# Patient Record
Sex: Female | Born: 1945
Health system: Southern US, Community
[De-identification: ages and names within clinical notes are randomized; demographics above are authoritative.]

## PROBLEM LIST (undated history)

## (undated) DIAGNOSIS — G459 Transient cerebral ischemic attack, unspecified: Secondary | ICD-10-CM

## (undated) DIAGNOSIS — I10 Essential (primary) hypertension: Secondary | ICD-10-CM

## (undated) DIAGNOSIS — Z7982 Long term (current) use of aspirin: Secondary | ICD-10-CM

## (undated) DIAGNOSIS — K589 Irritable bowel syndrome without diarrhea: Secondary | ICD-10-CM

## (undated) DIAGNOSIS — K582 Mixed irritable bowel syndrome: Secondary | ICD-10-CM

## (undated) DIAGNOSIS — E785 Hyperlipidemia, unspecified: Secondary | ICD-10-CM

## (undated) DIAGNOSIS — E669 Obesity, unspecified: Secondary | ICD-10-CM

## (undated) DIAGNOSIS — M1990 Unspecified osteoarthritis, unspecified site: Secondary | ICD-10-CM

## (undated) DIAGNOSIS — K635 Polyp of colon: Secondary | ICD-10-CM

## (undated) DIAGNOSIS — N281 Cyst of kidney, acquired: Secondary | ICD-10-CM

## (undated) DIAGNOSIS — E279 Disorder of adrenal gland, unspecified: Secondary | ICD-10-CM

## (undated) DIAGNOSIS — J449 Chronic obstructive pulmonary disease, unspecified: Secondary | ICD-10-CM

## (undated) DIAGNOSIS — D649 Anemia, unspecified: Secondary | ICD-10-CM

## (undated) DIAGNOSIS — M5135 Other intervertebral disc degeneration, thoracolumbar region: Secondary | ICD-10-CM

## (undated) DIAGNOSIS — C50912 Malignant neoplasm of unspecified site of left female breast: Secondary | ICD-10-CM

## (undated) DIAGNOSIS — Z87442 Personal history of urinary calculi: Secondary | ICD-10-CM

## (undated) DIAGNOSIS — I272 Pulmonary hypertension, unspecified: Secondary | ICD-10-CM

## (undated) DIAGNOSIS — Z923 Personal history of irradiation: Secondary | ICD-10-CM

## (undated) DIAGNOSIS — R600 Localized edema: Secondary | ICD-10-CM

## (undated) DIAGNOSIS — G4733 Obstructive sleep apnea (adult) (pediatric): Secondary | ICD-10-CM

## (undated) DIAGNOSIS — K5790 Diverticulosis of intestine, part unspecified, without perforation or abscess without bleeding: Secondary | ICD-10-CM

## (undated) DIAGNOSIS — G4700 Insomnia, unspecified: Secondary | ICD-10-CM

## (undated) DIAGNOSIS — I700 Atherosclerosis of aorta: Secondary | ICD-10-CM

## (undated) DIAGNOSIS — R0600 Dyspnea, unspecified: Secondary | ICD-10-CM

## (undated) DIAGNOSIS — K4090 Unilateral inguinal hernia, without obstruction or gangrene, not specified as recurrent: Secondary | ICD-10-CM

## (undated) DIAGNOSIS — C50919 Malignant neoplasm of unspecified site of unspecified female breast: Secondary | ICD-10-CM

## (undated) DIAGNOSIS — E119 Type 2 diabetes mellitus without complications: Secondary | ICD-10-CM

## (undated) DIAGNOSIS — M51369 Other intervertebral disc degeneration, lumbar region without mention of lumbar back pain or lower extremity pain: Secondary | ICD-10-CM

## (undated) DIAGNOSIS — I350 Nonrheumatic aortic (valve) stenosis: Secondary | ICD-10-CM

## (undated) DIAGNOSIS — D3502 Benign neoplasm of left adrenal gland: Secondary | ICD-10-CM

## (undated) DIAGNOSIS — M5136 Other intervertebral disc degeneration, lumbar region: Secondary | ICD-10-CM

## (undated) DIAGNOSIS — K760 Fatty (change of) liver, not elsewhere classified: Secondary | ICD-10-CM

## (undated) DIAGNOSIS — G4730 Sleep apnea, unspecified: Secondary | ICD-10-CM

## (undated) HISTORY — DX: Malignant neoplasm of unspecified site of unspecified female breast: C50.919

## (undated) HISTORY — PX: CHOLECYSTECTOMY: SHX55

## (undated) HISTORY — DX: Essential (primary) hypertension: I10

## (undated) HISTORY — DX: Type 2 diabetes mellitus without complications: E11.9

## (undated) HISTORY — DX: Unspecified osteoarthritis, unspecified site: M19.90

## (undated) HISTORY — DX: Hyperlipidemia, unspecified: E78.5

## (undated) HISTORY — DX: Chronic obstructive pulmonary disease, unspecified: J44.9

## (undated) HISTORY — DX: Malignant neoplasm of unspecified site of left female breast: C50.912

## (undated) HISTORY — DX: Transient cerebral ischemic attack, unspecified: G45.9

## (undated) HISTORY — PX: KNEE ARTHROSCOPY W/ MENISCAL REPAIR: SHX1877

## (undated) HISTORY — PX: ABDOMINAL HYSTERECTOMY: SHX81

## (undated) HISTORY — PX: COLONOSCOPY W/ POLYPECTOMY: SHX1380

## (undated) HISTORY — PX: TONSILLECTOMY: SUR1361

---

## 2005-03-02 ENCOUNTER — Ambulatory Visit: Payer: Self-pay | Admitting: Rheumatology

## 2005-04-10 ENCOUNTER — Ambulatory Visit: Payer: Self-pay | Admitting: Unknown Physician Specialty

## 2005-07-22 ENCOUNTER — Ambulatory Visit: Payer: Self-pay | Admitting: Family Medicine

## 2006-06-24 ENCOUNTER — Ambulatory Visit: Payer: Self-pay | Admitting: Unknown Physician Specialty

## 2006-07-14 ENCOUNTER — Ambulatory Visit: Payer: Self-pay | Admitting: Unknown Physician Specialty

## 2006-08-12 ENCOUNTER — Ambulatory Visit: Payer: Self-pay | Admitting: Family Medicine

## 2006-09-30 ENCOUNTER — Ambulatory Visit: Payer: Self-pay | Admitting: Unknown Physician Specialty

## 2007-06-14 DIAGNOSIS — G459 Transient cerebral ischemic attack, unspecified: Secondary | ICD-10-CM

## 2007-06-14 HISTORY — DX: Transient cerebral ischemic attack, unspecified: G45.9

## 2007-06-27 ENCOUNTER — Other Ambulatory Visit: Payer: Self-pay

## 2007-06-27 ENCOUNTER — Inpatient Hospital Stay: Payer: Self-pay | Admitting: Internal Medicine

## 2008-02-22 ENCOUNTER — Ambulatory Visit: Payer: Self-pay | Admitting: Family Medicine

## 2008-04-03 ENCOUNTER — Emergency Department: Payer: Self-pay | Admitting: Unknown Physician Specialty

## 2008-12-05 ENCOUNTER — Ambulatory Visit: Payer: Self-pay | Admitting: Unknown Physician Specialty

## 2009-02-22 ENCOUNTER — Ambulatory Visit: Payer: Self-pay | Admitting: Family Medicine

## 2009-12-14 DIAGNOSIS — C50919 Malignant neoplasm of unspecified site of unspecified female breast: Secondary | ICD-10-CM

## 2009-12-14 DIAGNOSIS — C50912 Malignant neoplasm of unspecified site of left female breast: Secondary | ICD-10-CM

## 2009-12-14 HISTORY — DX: Malignant neoplasm of unspecified site of left female breast: C50.912

## 2009-12-14 HISTORY — PX: BREAST EXCISIONAL BIOPSY: SUR124

## 2009-12-14 HISTORY — DX: Malignant neoplasm of unspecified site of unspecified female breast: C50.919

## 2010-03-05 ENCOUNTER — Ambulatory Visit: Payer: Self-pay | Admitting: Family Medicine

## 2010-03-11 ENCOUNTER — Ambulatory Visit: Payer: Self-pay | Admitting: Family Medicine

## 2010-04-09 ENCOUNTER — Ambulatory Visit: Payer: Self-pay | Admitting: Surgery

## 2010-04-13 ENCOUNTER — Ambulatory Visit: Payer: Self-pay | Admitting: Internal Medicine

## 2010-04-16 ENCOUNTER — Ambulatory Visit: Payer: Self-pay | Admitting: Surgery

## 2010-04-22 ENCOUNTER — Ambulatory Visit: Payer: Self-pay | Admitting: Surgery

## 2010-05-08 ENCOUNTER — Ambulatory Visit: Payer: Self-pay | Admitting: Internal Medicine

## 2010-05-14 ENCOUNTER — Ambulatory Visit: Payer: Self-pay | Admitting: Internal Medicine

## 2010-06-13 ENCOUNTER — Ambulatory Visit: Payer: Self-pay | Admitting: Internal Medicine

## 2010-07-14 ENCOUNTER — Ambulatory Visit: Payer: Self-pay | Admitting: Internal Medicine

## 2010-08-14 ENCOUNTER — Ambulatory Visit: Payer: Self-pay | Admitting: Internal Medicine

## 2010-08-14 LAB — CANCER ANTIGEN 27.29: CA 27.29: 25.5 U/mL (ref 0.0–38.6)

## 2010-09-13 ENCOUNTER — Ambulatory Visit: Payer: Self-pay | Admitting: Internal Medicine

## 2010-09-19 ENCOUNTER — Inpatient Hospital Stay: Payer: Self-pay | Admitting: Internal Medicine

## 2010-12-12 ENCOUNTER — Ambulatory Visit: Payer: Self-pay | Admitting: Internal Medicine

## 2010-12-13 LAB — CANCER ANTIGEN 27.29: CA 27.29: 21.1 U/mL (ref 0.0–38.6)

## 2010-12-14 ENCOUNTER — Ambulatory Visit: Payer: Self-pay | Admitting: Internal Medicine

## 2011-02-05 ENCOUNTER — Ambulatory Visit: Payer: Self-pay | Admitting: Internal Medicine

## 2011-02-12 ENCOUNTER — Ambulatory Visit: Payer: Self-pay | Admitting: Internal Medicine

## 2011-03-24 ENCOUNTER — Ambulatory Visit: Payer: Self-pay | Admitting: Surgery

## 2011-04-10 ENCOUNTER — Ambulatory Visit: Payer: Self-pay | Admitting: Internal Medicine

## 2011-04-11 LAB — CANCER ANTIGEN 27.29: CA 27.29: 27.2 U/mL (ref 0.0–38.6)

## 2011-04-14 ENCOUNTER — Ambulatory Visit: Payer: Self-pay | Admitting: Internal Medicine

## 2011-05-15 ENCOUNTER — Ambulatory Visit: Payer: Self-pay

## 2011-05-15 ENCOUNTER — Ambulatory Visit: Payer: Self-pay | Admitting: Internal Medicine

## 2011-08-06 ENCOUNTER — Ambulatory Visit: Payer: Self-pay | Admitting: Internal Medicine

## 2011-08-07 LAB — CANCER ANTIGEN 27.29: CA 27.29: 22.1 U/mL (ref 0.0–38.6)

## 2011-08-15 ENCOUNTER — Ambulatory Visit: Payer: Self-pay | Admitting: Internal Medicine

## 2011-09-28 ENCOUNTER — Ambulatory Visit: Payer: Self-pay | Admitting: Internal Medicine

## 2012-02-09 ENCOUNTER — Ambulatory Visit: Payer: Self-pay | Admitting: Internal Medicine

## 2012-02-09 LAB — COMPREHENSIVE METABOLIC PANEL
Albumin: 4.2 g/dL (ref 3.4–5.0)
Anion Gap: 9 (ref 7–16)
BUN: 17 mg/dL (ref 7–18)
Bilirubin,Total: 0.7 mg/dL (ref 0.2–1.0)
Chloride: 103 mmol/L (ref 98–107)
Co2: 31 mmol/L (ref 21–32)
Creatinine: 1.16 mg/dL (ref 0.60–1.30)
EGFR (African American): 60 — ABNORMAL LOW
EGFR (Non-African Amer.): 49 — ABNORMAL LOW
Glucose: 104 mg/dL — ABNORMAL HIGH (ref 65–99)
Potassium: 3.7 mmol/L (ref 3.5–5.1)
SGOT(AST): 19 U/L (ref 15–37)
Sodium: 143 mmol/L (ref 136–145)
Total Protein: 6.9 g/dL (ref 6.4–8.2)

## 2012-02-09 LAB — CBC CANCER CENTER
Basophil #: 0 x10 3/mm (ref 0.0–0.1)
Basophil %: 0.4 %
Eosinophil %: 1.3 %
HCT: 38.4 % (ref 35.0–47.0)
HGB: 12.9 g/dL (ref 12.0–16.0)
Lymphocyte #: 1 x10 3/mm (ref 1.0–3.6)
MCH: 30.5 pg (ref 26.0–34.0)
MCHC: 33.7 g/dL (ref 32.0–36.0)
MCV: 90 fL (ref 80–100)
Monocyte #: 0.4 x10 3/mm (ref 0.0–0.7)
Neutrophil #: 4.1 x10 3/mm (ref 1.4–6.5)
Neutrophil %: 73 %
RBC: 4.24 10*6/uL (ref 3.80–5.20)

## 2012-02-10 LAB — CANCER ANTIGEN 27.29: CA 27.29: 25.6 U/mL (ref 0.0–38.6)

## 2012-02-12 ENCOUNTER — Ambulatory Visit: Payer: Self-pay | Admitting: Internal Medicine

## 2012-03-24 ENCOUNTER — Ambulatory Visit: Payer: Self-pay | Admitting: Internal Medicine

## 2012-08-05 ENCOUNTER — Ambulatory Visit: Payer: Self-pay | Admitting: Internal Medicine

## 2012-08-09 LAB — CBC CANCER CENTER
Basophil #: 0 x10 3/mm (ref 0.0–0.1)
Eosinophil #: 0.1 x10 3/mm (ref 0.0–0.7)
MCHC: 32.9 g/dL (ref 32.0–36.0)
MCV: 90 fL (ref 80–100)
Monocyte #: 0.4 x10 3/mm (ref 0.2–0.9)
Monocyte %: 8.8 %
Neutrophil #: 3.3 x10 3/mm (ref 1.4–6.5)
Neutrophil %: 70.1 %
Platelet: 235 x10 3/mm (ref 150–440)
RDW: 15.1 % — ABNORMAL HIGH (ref 11.5–14.5)
WBC: 4.7 x10 3/mm (ref 3.6–11.0)

## 2012-08-09 LAB — HEPATIC FUNCTION PANEL A (ARMC)
Albumin: 4.1 g/dL (ref 3.4–5.0)
Alkaline Phosphatase: 76 U/L (ref 50–136)
Bilirubin,Total: 0.7 mg/dL (ref 0.2–1.0)
SGOT(AST): 13 U/L — ABNORMAL LOW (ref 15–37)
SGPT (ALT): 24 U/L (ref 12–78)

## 2012-08-09 LAB — CREATININE, SERUM: EGFR (Non-African Amer.): 52 — ABNORMAL LOW

## 2012-08-10 LAB — CANCER ANTIGEN 27.29: CA 27.29: 32.8 U/mL (ref 0.0–38.6)

## 2012-08-14 ENCOUNTER — Ambulatory Visit: Payer: Self-pay | Admitting: Internal Medicine

## 2012-09-13 ENCOUNTER — Ambulatory Visit: Payer: Self-pay | Admitting: Internal Medicine

## 2013-01-06 ENCOUNTER — Ambulatory Visit: Payer: Self-pay | Admitting: Unknown Physician Specialty

## 2013-01-09 LAB — PATHOLOGY REPORT

## 2013-01-14 ENCOUNTER — Ambulatory Visit: Payer: Self-pay | Admitting: Internal Medicine

## 2013-02-10 LAB — CBC CANCER CENTER
Basophil #: 0 x10 3/mm (ref 0.0–0.1)
Basophil %: 0.4 %
Eosinophil #: 0.1 x10 3/mm (ref 0.0–0.7)
Eosinophil %: 1.9 %
HCT: 40.6 % (ref 35.0–47.0)
HGB: 13.7 g/dL (ref 12.0–16.0)
Lymphocyte #: 1.1 x10 3/mm (ref 1.0–3.6)
Lymphocyte %: 21.7 %
MCH: 30.5 pg (ref 26.0–34.0)
MCHC: 33.9 g/dL (ref 32.0–36.0)
MCV: 90 fL (ref 80–100)
Monocyte #: 0.5 x10 3/mm (ref 0.2–0.9)
Monocyte %: 9.9 %
Platelet: 238 x10 3/mm (ref 150–440)
RDW: 14.3 % (ref 11.5–14.5)

## 2013-02-10 LAB — HEPATIC FUNCTION PANEL A (ARMC)
Albumin: 3.8 g/dL (ref 3.4–5.0)
Bilirubin,Total: 0.6 mg/dL (ref 0.2–1.0)
SGOT(AST): 14 U/L — ABNORMAL LOW (ref 15–37)
Total Protein: 6.9 g/dL (ref 6.4–8.2)

## 2013-02-10 LAB — CREATININE, SERUM
Creatinine: 1.16 mg/dL (ref 0.60–1.30)
EGFR (African American): 56 — ABNORMAL LOW
EGFR (Non-African Amer.): 48 — ABNORMAL LOW

## 2013-02-11 ENCOUNTER — Ambulatory Visit: Payer: Self-pay | Admitting: Internal Medicine

## 2013-02-13 LAB — CANCER ANTIGEN 27.29: CA 27.29: 28.8 U/mL (ref 0.0–38.6)

## 2013-04-03 ENCOUNTER — Ambulatory Visit: Payer: Self-pay | Admitting: Internal Medicine

## 2013-04-13 ENCOUNTER — Ambulatory Visit: Payer: Self-pay | Admitting: Internal Medicine

## 2013-05-14 ENCOUNTER — Ambulatory Visit: Payer: Self-pay | Admitting: Internal Medicine

## 2013-07-25 ENCOUNTER — Ambulatory Visit: Payer: Self-pay | Admitting: Internal Medicine

## 2013-08-11 LAB — CBC CANCER CENTER
Basophil #: 0 x10 3/mm (ref 0.0–0.1)
Eosinophil %: 3.3 %
HCT: 40.5 % (ref 35.0–47.0)
HGB: 13.8 g/dL (ref 12.0–16.0)
Lymphocyte #: 1 x10 3/mm (ref 1.0–3.6)
Lymphocyte %: 19.3 %
MCH: 29.7 pg (ref 26.0–34.0)
MCV: 87 fL (ref 80–100)
Neutrophil #: 3.6 x10 3/mm (ref 1.4–6.5)
Neutrophil %: 67.4 %
Platelet: 265 x10 3/mm (ref 150–440)
RBC: 4.64 10*6/uL (ref 3.80–5.20)
RDW: 13.9 % (ref 11.5–14.5)

## 2013-08-11 LAB — HEPATIC FUNCTION PANEL A (ARMC)
Alkaline Phosphatase: 67 U/L (ref 50–136)
Bilirubin,Total: 0.5 mg/dL (ref 0.2–1.0)
Total Protein: 6.9 g/dL (ref 6.4–8.2)

## 2013-08-11 LAB — CREATININE, SERUM
Creatinine: 1.34 mg/dL — ABNORMAL HIGH (ref 0.60–1.30)
EGFR (Non-African Amer.): 40 — ABNORMAL LOW

## 2013-08-14 ENCOUNTER — Ambulatory Visit: Payer: Self-pay | Admitting: Internal Medicine

## 2013-08-16 LAB — CANCER ANTIGEN 27.29: CA 27.29: 18.6 U/mL (ref 0.0–38.6)

## 2013-12-10 ENCOUNTER — Observation Stay: Payer: Self-pay | Admitting: Student

## 2013-12-10 LAB — URINALYSIS, COMPLETE
Bilirubin,UR: NEGATIVE
Blood: NEGATIVE
Glucose,UR: NEGATIVE mg/dL (ref 0–75)
Ketone: NEGATIVE
Nitrite: NEGATIVE
Ph: 7 (ref 4.5–8.0)
RBC,UR: NONE SEEN /HPF (ref 0–5)
Specific Gravity: 1.005 (ref 1.003–1.030)
Squamous Epithelial: 1
WBC UR: <1 /HPF (ref 0–5)

## 2013-12-10 LAB — CBC
HCT: 41.9 % (ref 35.0–47.0)
HGB: 14 g/dL (ref 12.0–16.0)
MCH: 29.7 pg (ref 26.0–34.0)
Platelet: 252 10*3/uL (ref 150–440)
RDW: 14.6 % — ABNORMAL HIGH (ref 11.5–14.5)
WBC: 5.9 10*3/uL (ref 3.6–11.0)

## 2013-12-10 LAB — TROPONIN I
Troponin-I: <0.02 ng/mL
Troponin-I: <0.02 ng/mL

## 2013-12-10 LAB — BASIC METABOLIC PANEL
Anion Gap: 5 — ABNORMAL LOW (ref 7–16)
Creatinine: 1.04 mg/dL (ref 0.60–1.30)
EGFR (African American): >60
Osmolality: 282 (ref 275–301)

## 2013-12-11 LAB — BASIC METABOLIC PANEL
Calcium, Total: 9.8 mg/dL (ref 8.5–10.1)
Co2: 26 mmol/L (ref 21–32)
Creatinine: 0.84 mg/dL (ref 0.60–1.30)
EGFR (African American): >60
EGFR (Non-African Amer.): >60
Glucose: 117 mg/dL — ABNORMAL HIGH (ref 65–99)
Osmolality: 283 (ref 275–301)
Potassium: 4.2 mmol/L (ref 3.5–5.1)
Sodium: 139 mmol/L (ref 136–145)

## 2013-12-11 LAB — TSH: Thyroid Stimulating Horm: 2.29 u[IU]/mL

## 2013-12-11 LAB — LIPID PANEL
HDL Cholesterol: 74 mg/dL — ABNORMAL HIGH (ref 40–60)
Ldl Cholesterol, Calc: 102 mg/dL — ABNORMAL HIGH (ref 0–100)
Triglycerides: 92 mg/dL (ref 0–200)

## 2014-01-23 DIAGNOSIS — M1990 Unspecified osteoarthritis, unspecified site: Secondary | ICD-10-CM | POA: Insufficient documentation

## 2014-01-23 DIAGNOSIS — K5790 Diverticulosis of intestine, part unspecified, without perforation or abscess without bleeding: Secondary | ICD-10-CM | POA: Insufficient documentation

## 2014-01-23 DIAGNOSIS — Z853 Personal history of malignant neoplasm of breast: Secondary | ICD-10-CM | POA: Insufficient documentation

## 2014-01-23 DIAGNOSIS — E119 Type 2 diabetes mellitus without complications: Secondary | ICD-10-CM | POA: Insufficient documentation

## 2014-01-23 DIAGNOSIS — Z860101 Personal history of adenomatous and serrated colon polyps: Secondary | ICD-10-CM | POA: Insufficient documentation

## 2014-01-23 DIAGNOSIS — R198 Other specified symptoms and signs involving the digestive system and abdomen: Secondary | ICD-10-CM | POA: Insufficient documentation

## 2014-01-23 DIAGNOSIS — E785 Hyperlipidemia, unspecified: Secondary | ICD-10-CM | POA: Insufficient documentation

## 2014-01-23 DIAGNOSIS — Z86010 Personal history of colonic polyps: Secondary | ICD-10-CM | POA: Insufficient documentation

## 2014-01-23 DIAGNOSIS — I10 Essential (primary) hypertension: Secondary | ICD-10-CM | POA: Insufficient documentation

## 2014-01-23 DIAGNOSIS — G4730 Sleep apnea, unspecified: Secondary | ICD-10-CM | POA: Insufficient documentation

## 2014-01-23 DIAGNOSIS — D649 Anemia, unspecified: Secondary | ICD-10-CM | POA: Insufficient documentation

## 2014-02-12 ENCOUNTER — Ambulatory Visit: Payer: Self-pay | Admitting: Internal Medicine

## 2014-02-12 LAB — CBC CANCER CENTER
Basophil #: 0 x10 3/mm (ref 0.0–0.1)
Basophil %: 0.5 %
Eosinophil #: 0.1 x10 3/mm (ref 0.0–0.7)
Eosinophil %: 2.4 %
HCT: 44.6 % (ref 35.0–47.0)
HGB: 14.5 g/dL (ref 12.0–16.0)
Lymphocyte #: 1.2 x10 3/mm (ref 1.0–3.6)
Lymphocyte %: 22.6 %
MCH: 29.4 pg (ref 26.0–34.0)
MCHC: 32.6 g/dL (ref 32.0–36.0)
MCV: 90 fL (ref 80–100)
MONOS PCT: 9.6 %
Monocyte #: 0.5 x10 3/mm (ref 0.2–0.9)
NEUTROS ABS: 3.5 x10 3/mm (ref 1.4–6.5)
NEUTROS PCT: 64.9 %
Platelet: 231 x10 3/mm (ref 150–440)
RBC: 4.94 10*6/uL (ref 3.80–5.20)
RDW: 13.8 % (ref 11.5–14.5)
WBC: 5.4 x10 3/mm (ref 3.6–11.0)

## 2014-02-12 LAB — HEPATIC FUNCTION PANEL A (ARMC)
AST: 13 U/L — AB (ref 15–37)
Albumin: 4 g/dL (ref 3.4–5.0)
Alkaline Phosphatase: 62 U/L
BILIRUBIN DIRECT: 0.1 mg/dL (ref 0.00–0.20)
BILIRUBIN TOTAL: 0.5 mg/dL (ref 0.2–1.0)
SGPT (ALT): 23 U/L (ref 12–78)
Total Protein: 7.3 g/dL (ref 6.4–8.2)

## 2014-02-12 LAB — CREATININE, SERUM
CREATININE: 1.18 mg/dL (ref 0.60–1.30)
EGFR (African American): 54 — ABNORMAL LOW
EGFR (Non-African Amer.): 47 — ABNORMAL LOW

## 2014-02-13 LAB — CANCER ANTIGEN 27.29: CA 27.29: 26.7 U/mL (ref 0.0–38.6)

## 2014-03-14 ENCOUNTER — Ambulatory Visit: Payer: Self-pay | Admitting: Internal Medicine

## 2014-04-04 ENCOUNTER — Ambulatory Visit: Payer: Self-pay | Admitting: Internal Medicine

## 2014-04-19 DIAGNOSIS — M7062 Trochanteric bursitis, left hip: Secondary | ICD-10-CM | POA: Insufficient documentation

## 2014-05-01 ENCOUNTER — Ambulatory Visit: Payer: Self-pay | Admitting: Physical Medicine and Rehabilitation

## 2014-05-17 DIAGNOSIS — R609 Edema, unspecified: Secondary | ICD-10-CM | POA: Insufficient documentation

## 2014-05-17 DIAGNOSIS — R0609 Other forms of dyspnea: Secondary | ICD-10-CM | POA: Insufficient documentation

## 2014-05-21 DIAGNOSIS — M5136 Other intervertebral disc degeneration, lumbar region: Secondary | ICD-10-CM | POA: Insufficient documentation

## 2014-06-08 DIAGNOSIS — M5416 Radiculopathy, lumbar region: Secondary | ICD-10-CM | POA: Insufficient documentation

## 2014-07-31 ENCOUNTER — Ambulatory Visit: Payer: Self-pay | Admitting: Radiation Oncology

## 2014-08-14 ENCOUNTER — Ambulatory Visit: Payer: Self-pay | Admitting: Radiation Oncology

## 2014-12-17 DIAGNOSIS — M62830 Muscle spasm of back: Secondary | ICD-10-CM | POA: Diagnosis not present

## 2014-12-17 DIAGNOSIS — M9903 Segmental and somatic dysfunction of lumbar region: Secondary | ICD-10-CM | POA: Diagnosis not present

## 2014-12-17 DIAGNOSIS — M5136 Other intervertebral disc degeneration, lumbar region: Secondary | ICD-10-CM | POA: Diagnosis not present

## 2014-12-31 DIAGNOSIS — R159 Full incontinence of feces: Secondary | ICD-10-CM | POA: Insufficient documentation

## 2014-12-31 DIAGNOSIS — K582 Mixed irritable bowel syndrome: Secondary | ICD-10-CM | POA: Insufficient documentation

## 2014-12-31 DIAGNOSIS — K580 Irritable bowel syndrome with diarrhea: Secondary | ICD-10-CM | POA: Diagnosis not present

## 2015-02-18 ENCOUNTER — Ambulatory Visit
Admit: 2015-02-18 | Disposition: A | Discharge status: Home / Self Care | Payer: Self-pay | Attending: Internal Medicine | Admitting: Internal Medicine

## 2015-02-18 DIAGNOSIS — M129 Arthropathy, unspecified: Secondary | ICD-10-CM | POA: Diagnosis not present

## 2015-02-18 DIAGNOSIS — G8929 Other chronic pain: Secondary | ICD-10-CM | POA: Diagnosis not present

## 2015-02-18 DIAGNOSIS — Z853 Personal history of malignant neoplasm of breast: Secondary | ICD-10-CM | POA: Diagnosis not present

## 2015-02-18 DIAGNOSIS — Z7982 Long term (current) use of aspirin: Secondary | ICD-10-CM | POA: Diagnosis not present

## 2015-02-18 DIAGNOSIS — Z79899 Other long term (current) drug therapy: Secondary | ICD-10-CM | POA: Diagnosis not present

## 2015-02-18 DIAGNOSIS — M545 Low back pain: Secondary | ICD-10-CM | POA: Diagnosis not present

## 2015-02-18 DIAGNOSIS — I10 Essential (primary) hypertension: Secondary | ICD-10-CM | POA: Diagnosis not present

## 2015-02-18 DIAGNOSIS — E785 Hyperlipidemia, unspecified: Secondary | ICD-10-CM | POA: Diagnosis not present

## 2015-02-18 DIAGNOSIS — R0789 Other chest pain: Secondary | ICD-10-CM | POA: Diagnosis not present

## 2015-03-15 ENCOUNTER — Ambulatory Visit
Admit: 2015-03-15 | Disposition: A | Discharge status: Home / Self Care | Payer: Self-pay | Attending: Internal Medicine | Admitting: Internal Medicine

## 2015-04-02 DIAGNOSIS — K580 Irritable bowel syndrome with diarrhea: Secondary | ICD-10-CM | POA: Diagnosis not present

## 2015-04-02 DIAGNOSIS — I10 Essential (primary) hypertension: Secondary | ICD-10-CM | POA: Diagnosis not present

## 2015-04-02 DIAGNOSIS — E119 Type 2 diabetes mellitus without complications: Secondary | ICD-10-CM | POA: Diagnosis not present

## 2015-04-02 DIAGNOSIS — E785 Hyperlipidemia, unspecified: Secondary | ICD-10-CM | POA: Diagnosis not present

## 2015-04-05 NOTE — H&P (Signed)
PATIENT NAME:  Jennifer Maddox, Jennifer Maddox MR#:  [id number] DATE OF BIRTH:  1946-06-10  DATE OF ADMISSION:  12/10/2013  PRIMARY CARE PHYSICIAN:  Irven Easterly. Kary Kos, MD  REFERRING PHYSICIAN:  Baird Cancer. Jacqualine Code, MD  CHIEF COMPLAINT: Chest pain yesterday and today.   HISTORY OF PRESENT ILLNESS: A 69 year old Caucasian female with a history of hypertension, diabetes, hyperlipidemia, TIA, breast cancer who presented to the ED with chest pain which is on the left side, sharp, intermittent while she was working on a Christmas tree decorations yesterday. The patient's husband said the patient lifted about 30 pounds of decorations and then later developed chest pain on the left side. The patient was fine last night but developed chest pain again today with nausea, vomiting once, also she has palpitations but she denies any headache or dizziness She did have weakness and feels dehydrated and but denies any other symptoms.   PAST MEDICAL HISTORY: Hypertension, diabetes, hyperlipidemia, TIA, breast cancer, arthritis.   PAST SURGICAL HISTORY: Hysterectomy, tonsillectomy, cholecystectomy, carpal tunnel surgery, partial mastectomy.   SOCIAL HISTORY: No smoking, alcohol drinking or illicit drugs.   FAMILY HISTORY: Negative for breast cancer. Father died of MI at 8.   REVIEW OF SYSTEMS: CONSTITUTIONAL: The patient denies any fever or chills. No headache or dizziness but has generalized weakness.  EYES: No double vision or blurry vision.  ENT: No postnasal drip, slurred speech or dysphagia.  CARDIOVASCULAR: Has chest pain and palpitations. No orthopnea, nocturnal dyspnea or leg edema.  PULMONARY: No cough, sputum, shortness of breath or hemoptysis.  GASTROINTESTINAL: No abdominal pain but had some nausea, vomiting. No diarrhea. No melena or bloody stools.  GENITOURINARY: No dysuria, hematuria or incontinence.  SKIN: No rash or jaundice.  ENDOCRINE: No polyuria, polydipsia, heat or cold intolerance.  HEMATOLOGY: No easy  bruising or bleeding.  NEUROLOGY: No syncope, loss of consciousness or seizure.   ALLERGIES: ALEVE.  HOME MEDICATIONS: 1.  Tums 500 mg p.o. t.i.d.  2.  Trazodone 100 mg p.o. at bedtime.  3.  Ropinirole 0.25 mg p.o. 3 times a day.  4.  Probiotic formula 1 cap once a day.  5.  One-a-day multivitamin 1 tab once a day.  6.  Metformin 1000 mg p.o. once a day.  7.  Lisinopril 20 mg p.o. daily.  8.  Lipitor 40 mg p.o. at bedtime.  9.  HCTZ/triamterene 25 mg/37.5 mg 1 tablet once a day.  10.  Exemestane  25 mg p.o. once a day.  11.  Clobetasol 0.05% topical apply topically to affected area 4 nights per week.  12.  Calcium plus D 600 mg/200 units 2 tablets once a day.  13.  Aspirin 81 mg p.o. daily.  14.  Advair Diskus 250/50 mcg inhalation powder 1 puff twice a day p.r.n.  15.  Acyclovir 400 mg p.o. b.i.d.   PHYSICAL EXAMINATION: VITAL SIGNS: Temperature 98.1, blood pressure 145/68, pulse 58, respirations 20, O2  saturation 95% on room air.  GENERAL: The patient is alert, awake, oriented, in no acute distress.  HEENT: Pupils round, equal and reactive to light and accommodation. Moist oral mucosa. Clear oropharynx.  NECK: Supple. No JVD or carotid bruit. No lymphadenopathy. No thyromegaly.  CARDIOVASCULAR: S1, S2. Regular rate, rhythm. No murmurs or gallops.  PULMONARY: Bilateral air entry. No wheezing or rales. No use of accessory muscles to breathe.  ABDOMEN: Soft. No distention. No tenderness. No organomegaly. Bowel sounds present. Obese.  EXTREMITIES: No edema, clubbing or cyanosis. No calf tenderness. Bilateral pedal pulses  present.  SKIN: No rash or jaundice.  NEUROLOGIC: A and O x 3. No focal deficit. Power 5/5. Sensation intact.   LABORATORY, DIAGNOSTIC AND RADIOLOGICAL DATA:  Urinalysis is negative. Chest x-ray no active disease. Troponin less than 0.02. CBC in normal range. Glucose 110, BUN 22, creatinine 1.04, electrolytes were normal. EKG showed normal sinus rhythm at 68 BPM.    IMPRESSIONS: 1.  Chest pain, rule out acute coronary syndrome.  2.  Dehydration.  3.  Hypertension.  4.  Diabetes.  5.  Hyperlipidemia.  6.  Obesity.   PLAN OF TREATMENT: 1.  The patient will be placed for observation. We will continue telemonitor. The patient was given aspirin 325 mg in ED, we will continue. Also, we will continue statin and lisinopril and we will give Lopressor. Get a stress test.  2.  For diabetes, we will start sliding scale. Hold metformin and check hemoglobin A1c.  3.  For dehydration, we will start normal saline IV. Follow up BMP.   Discussed the patient's condition and plan of treatment with the patient, the patient's daughter and patient's husband.   TIME SPENT:  About 45 minutes.    ____________________________ Demetrios Loll, MD qc:cs D: 12/10/2013 13:41:33 ET T: 12/10/2013 14:26:43 ET JOB#: [id number]  cc: Demetrios Loll, MD, <Dictator> Demetrios Loll MD ELECTRONICALLY SIGNED 12/11/2013 12:23

## 2015-04-06 NOTE — Discharge Summary (Signed)
PATIENT NAME:  Jennifer Maddox, Jennifer Maddox MR#:  [id number] DATE OF BIRTH:  [birth year]  DATE OF ADMISSION:  12/10/2013 DATE OF DISCHARGE:  12/11/2013  PRIMARY CARE PHYSICIAN: Dr. Kary Kos   DISCHARGE DIAGNOSES: 1.  Chest pain, likely noncardiac.  2.  Hypertension.  3.  Diabetes.  4.  Hyperlipidemia.  5.  History of transient ischemic attack.  6.  History of breast cancer.  7.  History of arthritis.  8.  History of chronic obstructive pulmonary disease.   DISCHARGE MEDICATIONS: 1.  Advair 250/50 mcg 1 puff 2 times a day. 2.  Metformin 1000 mg once a day. 3.  Tums 500 mg chewable 3 times a day as needed. 4.  Acyclovir 400 mg 2 times a day as needed for cold sores. 5.  Trazodone 100 mg once a day at bedtime. 6.  Aspirin 81 mg daily. 7.  Clobetasol 0.05% topical ointment applied topically every other day. 8.  Exemestane 25 mg oral tab once a day. 9.  Hydrochlorothiazide/triamterene 25/37.5 mg once a day in the morning. 10.  Lisinopril 20 mg once a day. 11.  One-A-Day VitaCraves multivitamin with minerals once a day. 12.  Pravastatin 40 mg daily. 13.  Gabapentin 300 mg 1 to 2 tabs once a day at bedtime.   DIET: Low-sodium, low fat, ADA diet.   ACTIVITY: As tolerated.   DISCHARGE FOLLOWUP: Please follow with PCP within 1 to 2 weeks.   DISPOSITION: Home.   SIGNIFICANT LABS AND IMAGING: Troponins were negative x3. LDL 102. Magnesium 1.9. Hemoglobin A1c was 6.3. HDL 74. TSH 2.29.  Stress test: Low risk scan with EF of 56%. No artifact noted. No significant wall motion abnormalities noted.   HISTORY OF PRESENT ILLNESS AND HOSPITAL COURSE: For full details of H and P, please see the dictation on December 28th by Dr. Bridgett Larsson, but briefly this is a pleasant 69 year old female who was unloading some Christmas tree decorations the day before admission and did lift a heavy box. Later developed chest pain on the left side without any radiation. The next day the patient had bouts of nausea and did vomit  once. She felt palpitations. She therefore came into the hospital and given multiple risk factors the patient was admitted for observation and ruling out acute coronary syndrome. The patient was ruled out with cyclic cardiac markers and underwent a stress test, which was low risk. She has had no further chest pain. The etiology of chest pain is unclear, but I doubt this is acute coronary syndrome and at this point she will be discharged with outpatient follow-up.   The patient is FULL code.  TOTAL TIME SPENT: 33 minutes. ____________________________ Vivien Presto, MD sa:sb D: 12/12/2013 13:13:24 ET T: 12/12/2013 13:52:08 ET JOB#: [id number]  cc: Vivien Presto, MD, <Dictator> Irven Easterly. Kary Kos, MD Vivien Presto MD ELECTRONICALLY SIGNED 12/26/2013 11:19

## 2015-04-12 ENCOUNTER — Ambulatory Visit
Admit: 2015-04-12 | Disposition: A | Discharge status: Home / Self Care | Payer: Self-pay | Attending: Family Medicine | Admitting: Family Medicine

## 2015-04-12 DIAGNOSIS — R928 Other abnormal and inconclusive findings on diagnostic imaging of breast: Secondary | ICD-10-CM | POA: Diagnosis not present

## 2015-04-12 DIAGNOSIS — Z923 Personal history of irradiation: Secondary | ICD-10-CM | POA: Diagnosis not present

## 2015-04-12 DIAGNOSIS — Z853 Personal history of malignant neoplasm of breast: Secondary | ICD-10-CM | POA: Diagnosis not present

## 2015-04-12 DIAGNOSIS — Z9012 Acquired absence of left breast and nipple: Secondary | ICD-10-CM | POA: Diagnosis not present

## 2015-06-27 DIAGNOSIS — D485 Neoplasm of uncertain behavior of skin: Secondary | ICD-10-CM | POA: Diagnosis not present

## 2015-06-27 DIAGNOSIS — L821 Other seborrheic keratosis: Secondary | ICD-10-CM | POA: Diagnosis not present

## 2015-06-27 DIAGNOSIS — L280 Lichen simplex chronicus: Secondary | ICD-10-CM | POA: Diagnosis not present

## 2015-06-27 DIAGNOSIS — L900 Lichen sclerosus et atrophicus: Secondary | ICD-10-CM | POA: Diagnosis not present

## 2015-06-27 DIAGNOSIS — L570 Actinic keratosis: Secondary | ICD-10-CM | POA: Diagnosis not present

## 2015-07-01 DIAGNOSIS — Z853 Personal history of malignant neoplasm of breast: Secondary | ICD-10-CM | POA: Diagnosis not present

## 2015-08-08 DIAGNOSIS — Z0000 Encounter for general adult medical examination without abnormal findings: Secondary | ICD-10-CM | POA: Diagnosis not present

## 2015-08-08 DIAGNOSIS — E119 Type 2 diabetes mellitus without complications: Secondary | ICD-10-CM | POA: Diagnosis not present

## 2015-08-12 DIAGNOSIS — N390 Urinary tract infection, site not specified: Secondary | ICD-10-CM | POA: Diagnosis not present

## 2015-09-16 DIAGNOSIS — H5203 Hypermetropia, bilateral: Secondary | ICD-10-CM | POA: Diagnosis not present

## 2015-09-16 DIAGNOSIS — H5210 Myopia, unspecified eye: Secondary | ICD-10-CM | POA: Diagnosis not present

## 2015-12-04 DIAGNOSIS — E119 Type 2 diabetes mellitus without complications: Secondary | ICD-10-CM | POA: Diagnosis not present

## 2015-12-11 DIAGNOSIS — E119 Type 2 diabetes mellitus without complications: Secondary | ICD-10-CM | POA: Diagnosis not present

## 2015-12-11 DIAGNOSIS — K582 Mixed irritable bowel syndrome: Secondary | ICD-10-CM | POA: Diagnosis not present

## 2015-12-11 DIAGNOSIS — I10 Essential (primary) hypertension: Secondary | ICD-10-CM | POA: Diagnosis not present

## 2015-12-11 DIAGNOSIS — F5104 Psychophysiologic insomnia: Secondary | ICD-10-CM | POA: Diagnosis not present

## 2016-02-18 ENCOUNTER — Encounter: Payer: Self-pay | Admitting: Family Medicine

## 2016-02-18 ENCOUNTER — Inpatient Hospital Stay: Payer: Medicare HMO | Attending: Family Medicine | Admitting: Family Medicine

## 2016-02-18 ENCOUNTER — Inpatient Hospital Stay: Payer: Medicare HMO

## 2016-02-18 VITALS — BP 145/85 | HR 80 | Temp 98.0°F | Wt 194.9 lb

## 2016-02-18 DIAGNOSIS — Z8673 Personal history of transient ischemic attack (TIA), and cerebral infarction without residual deficits: Secondary | ICD-10-CM

## 2016-02-18 DIAGNOSIS — Z853 Personal history of malignant neoplasm of breast: Secondary | ICD-10-CM | POA: Diagnosis present

## 2016-02-18 DIAGNOSIS — C50919 Malignant neoplasm of unspecified site of unspecified female breast: Secondary | ICD-10-CM

## 2016-02-18 DIAGNOSIS — M129 Arthropathy, unspecified: Secondary | ICD-10-CM | POA: Diagnosis not present

## 2016-02-18 DIAGNOSIS — E785 Hyperlipidemia, unspecified: Secondary | ICD-10-CM | POA: Diagnosis not present

## 2016-02-18 DIAGNOSIS — J449 Chronic obstructive pulmonary disease, unspecified: Secondary | ICD-10-CM | POA: Diagnosis not present

## 2016-02-18 DIAGNOSIS — I10 Essential (primary) hypertension: Secondary | ICD-10-CM | POA: Diagnosis not present

## 2016-02-18 DIAGNOSIS — Z7982 Long term (current) use of aspirin: Secondary | ICD-10-CM | POA: Diagnosis not present

## 2016-02-18 DIAGNOSIS — Z79899 Other long term (current) drug therapy: Secondary | ICD-10-CM | POA: Insufficient documentation

## 2016-02-18 DIAGNOSIS — C50912 Malignant neoplasm of unspecified site of left female breast: Secondary | ICD-10-CM | POA: Insufficient documentation

## 2016-02-18 DIAGNOSIS — E119 Type 2 diabetes mellitus without complications: Secondary | ICD-10-CM | POA: Insufficient documentation

## 2016-02-18 LAB — CREATININE, SERUM
Creatinine, Ser: 0.78 mg/dL (ref 0.44–1.00)
GFR calc Af Amer: >60 mL/min (ref >60)
GFR calc non Af Amer: >60 mL/min (ref >60)

## 2016-02-18 LAB — CBC WITH DIFFERENTIAL/PLATELET
BASOS PCT: 1 %
Basophils Absolute: 0 10*3/uL (ref 0–0.1)
Eosinophils Absolute: 0.2 10*3/uL (ref 0–0.7)
Eosinophils Relative: 5 %
HEMATOCRIT: 42.2 % (ref 35.0–47.0)
HEMOGLOBIN: 14.6 g/dL (ref 12.0–16.0)
Lymphocytes Relative: 28 %
Lymphs Abs: 1.1 10*3/uL (ref 1.0–3.6)
MCH: 30.2 pg (ref 26.0–34.0)
MCHC: 34.5 g/dL (ref 32.0–36.0)
MCV: 87.5 fL (ref 80.0–100.0)
MONOS PCT: 9 %
Monocytes Absolute: 0.4 10*3/uL (ref 0.2–0.9)
NEUTROS ABS: 2.4 10*3/uL (ref 1.4–6.5)
NEUTROS PCT: 57 %
Platelets: 227 10*3/uL (ref 150–440)
RBC: 4.82 MIL/uL (ref 3.80–5.20)
RDW: 14.2 % (ref 11.5–14.5)
WBC: 4.1 10*3/uL (ref 3.6–11.0)

## 2016-02-18 LAB — HEPATIC FUNCTION PANEL
ALBUMIN: 4.4 g/dL (ref 3.5–5.0)
ALK PHOS: 51 U/L (ref 38–126)
ALT: 15 U/L (ref 14–54)
AST: 15 U/L (ref 15–41)
BILIRUBIN TOTAL: 0.7 mg/dL (ref 0.3–1.2)
Bilirubin, Direct: 0.1 mg/dL (ref 0.1–0.5)
Indirect Bilirubin: 0.6 mg/dL (ref 0.3–0.9)
Total Protein: 7.2 g/dL (ref 6.5–8.1)

## 2016-02-18 NOTE — Progress Notes (Signed)
[facility]  [phone]  [phone]     MYRISSA CHIPLEY DOB: 1946/05/23  MR#: [id number]  AOZ#:[id number]  Patient Care Team: Maryland Pink, MD as PCP - General (Family Medicine)  CHIEF COMPLAINT:  Chief Complaint  Patient presents with  . Breast Cancer    INTERVAL HISTORY: Patient here today for one year follow up of breast cancer. She feels well today and has no complaints. Her last mammogram was April 2016 reported as Bi-rads 2. She does not wish for follow up in this clinic in a year. She states her primary care physician, Dr Kary Kos, takes care of scheduling her mammograms and does her breast exams.  REVIEW OF SYSTEMS:   Review of Systems  Constitutional: Negative for fever, chills, weight loss, malaise/fatigue and diaphoresis.  HENT: Negative.   Eyes: Negative.   Respiratory: Negative for cough, hemoptysis, sputum production, shortness of breath and wheezing.   Cardiovascular: Negative for chest pain, palpitations, orthopnea, claudication, leg swelling and PND.  Gastrointestinal: Negative for heartburn, nausea, vomiting, abdominal pain, diarrhea, constipation, blood in stool and melena.  Genitourinary: Negative.   Musculoskeletal: Negative.   Skin: Negative.   Neurological: Negative for dizziness, tingling, focal weakness, seizures and weakness.  Endo/Heme/Allergies: Does not bruise/bleed easily.  Psychiatric/Behavioral: Negative for depression. The patient is not nervous/anxious and does not have insomnia.     As per HPI. Otherwise, a complete review of systems is negatve.  ONCOLOGY HISTORY: Oncology History   Stage I, T1 B. N0 M0 infiltrating ductal carcinoma of the left breast s/p partial mastectomy.  Size of tumor was 0.8 cm,  ER/PR positive and HER-2/neu negative, positive LVI. One sentinel lymph node was performed and negative. Oncotype DX recurrence score was      Breast cancer, left breast ([place])   05/14/2010 Initial Diagnosis  Breast cancer, left breast ([facility])    PAST MEDICAL HISTORY: Past Medical History  Diagnosis Date  . Breast cancer ([place])   . Hypertension   . Diabetes mellitus without complication ([place])   . Hyperlipidemia   . TIA (transient ischemic attack)   . Arthritis   . COPD (chronic obstructive pulmonary disease) ([place])   . Breast cancer, left breast ([place]) 02/18/2016    PAST SURGICAL HISTORY: History reviewed. No pertinent past surgical history.  FAMILY HISTORY History reviewed. No pertinent family history.  GYNECOLOGIC HISTORY:  No LMP recorded.     ADVANCED DIRECTIVES:    HEALTH MAINTENANCE: Social History  Substance Use Topics  . Smoking status: Never Smoker   . Smokeless tobacco: None  . Alcohol Use: No     Colonoscopy:  PAP:  Bone density:  Mammogram: 03/2015  Allergies  Allergen Reactions  . Atorvastatin Other (See Comments)    Cramps  . Naproxen Sodium Other (See Comments)    Head and Neck Puritis    Current Outpatient Prescriptions  Medication Sig Dispense Refill  . aspirin EC 81 MG tablet Take by mouth.    . Blood Glucose Monitoring Suppl (ONE TOUCH ULTRA MINI) w/Device KIT     . fluticasone-salmeterol (ADVAIR HFA) 45-21 MCG/ACT inhaler Inhale into the lungs.    . furosemide (LASIX) 20 MG tablet     . gabapentin (NEURONTIN) 300 MG capsule     . glucose blood test strip     . losartan (COZAAR) 50 MG tablet Take by mouth.    . metFORMIN (GLUCOPHAGE) 1000 MG tablet Take by mouth.    . Multiple Vitamin (MULTI-VITAMINS) TABS Take by mouth.    Marland Kitchen  pravastatin (PRAVACHOL) 40 MG tablet     . traZODone (DESYREL) 100 MG tablet      No current facility-administered medications for this visit.    OBJECTIVE: BP 145/85 mmHg  Pulse 80  Temp(Src) 98 F (36.7 C) (Tympanic)  Wt 194 lb 14.2 oz (88.4 kg)   There is no height on file to calculate BMI.    ECOG FS:0 - Asymptomatic  General: Well-developed, well-nourished, no acute distress. Lungs: Clear to auscultation  bilaterally. Heart: Regular rate and rhythm. No rubs, murmurs, or gallops. Breast: Breast palpated in a circular manner in the sitting and supine positions.  No masses or fullness palpated.  Axilla palpated in both positions with no masses or fullness palpated.  Musculoskeletal: No edema, cyanosis, or clubbing. Neuro: Alert, answering all questions appropriately. Cranial nerves grossly intact. Skin: No rashes or petechiae noted. Psych: Normal affect. Lymphatics: No cervical, clavicular, or axillary LAD.   LAB RESULTS:  Appointment on 02/18/2016  Component Date Value Ref Range Status  . WBC 02/18/2016 4.1  3.6 - 11.0 K/uL Final  . RBC 02/18/2016 4.82  3.80 - 5.20 MIL/uL Final  . Hemoglobin 02/18/2016 14.6  12.0 - 16.0 g/dL Final  . HCT 02/18/2016 42.2  35.0 - 47.0 % Final  . MCV 02/18/2016 87.5  80.0 - 100.0 fL Final  . MCH 02/18/2016 30.2  26.0 - 34.0 pg Final  . MCHC 02/18/2016 34.5  32.0 - 36.0 g/dL Final  . RDW 02/18/2016 14.2  11.5 - 14.5 % Final  . Platelets 02/18/2016 227  150 - 440 K/uL Final  . Neutrophils Relative % 02/18/2016 57   Final  . Neutro Abs 02/18/2016 2.4  1.4 - 6.5 K/uL Final  . Lymphocytes Relative 02/18/2016 28   Final  . Lymphs Abs 02/18/2016 1.1  1.0 - 3.6 K/uL Final  . Monocytes Relative 02/18/2016 9   Final  . Monocytes Absolute 02/18/2016 0.4  0.2 - 0.9 K/uL Final  . Eosinophils Relative 02/18/2016 5   Final  . Eosinophils Absolute 02/18/2016 0.2  0 - 0.7 K/uL Final  . Basophils Relative 02/18/2016 1   Final  . Basophils Absolute 02/18/2016 0.0  0 - 0.1 K/uL Final  . Total Protein 02/18/2016 7.2  6.5 - 8.1 g/dL Final  . Albumin 02/18/2016 4.4  3.5 - 5.0 g/dL Final  . AST 02/18/2016 15  15 - 41 U/L Final  . ALT 02/18/2016 15  14 - 54 U/L Final  . Alkaline Phosphatase 02/18/2016 51  38 - 126 U/L Final  . Total Bilirubin 02/18/2016 0.7  0.3 - 1.2 mg/dL Final  . Bilirubin, Direct 02/18/2016 0.1  0.1 - 0.5 mg/dL Final  . Indirect Bilirubin 02/18/2016 0.6   0.3 - 0.9 mg/dL Final  . Creatinine, Ser 02/18/2016 0.78  0.44 - 1.00 mg/dL Final  . GFR calc non Af Amer 02/18/2016 >60  >60 mL/min Final  . GFR calc Af Amer 02/18/2016 >60  >60 mL/min Final   Comment: (NOTE) The eGFR has been calculated using the CKD EPI equation. This calculation has not been validated in all clinical situations. eGFR's persistently <60 mL/min signify possible Chronic Kidney Disease.     STUDIES: No results found.  ASSESSMENT:  Stage I, T1 B. N0 M0 infiltrating ductal carcinoma of the left breast s/p partial mastectomy.  Size of tumor was 0.8 cm,  ER/PR positive and HER-2/neu negative, positive LVI. One sentinel lymph node was performed and negative. Oncotype DX recurrence score was 7.  PLAN:  1. Breast cancer: Patient did not finish 5 years of hormonal therapy. She quit in February 2016, her 5 years would have been reached in August 2016. Last mammogram 03/2015 reported as Bi-rads 2. Repeat in one year.  Patient requests no follow up. She has mammogram and breast exam with her pcp.    Patient expressed understanding and was in agreement with this plan. She also understands that She can call clinic at any time with any questions, concerns, or complaints.   Dr. Oliva Bustard was available for consultation and review of plan of care for this patient.   Evlyn Kanner, NP   02/18/2016 12:10 PM

## 2016-02-19 LAB — CANCER ANTIGEN 27.29: CA 27.29: 22.8 U/mL (ref 0.0–38.6)

## 2016-08-24 ENCOUNTER — Other Ambulatory Visit: Payer: Self-pay | Admitting: Family Medicine

## 2016-08-24 DIAGNOSIS — Z853 Personal history of malignant neoplasm of breast: Secondary | ICD-10-CM

## 2016-10-23 ENCOUNTER — Ambulatory Visit
Admission: RE | Admit: 2016-10-23 | Discharge: 2016-10-23 | Disposition: A | Discharge status: Home / Self Care | Payer: Medicare HMO | Source: Ambulatory Visit | Attending: Family Medicine | Admitting: Family Medicine

## 2016-10-23 DIAGNOSIS — Z853 Personal history of malignant neoplasm of breast: Secondary | ICD-10-CM

## 2017-02-17 ENCOUNTER — Other Ambulatory Visit: Payer: Medicare HMO

## 2017-02-17 ENCOUNTER — Ambulatory Visit: Payer: Medicare HMO | Admitting: Internal Medicine

## 2017-03-05 ENCOUNTER — Inpatient Hospital Stay: Payer: Medicare HMO | Admitting: Internal Medicine

## 2017-03-05 ENCOUNTER — Inpatient Hospital Stay: Payer: Medicare HMO

## 2017-03-09 ENCOUNTER — Inpatient Hospital Stay (HOSPITAL_BASED_OUTPATIENT_CLINIC_OR_DEPARTMENT_OTHER): Payer: Medicare HMO | Admitting: Oncology

## 2017-03-09 ENCOUNTER — Encounter: Payer: Self-pay | Admitting: Oncology

## 2017-03-09 ENCOUNTER — Inpatient Hospital Stay: Payer: Medicare HMO | Attending: Internal Medicine

## 2017-03-09 VITALS — BP 148/82 | HR 82 | Temp 96.6°F | Resp 16 | Wt 196.1 lb

## 2017-03-09 DIAGNOSIS — I10 Essential (primary) hypertension: Secondary | ICD-10-CM | POA: Insufficient documentation

## 2017-03-09 DIAGNOSIS — Z8673 Personal history of transient ischemic attack (TIA), and cerebral infarction without residual deficits: Secondary | ICD-10-CM | POA: Insufficient documentation

## 2017-03-09 DIAGNOSIS — E785 Hyperlipidemia, unspecified: Secondary | ICD-10-CM | POA: Insufficient documentation

## 2017-03-09 DIAGNOSIS — Z7984 Long term (current) use of oral hypoglycemic drugs: Secondary | ICD-10-CM

## 2017-03-09 DIAGNOSIS — Z923 Personal history of irradiation: Secondary | ICD-10-CM | POA: Diagnosis not present

## 2017-03-09 DIAGNOSIS — Z853 Personal history of malignant neoplasm of breast: Secondary | ICD-10-CM | POA: Insufficient documentation

## 2017-03-09 DIAGNOSIS — Z9011 Acquired absence of right breast and nipple: Secondary | ICD-10-CM | POA: Diagnosis not present

## 2017-03-09 DIAGNOSIS — J449 Chronic obstructive pulmonary disease, unspecified: Secondary | ICD-10-CM | POA: Diagnosis not present

## 2017-03-09 DIAGNOSIS — C50912 Malignant neoplasm of unspecified site of left female breast: Secondary | ICD-10-CM

## 2017-03-09 DIAGNOSIS — Z7982 Long term (current) use of aspirin: Secondary | ICD-10-CM | POA: Diagnosis not present

## 2017-03-09 DIAGNOSIS — E119 Type 2 diabetes mellitus without complications: Secondary | ICD-10-CM | POA: Insufficient documentation

## 2017-03-09 LAB — CBC WITH DIFFERENTIAL/PLATELET
BASOS ABS: 0 10*3/uL (ref 0–0.1)
BASOS PCT: 1 %
EOS ABS: 0.1 10*3/uL (ref 0–0.7)
Eosinophils Relative: 3 %
HEMATOCRIT: 43.3 % (ref 35.0–47.0)
Hemoglobin: 14.8 g/dL (ref 12.0–16.0)
Lymphocytes Relative: 24 %
Lymphs Abs: 1.2 10*3/uL (ref 1.0–3.6)
MCH: 29.4 pg (ref 26.0–34.0)
MCHC: 34.1 g/dL (ref 32.0–36.0)
MCV: 86.1 fL (ref 80.0–100.0)
MONO ABS: 0.5 10*3/uL (ref 0.2–0.9)
Monocytes Relative: 10 %
NEUTROS ABS: 3.1 10*3/uL (ref 1.4–6.5)
Neutrophils Relative %: 64 %
PLATELETS: 271 10*3/uL (ref 150–440)
RBC: 5.03 MIL/uL (ref 3.80–5.20)
RDW: 13.9 % (ref 11.5–14.5)
WBC: 4.9 10*3/uL (ref 3.6–11.0)

## 2017-03-09 LAB — HEPATIC FUNCTION PANEL
ALK PHOS: 52 U/L (ref 38–126)
ALT: 17 U/L (ref 14–54)
AST: 20 U/L (ref 15–41)
Albumin: 4.4 g/dL (ref 3.5–5.0)
BILIRUBIN DIRECT: 0.1 mg/dL (ref 0.1–0.5)
BILIRUBIN INDIRECT: 0.6 mg/dL (ref 0.3–0.9)
Total Bilirubin: 0.7 mg/dL (ref 0.3–1.2)
Total Protein: 7.1 g/dL (ref 6.5–8.1)

## 2017-03-09 LAB — CREATININE, SERUM
CREATININE: 0.72 mg/dL (ref 0.44–1.00)
GFR calc Af Amer: >60 mL/min (ref >60)
GFR calc non Af Amer: >60 mL/min (ref >60)

## 2017-03-09 NOTE — Progress Notes (Signed)
Hematology/Oncology Consult note [facility]  [phone] [phone]  Patient Care Team: Maryland Pink, MD as PCP - General (Family Medicine)   Name of the patient: Jennifer Maddox  [id number]  Jun 09, 1946   Date of visit: 03/09/17  Diagnosis- stage I left breast cancer ER/PR positive HER-2/neu negative  Chief complaint/ Reason for visit- routine follow-up of breast cancer  Heme/Onc history: Patient is a 71 year old female who was diagnosed with stage I breast cancer in 2011. It was a Stage I, T1 B. N0 M0 infiltrating ductal carcinoma of the left breast s/p partial mastectomy.  Size of tumor was 0.8 cm,  ER/PR positive and HER-2/neu negative, positive LVI. One sentinel lymph node was performed and negative. Oncotype DX recurrence score was 7. Patient did not require adjuvant chemotherapy. She did complete adjuvant radiation treatment and went on to take 5 years of hormone therapy. She stopped her hormone therapy in February 2016 just short of a few months of the 5 year mark in August 2016. She has been on yearly surveillance since   Interval history- overall she is doing well and denies any new aches or pains fatigue, unintentional weight loss or loss of appetite.  ECOG PS- 0 Pain scale- 0 Opioid associated constipation- no  Review of systems- Review of Systems  Constitutional: Negative for chills, fever, malaise/fatigue and weight loss.  HENT: Negative for congestion, ear discharge and nosebleeds.   Eyes: Negative for blurred vision.  Respiratory: Negative for cough, hemoptysis, sputum production, shortness of breath and wheezing.   Cardiovascular: Negative for chest pain, palpitations, orthopnea and claudication.  Gastrointestinal: Negative for abdominal pain, blood in stool, constipation, diarrhea, heartburn, melena, nausea and vomiting.  Genitourinary: Negative for dysuria, flank pain, frequency, hematuria and urgency.  Musculoskeletal:  Negative for back pain, joint pain and myalgias.  Skin: Negative for rash.  Neurological: Negative for dizziness, tingling, focal weakness, seizures, weakness and headaches.  Endo/Heme/Allergies: Does not bruise/bleed easily.  Psychiatric/Behavioral: Negative for depression and suicidal ideas. The patient does not have insomnia.      Current treatment- observation  Allergies  Allergen Reactions  . Atorvastatin Other (See Comments)    Cramps  . Naproxen Sodium Other (See Comments)    Head and Neck Puritis     Past Medical History:  Diagnosis Date  . Arthritis   . Breast cancer ([place])   . Breast cancer, left breast ([place]) 02/18/2016  . COPD (chronic obstructive pulmonary disease) ([place])   . Diabetes mellitus without complication ([place])   . Hyperlipidemia   . Hypertension   . TIA (transient ischemic attack)      Past Surgical History:  Procedure Laterality Date  . BREAST EXCISIONAL BIOPSY Left 2011   breast ca with radation    Social History   Social History  . Marital status: Married    Spouse name: N/A  . Number of children: N/A  . Years of education: N/A   Occupational History  . Not on file.   Social History Main Topics  . Smoking status: Never Smoker  . Smokeless tobacco: Not on file  . Alcohol use No  . Drug use: No  . Sexual activity: Not on file   Other Topics Concern  . Not on file   Social History Narrative  . No narrative on file    Family History  Problem Relation Age of Onset  . Lung cancer Mother      Current Outpatient Prescriptions:  .  aspirin EC  81 MG tablet, Take by mouth., Disp: , Rfl:  .  furosemide (LASIX) 20 MG tablet, Take 20 mg by mouth 2 (two) times daily. , Disp: , Rfl:  .  gabapentin (NEURONTIN) 300 MG capsule, Take 300 mg by mouth at bedtime. , Disp: , Rfl:  .  glucose blood test strip, , Disp: , Rfl:  .  Multiple Vitamin (MULTI-VITAMINS) TABS, Take by mouth., Disp: , Rfl:  .  pravastatin (PRAVACHOL) 40 MG tablet, Take 40  mg by mouth daily. , Disp: , Rfl:  .  traZODone (DESYREL) 100 MG tablet, Take 100 mg by mouth at bedtime. , Disp: , Rfl:  .  Blood Glucose Monitoring Suppl (ONE TOUCH ULTRA MINI) w/Device KIT, , Disp: , Rfl:  .  losartan (COZAAR) 50 MG tablet, Take 50 mg by mouth daily. , Disp: , Rfl:  .  metFORMIN (GLUCOPHAGE) 1000 MG tablet, Take 1,000 mg by mouth 2 (two) times daily with a meal. , Disp: , Rfl:   Physical exam:  Vitals:   03/09/17 1058  BP: (!) 148/82  Pulse: 82  Resp: 16  Temp: (!) 96.6 F (35.9 C)  TempSrc: Tympanic  Weight: 196 lb 2 oz (89 kg)   Physical Exam  Constitutional: She is oriented to person, place, and time and well-developed, well-nourished, and in no distress.  HENT:  Head: Normocephalic and atraumatic.  Eyes: EOM are normal. Pupils are equal, round, and reactive to light.  Neck: Normal range of motion.  Cardiovascular: Normal rate, regular rhythm and normal heart sounds.   Pulmonary/Chest: Effort normal and breath sounds normal.  Abdominal: Soft. Bowel sounds are normal.  Neurological: She is alert and oriented to person, place, and time.  Skin: Skin is warm and dry.   Breast exam was performed in seated and lying down position. Patient is status post left lumpectomy with a well-healed surgical scar. No evidence of any palpable masses. No evidence of axillary adenopathy. No evidence of any palpable masses or lumps in the right breast. No evidence of right axillary adenopathy   CMP Latest Ref Rng & Units 03/09/2017  Glucose 65 - 99 mg/dL -  BUN 7 - 18 mg/dL -  Creatinine 0.44 - 1.00 mg/dL 0.72  Sodium 136 - 145 mmol/L -  Potassium 3.5 - 5.1 mmol/L -  Chloride 98 - 107 mmol/L -  CO2 21 - 32 mmol/L -  Calcium 8.5 - 10.1 mg/dL -  Total Protein 6.5 - 8.1 g/dL 7.1  Total Bilirubin 0.3 - 1.2 mg/dL 0.7  Alkaline Phos 38 - 126 U/L 52  AST 15 - 41 U/L 20  ALT 14 - 54 U/L 17   CBC Latest Ref Rng & Units 03/09/2017  WBC 3.6 - 11.0 K/uL 4.9  Hemoglobin 12.0 - 16.0  g/dL 14.8  Hematocrit 35.0 - 47.0 % 43.3  Platelets 150 - 440 K/uL 271    Mammogram from November 2017 did not reveal any evidence of malignancy  Assessment and plan- Patient is a 70 y.o. female with a history of stage I left breast cancer diagnosed in 2011 status post lumpectomy and radiation treatment. She completed 5 years of hormone therapy  Clinically patient is doing well and there is no evidence of recurrence on today's exam. Recent mammogram from November 2017 did not show any evidence of malignancy. At this time patient will continue to follow-up with her primary care doctor Dr. Kary Kos who can schedule subsequent mammograms as well as yearly breast exam. She can be referred  to [place] in the future on an as-needed basis    Visit Diagnosis 1. Malignant neoplasm of left female breast, unspecified estrogen receptor status, unspecified site of breast ([place])      Dr. Randa Evens, MD, MPH [place] at [facility] Pager- [phone] 03/09/2017 11:30 AM

## 2017-03-09 NOTE — Progress Notes (Signed)
Patient here today for follow up, pt states no new concerns today  

## 2017-03-11 LAB — CA 27.29 (SERIAL MONITOR): CA 27.29: 24.3 U/mL (ref 0.0–38.6)

## 2017-09-03 ENCOUNTER — Other Ambulatory Visit: Payer: Self-pay | Admitting: Family Medicine

## 2017-09-03 DIAGNOSIS — Z853 Personal history of malignant neoplasm of breast: Secondary | ICD-10-CM

## 2017-10-19 ENCOUNTER — Other Ambulatory Visit
Admission: RE | Admit: 2017-10-19 | Discharge: 2017-10-19 | Disposition: A | Discharge status: Home / Self Care | Payer: Medicare HMO | Source: Ambulatory Visit | Attending: Nurse Practitioner | Admitting: Nurse Practitioner

## 2017-10-19 DIAGNOSIS — R768 Other specified abnormal immunological findings in serum: Secondary | ICD-10-CM | POA: Insufficient documentation

## 2017-10-21 ENCOUNTER — Other Ambulatory Visit: Payer: Self-pay | Admitting: Nurse Practitioner

## 2017-10-21 DIAGNOSIS — R1904 Left lower quadrant abdominal swelling, mass and lump: Secondary | ICD-10-CM

## 2017-10-22 ENCOUNTER — Ambulatory Visit
Admission: RE | Admit: 2017-10-22 | Discharge: 2017-10-22 | Disposition: A | Discharge status: Home / Self Care | Payer: Medicare HMO | Source: Ambulatory Visit | Attending: Nurse Practitioner | Admitting: Nurse Practitioner

## 2017-10-22 DIAGNOSIS — R911 Solitary pulmonary nodule: Secondary | ICD-10-CM | POA: Diagnosis not present

## 2017-10-22 DIAGNOSIS — K760 Fatty (change of) liver, not elsewhere classified: Secondary | ICD-10-CM | POA: Insufficient documentation

## 2017-10-22 DIAGNOSIS — R1904 Left lower quadrant abdominal swelling, mass and lump: Secondary | ICD-10-CM | POA: Insufficient documentation

## 2017-10-22 LAB — POCT I-STAT CREATININE: Creatinine, Ser: 0.9 mg/dL (ref 0.44–1.00)

## 2017-10-22 MED ORDER — IOPAMIDOL (ISOVUE-300) INJECTION 61%
100.0000 mL | Freq: Once | INTRAVENOUS | Status: AC | PRN
  Administered 2017-10-22: 100 mL via INTRAVENOUS

## 2017-10-26 ENCOUNTER — Encounter: Payer: Self-pay | Admitting: Radiology

## 2017-10-26 ENCOUNTER — Ambulatory Visit
Admission: RE | Admit: 2017-10-26 | Discharge: 2017-10-26 | Disposition: A | Discharge status: Home / Self Care | Payer: Medicare HMO | Source: Ambulatory Visit | Attending: Family Medicine | Admitting: Family Medicine

## 2017-10-26 DIAGNOSIS — Z853 Personal history of malignant neoplasm of breast: Secondary | ICD-10-CM | POA: Insufficient documentation

## 2017-10-26 HISTORY — DX: Personal history of irradiation: Z92.3

## 2017-10-28 ENCOUNTER — Encounter: Payer: Self-pay | Admitting: *Deleted

## 2017-10-29 ENCOUNTER — Ambulatory Visit: Payer: Medicare HMO | Admitting: Anesthesiology

## 2017-10-29 ENCOUNTER — Ambulatory Visit
Admission: RE | Admit: 2017-10-29 | Discharge: 2017-10-29 | Disposition: A | Discharge status: Home / Self Care | Payer: Medicare HMO | Source: Ambulatory Visit | Attending: Unknown Physician Specialty | Admitting: Unknown Physician Specialty

## 2017-10-29 ENCOUNTER — Encounter
Admission: RE | Disposition: A | Discharge status: Home / Self Care | Payer: Self-pay | Source: Ambulatory Visit | Attending: Unknown Physician Specialty

## 2017-10-29 ENCOUNTER — Encounter: Payer: Self-pay | Admitting: Anesthesiology

## 2017-10-29 DIAGNOSIS — Z8673 Personal history of transient ischemic attack (TIA), and cerebral infarction without residual deficits: Secondary | ICD-10-CM | POA: Insufficient documentation

## 2017-10-29 DIAGNOSIS — Z888 Allergy status to other drugs, medicaments and biological substances status: Secondary | ICD-10-CM | POA: Diagnosis not present

## 2017-10-29 DIAGNOSIS — K640 First degree hemorrhoids: Secondary | ICD-10-CM | POA: Insufficient documentation

## 2017-10-29 DIAGNOSIS — Z7982 Long term (current) use of aspirin: Secondary | ICD-10-CM | POA: Diagnosis not present

## 2017-10-29 DIAGNOSIS — Z86010 Personal history of colonic polyps: Secondary | ICD-10-CM | POA: Insufficient documentation

## 2017-10-29 DIAGNOSIS — J449 Chronic obstructive pulmonary disease, unspecified: Secondary | ICD-10-CM | POA: Diagnosis not present

## 2017-10-29 DIAGNOSIS — E119 Type 2 diabetes mellitus without complications: Secondary | ICD-10-CM | POA: Diagnosis not present

## 2017-10-29 DIAGNOSIS — M1990 Unspecified osteoarthritis, unspecified site: Secondary | ICD-10-CM | POA: Insufficient documentation

## 2017-10-29 DIAGNOSIS — Z886 Allergy status to analgesic agent status: Secondary | ICD-10-CM | POA: Diagnosis not present

## 2017-10-29 DIAGNOSIS — Z1211 Encounter for screening for malignant neoplasm of colon: Secondary | ICD-10-CM | POA: Diagnosis not present

## 2017-10-29 DIAGNOSIS — Z79899 Other long term (current) drug therapy: Secondary | ICD-10-CM | POA: Insufficient documentation

## 2017-10-29 DIAGNOSIS — E785 Hyperlipidemia, unspecified: Secondary | ICD-10-CM | POA: Insufficient documentation

## 2017-10-29 DIAGNOSIS — I10 Essential (primary) hypertension: Secondary | ICD-10-CM | POA: Insufficient documentation

## 2017-10-29 DIAGNOSIS — G4730 Sleep apnea, unspecified: Secondary | ICD-10-CM | POA: Diagnosis not present

## 2017-10-29 DIAGNOSIS — K5730 Diverticulosis of large intestine without perforation or abscess without bleeding: Secondary | ICD-10-CM | POA: Diagnosis not present

## 2017-10-29 DIAGNOSIS — Z853 Personal history of malignant neoplasm of breast: Secondary | ICD-10-CM | POA: Insufficient documentation

## 2017-10-29 DIAGNOSIS — Z7984 Long term (current) use of oral hypoglycemic drugs: Secondary | ICD-10-CM | POA: Insufficient documentation

## 2017-10-29 DIAGNOSIS — K210 Gastro-esophageal reflux disease with esophagitis: Secondary | ICD-10-CM | POA: Insufficient documentation

## 2017-10-29 DIAGNOSIS — Z923 Personal history of irradiation: Secondary | ICD-10-CM | POA: Diagnosis not present

## 2017-10-29 HISTORY — DX: Anemia, unspecified: D64.9

## 2017-10-29 HISTORY — PX: ESOPHAGOGASTRODUODENOSCOPY (EGD) WITH PROPOFOL: SHX5813

## 2017-10-29 HISTORY — DX: Sleep apnea, unspecified: G47.30

## 2017-10-29 HISTORY — PX: COLONOSCOPY WITH PROPOFOL: SHX5780

## 2017-10-29 LAB — GLUCOSE, CAPILLARY: Glucose-Capillary: 101 mg/dL — ABNORMAL HIGH (ref 65–99)

## 2017-10-29 SURGERY — COLONOSCOPY WITH PROPOFOL
Anesthesia: General

## 2017-10-29 MED ORDER — LIDOCAINE HCL (PF) 1 % IJ SOLN
INTRAMUSCULAR | Status: AC
  Administered 2017-10-29: 0.3 mL via INTRADERMAL
  Filled 2017-10-29: qty 2

## 2017-10-29 MED ORDER — PROPOFOL 500 MG/50ML IV EMUL
INTRAVENOUS | Status: DC | PRN
  Administered 2017-10-29: 50 ug/kg/min via INTRAVENOUS

## 2017-10-29 MED ORDER — SODIUM CHLORIDE 0.9 % IV SOLN
INTRAVENOUS | Status: DC
  Administered 2017-10-29: 1000 mL via INTRAVENOUS

## 2017-10-29 MED ORDER — PROPOFOL 10 MG/ML IV BOLUS
INTRAVENOUS | Status: DC | PRN
Start: 2017-10-29 — End: 2017-10-29
  Administered 2017-10-29 (×2): 10 mg via INTRAVENOUS
  Administered 2017-10-29: 30 mg via INTRAVENOUS

## 2017-10-29 MED ORDER — GLYCOPYRROLATE 0.2 MG/ML IJ SOLN
INTRAMUSCULAR | Status: DC | PRN
  Administered 2017-10-29: 0.2 mg via INTRAVENOUS

## 2017-10-29 MED ORDER — GLYCOPYRROLATE 0.2 MG/ML IJ SOLN
INTRAMUSCULAR | Status: AC
  Filled 2017-10-29: qty 1

## 2017-10-29 MED ORDER — PROPOFOL 500 MG/50ML IV EMUL
INTRAVENOUS | Status: AC
  Filled 2017-10-29: qty 50

## 2017-10-29 MED ORDER — FENTANYL CITRATE (PF) 100 MCG/2ML IJ SOLN
INTRAMUSCULAR | Status: DC | PRN
  Administered 2017-10-29 (×4): 25 ug via INTRAVENOUS

## 2017-10-29 MED ORDER — LIDOCAINE HCL (PF) 2 % IJ SOLN
INTRAMUSCULAR | Status: DC | PRN
  Administered 2017-10-29: 80 mg

## 2017-10-29 MED ORDER — LIDOCAINE HCL (PF) 1 % IJ SOLN
2.0000 mL | Freq: Once | INTRAMUSCULAR | Status: AC
  Administered 2017-10-29: 0.3 mL via INTRADERMAL

## 2017-10-29 MED ORDER — FENTANYL CITRATE (PF) 100 MCG/2ML IJ SOLN
INTRAMUSCULAR | Status: AC
  Filled 2017-10-29: qty 2

## 2017-10-29 MED ORDER — MIDAZOLAM HCL 2 MG/2ML IJ SOLN
INTRAMUSCULAR | Status: AC
  Filled 2017-10-29: qty 2

## 2017-10-29 MED ORDER — LIDOCAINE HCL (PF) 2 % IJ SOLN
INTRAMUSCULAR | Status: AC
  Filled 2017-10-29: qty 10

## 2017-10-29 MED ORDER — MIDAZOLAM HCL 5 MG/5ML IJ SOLN
INTRAMUSCULAR | Status: DC | PRN
  Administered 2017-10-29: 2 mg via INTRAVENOUS

## 2017-10-29 MED ORDER — SODIUM CHLORIDE 0.9 % IV SOLN: INTRAVENOUS | Status: DC

## 2017-10-29 NOTE — Op Note (Signed)
[facility] Gastroenterology Patient Name: Jennifer Maddox Procedure Date: 10/29/2017 8:17 AM MRN: [id number] Account #: [id number] Date of Birth: 1946-01-23 Admit Type: Outpatient Age: 71 Room: [facility] ENDO ROOM 3 Gender: Female Note Status: Finalized Procedure:            Upper GI endoscopy Indications:          Evaluate for possible celiac disease due to elevated                        DQ2 heterozygus test. Providers:            Manya Silvas, MD Referring MD:         Irven Easterly. Kary Kos, MD (Referring MD) Complications:        No immediate complications. Procedure:            Pre-Anesthesia Assessment:                       - After reviewing the risks and benefits, the patient                        was deemed in satisfactory condition to undergo the                        procedure.                       After obtaining informed consent, the endoscope was                        passed under direct vision. Throughout the procedure,                        the patient's blood pressure, pulse, and oxygen                        saturations were monitored continuously. The Endoscope                        was introduced through the mouth, and advanced to the                        second part of duodenum. The upper GI endoscopy was                        accomplished without difficulty. The patient tolerated                        the procedure well. Findings:      LA Grade A (one or more mucosal breaks less than 5 mm, not extending       between tops of 2 mucosal folds) esophagitis with no bleeding was found       40 cm from the incisors. Biopsies were taken with a cold forceps for       histology.      Patchy mildly erythematous mucosa without bleeding was found in the       cardia, in the gastric body and in the gastric antrum. Biopsies were       taken with a cold forceps for histology. Biopsies were taken with a cold       forceps  for Helicobacter pylori  testing.      The duodenal bulb, first portion of the duodenum and second portion of       the duodenum were normal. Biopsies for histology were taken with a cold       forceps for evaluation of celiac disease. These were done from the       second portion of the duodenum. Impression:           - LA Grade A reflux esophagitis. Biopsied.                       - Erythematous mucosa in the cardia, gastric body and                        antrum. Biopsied.                       - Normal duodenal bulb, first portion of the duodenum                        and second portion of the duodenum. Biopsied. Recommendation:       - Await pathology results. Manya Silvas, MD 10/29/2017 8:41:11 AM This report has been signed electronically. Number of Addenda: 0 Note Initiated On: 10/29/2017 8:17 AM      [facility]

## 2017-10-29 NOTE — Anesthesia Post-op Follow-up Note (Signed)
Anesthesia QCDR form completed.        

## 2017-10-29 NOTE — H&P (Signed)
Primary Care Physician:  Maryland Pink, MD Primary Gastroenterologist:  Dr. Vira Agar  Pre-Procedure History & Physical: HPI:  Jennifer Maddox is a 71 y.o. female is here for an endoscopy and colonoscopy.   Past Medical History:  Diagnosis Date  . Anemia   . Arthritis   . Breast cancer ([place]) 2011   left breast  . Breast cancer, left breast ([place]) 2011  . COPD (chronic obstructive pulmonary disease) ([place])   . Diabetes mellitus without complication ([place])   . Hyperlipidemia   . Hypertension   . Personal history of radiation therapy   . Sleep apnea   . TIA (transient ischemic attack)     Past Surgical History:  Procedure Laterality Date  . BREAST EXCISIONAL BIOPSY Left 2011   breast ca with radation  . COLONOSCOPY W/ POLYPECTOMY      Prior to Admission medications   Medication Sig Start Date End Date Taking? Authorizing Provider  aspirin EC 81 MG tablet Take by mouth.    [provider]  Blood Glucose Monitoring Suppl (ONE TOUCH ULTRA MINI) w/Device KIT  06/13/14   [provider]  furosemide (LASIX) 20 MG tablet Take 20 mg by mouth 2 (two) times daily.  04/22/15   [provider]  gabapentin (NEURONTIN) 300 MG capsule Take 300 mg by mouth at bedtime.  08/07/15   [provider]  glucose blood test strip  06/14/14   [provider]  losartan (COZAAR) 50 MG tablet Take 50 mg by mouth daily.  03/01/15 02/29/16  [provider]  metFORMIN (GLUCOPHAGE) 1000 MG tablet Take 1,000 mg by mouth 2 (two) times daily with a meal.  03/01/15 02/29/16  [provider]  Multiple Vitamin (MULTI-VITAMINS) TABS Take by mouth.    [provider]  pravastatin (PRAVACHOL) 40 MG tablet Take 40 mg by mouth daily.  07/29/15   [provider]  traZODone (DESYREL) 100 MG tablet Take 100 mg by mouth at bedtime.  08/07/15   [provider]    Allergies as of 10/22/2017 - Review Complete 10/22/2017  Allergen Reaction Noted  .  Atorvastatin Other (See Comments) 02/18/2016  . Naproxen sodium Other (See Comments) 02/18/2016    Family History  Problem Relation Age of Onset  . Lung cancer Mother     Social History   Socioeconomic History  . Marital status: Married    Spouse name: Not on file  . Number of children: Not on file  . Years of education: Not on file  . Highest education level: Not on file  Social Needs  . Financial resource strain: Not on file  . Food insecurity - worry: Not on file  . Food insecurity - inability: Not on file  . Transportation needs - medical: Not on file  . Transportation needs - non-medical: Not on file  Occupational History  . Not on file  Tobacco Use  . Smoking status: Never Smoker  . Smokeless tobacco: Never Used  Substance and Sexual Activity  . Alcohol use: No  . Drug use: No  . Sexual activity: Not on file  Other Topics Concern  . Not on file  Social History Narrative  . Not on file    Review of Systems: See HPI, otherwise negative ROS  Physical Exam: BP (!) 163/77   Pulse 63   Temp (!) 97.1 F (36.2 C) (Tympanic)   Resp 18   Ht '5\' 5"'  (1.651 m)   Wt 88.5 kg (195 lb)  SpO2 99%   BMI 32.45 kg/m  General:   Alert,  pleasant and cooperative in NAD Head:  Normocephalic and atraumatic. Neck:  Supple; no masses or thyromegaly. Lungs:  Clear throughout to auscultation.    Heart:  Regular rate and rhythm. Abdomen:  Soft, nontender and nondistended. Normal bowel sounds, without guarding, and without rebound.   Neurologic:  Alert and  oriented x4;  grossly normal neurologically.  Impression/Plan: Jennifer Maddox is here for an endoscopy and colonoscopy to be performed for [facility] colon polyps, biopsy duodenum for possible celiac disease due to elevated markers.  Risks, benefits, limitations, and alternatives regarding  endoscopy and colonoscopy have been reviewed with the patient.  Questions have been answered.  All parties agreeable.   Gaylyn Cheers, MD   10/29/2017, 8:20 AM

## 2017-10-29 NOTE — Op Note (Signed)
[facility] Patient Name: Jennifer Maddox Procedure Date: 10/29/2017 8:16 AM MRN: [id number] Account #: [id number] Date of Birth: Dec 23, 1945 Admit Type: Outpatient Age: 71 Room: [facility] ENDO ROOM 3 Gender: Female Note Status: Finalized Procedure:            Colonoscopy Indications:          High risk colon cancer surveillance: Personal history                        of colonic polyps Providers:            Manya Silvas, MD Referring MD:         Irven Easterly. Kary Kos, MD (Referring MD) Medicines:            Propofol per Anesthesia Complications:        No immediate complications. Procedure:            Pre-Anesthesia Assessment:                       - After reviewing the risks and benefits, the patient                        was deemed in satisfactory condition to undergo the                        procedure.                       After obtaining informed consent, the colonoscope was                        passed under direct vision. Throughout the procedure,                        the patient's blood pressure, pulse, and oxygen                        saturations were monitored continuously. The                        Colonoscope was introduced through the anus and                        advanced to the the cecum, identified by appendiceal                        orifice and ileocecal valve. The colonoscopy was                        performed without difficulty. The patient tolerated the                        procedure well. The quality of the bowel preparation                        was good. Findings:      Multiple small-mouthed diverticula were found in the sigmoid colon,       descending colon and transverse colon.      Internal hemorrhoids were found during endoscopy. The hemorrhoids were       small and Grade I (internal hemorrhoids that do  not prolapse).      The exam was otherwise without abnormality. Impression:           - Diverticulosis in  the sigmoid colon, in the                        descending colon and in the transverse colon.                       - Internal hemorrhoids.                       - The examination was otherwise normal.                       - No specimens collected. Recommendation:       - Repeat colonoscopy in 5 years for surveillance. Manya Silvas, MD 10/29/2017 8:56:26 AM This report has been signed electronically. Number of Addenda: 0 Note Initiated On: 10/29/2017 8:16 AM Scope Withdrawal Time: 0 hours 4 minutes 42 seconds  Total Procedure Duration: 0 hours 11 minutes 16 seconds       [facility]

## 2017-10-29 NOTE — Anesthesia Postprocedure Evaluation (Signed)
Anesthesia Post Note  Patient: [name]  Procedure(s) Performed: COLONOSCOPY WITH PROPOFOL (N/A ) ESOPHAGOGASTRODUODENOSCOPY (EGD) WITH PROPOFOL (N/A )  Patient location during evaluation: Endoscopy Anesthesia Type: General Level of consciousness: awake and alert Pain management: pain level controlled Vital Signs Assessment: post-procedure vital signs reviewed and stable Respiratory status: spontaneous breathing, nonlabored ventilation, respiratory function stable and patient connected to nasal cannula oxygen Cardiovascular status: blood pressure returned to baseline and stable Postop Assessment: no apparent nausea or vomiting Anesthetic complications: no     Last Vitals:  Vitals:   10/29/17 0918 10/29/17 0928  BP: 135/65 140/75  Pulse: 77 80  Resp: 12   Temp:    SpO2: 99%     Last Pain:  Vitals:   10/29/17 0858  TempSrc: Tympanic                 Precious Haws Selma Rodelo

## 2017-10-29 NOTE — Transfer of Care (Signed)
Immediate Anesthesia Transfer of Care Note  Patient: Jennifer Maddox  Procedure(s) Performed: COLONOSCOPY WITH PROPOFOL (N/A ) ESOPHAGOGASTRODUODENOSCOPY (EGD) WITH PROPOFOL (N/A )  Patient Location: PACU  Anesthesia Type:General  Level of Consciousness: sedated  Airway & Oxygen Therapy: Patient Spontanous Breathing and Patient connected to nasal cannula oxygen  Post-op Assessment: Report given to RN and Post -op Vital signs reviewed and stable  Post vital signs: Reviewed  Last Vitals:  Vitals:   10/29/17 0738  BP: (!) 163/77  Pulse: 63  Resp: 18  Temp: (!) 36.2 C  SpO2: 99%    Last Pain:  Vitals:   10/29/17 0738  TempSrc: Tympanic         Complications: No apparent anesthesia complications

## 2017-10-29 NOTE — Anesthesia Preprocedure Evaluation (Addendum)
Anesthesia Evaluation  Patient identified by MRN, date of birth, ID band Patient awake    Reviewed: Allergy & Precautions, H&P , NPO status , Patient's Chart, lab work & pertinent test results  History of Anesthesia Complications (+) DIFFICULT IV STICK / SPECIAL LINE and history of anesthetic complications  Airway Mallampati: III  TM Distance: <3 FB Neck ROM: limited    Dental  (+) Poor Dentition, Missing, Edentulous Upper, Edentulous Lower, Upper Dentures, Lower Dentures   Pulmonary neg shortness of breath, sleep apnea , COPD,           Cardiovascular Exercise Tolerance: Good hypertension, (-) angina(-) Past MI and (-) DOE      Neuro/Psych TIAnegative psych ROS   GI/Hepatic negative GI ROS, Neg liver ROS, neg GERD  ,  Endo/Other  diabetes, Type 2, Oral Hypoglycemic Agents  Renal/GU negative Renal ROS  negative genitourinary   Musculoskeletal   Abdominal   Peds  Hematology negative hematology ROS (+)   Anesthesia Other Findings Past Medical History: No date: Anemia No date: Arthritis 2011: Breast cancer ([place])     Comment:  left breast 2011: Breast cancer, left breast ([place]) No date: COPD (chronic obstructive pulmonary disease) ([facility]) No date: Diabetes mellitus without complication ([facility]) No date: Hyperlipidemia No date: Hypertension No date: Personal history of radiation therapy No date: Sleep apnea No date: TIA (transient ischemic attack)  Past Surgical History: 2011: BREAST EXCISIONAL BIOPSY; Left     Comment:  breast ca with radation No date: COLONOSCOPY W/ POLYPECTOMY  BMI    Body Mass Index:  32.45 kg/m      Reproductive/Obstetrics negative OB ROS                            Anesthesia Physical Anesthesia Plan  ASA: III  Anesthesia Plan: General   Post-op Pain Management:    Induction: Intravenous  PONV Risk Score and Plan: Propofol infusion  Airway Management  Planned: Natural Airway and Nasal Cannula  Additional Equipment:   Intra-op Plan:   Post-operative Plan:   Informed Consent: I have reviewed the patients History and Physical, chart, labs and discussed the procedure including the risks, benefits and alternatives for the proposed anesthesia with the patient or authorized representative who has indicated his/her understanding and acceptance.   Dental Advisory Given  Plan Discussed with: Anesthesiologist, CRNA and Surgeon  Anesthesia Plan Comments: (Patient reports left breast surgery with no lymph node removal.  No reported history of lymphedema in that arm. She is difficult IV stick and we have failed to place IV in other extremities.  Per [employer] recommendations plan to place IV in left arm.  This plan was discussed with patient who voiced understanding.  Patient consented for risks of anesthesia including but not limited to:  - adverse reactions to medications - risk of intubation if required - damage to teeth, lips or other oral mucosa - sore throat or hoarseness - Damage to heart, brain, lungs or loss of life  Patient voiced understanding.)       Anesthesia Quick Evaluation

## 2017-11-01 ENCOUNTER — Encounter: Payer: Self-pay | Admitting: Unknown Physician Specialty

## 2017-11-01 LAB — SURGICAL PATHOLOGY

## 2017-11-09 ENCOUNTER — Inpatient Hospital Stay: Payer: Medicare HMO

## 2017-11-09 ENCOUNTER — Encounter: Payer: Self-pay | Admitting: Oncology

## 2017-11-09 ENCOUNTER — Other Ambulatory Visit: Payer: Self-pay

## 2017-11-09 ENCOUNTER — Inpatient Hospital Stay: Payer: Medicare HMO | Attending: Oncology | Admitting: Oncology

## 2017-11-09 VITALS — BP 124/75 | HR 64 | Temp 97.7°F | Resp 16 | Wt 194.0 lb

## 2017-11-09 DIAGNOSIS — Z923 Personal history of irradiation: Secondary | ICD-10-CM | POA: Diagnosis not present

## 2017-11-09 DIAGNOSIS — D649 Anemia, unspecified: Secondary | ICD-10-CM | POA: Insufficient documentation

## 2017-11-09 DIAGNOSIS — Z9012 Acquired absence of left breast and nipple: Secondary | ICD-10-CM | POA: Diagnosis not present

## 2017-11-09 DIAGNOSIS — D801 Nonfamilial hypogammaglobulinemia: Secondary | ICD-10-CM

## 2017-11-09 DIAGNOSIS — R1904 Left lower quadrant abdominal swelling, mass and lump: Secondary | ICD-10-CM | POA: Diagnosis not present

## 2017-11-09 DIAGNOSIS — K589 Irritable bowel syndrome without diarrhea: Secondary | ICD-10-CM | POA: Diagnosis not present

## 2017-11-09 DIAGNOSIS — K429 Umbilical hernia without obstruction or gangrene: Secondary | ICD-10-CM | POA: Insufficient documentation

## 2017-11-09 DIAGNOSIS — Z7982 Long term (current) use of aspirin: Secondary | ICD-10-CM | POA: Insufficient documentation

## 2017-11-09 DIAGNOSIS — M5136 Other intervertebral disc degeneration, lumbar region: Secondary | ICD-10-CM | POA: Insufficient documentation

## 2017-11-09 DIAGNOSIS — K760 Fatty (change of) liver, not elsewhere classified: Secondary | ICD-10-CM | POA: Diagnosis not present

## 2017-11-09 DIAGNOSIS — M129 Arthropathy, unspecified: Secondary | ICD-10-CM | POA: Diagnosis not present

## 2017-11-09 DIAGNOSIS — E785 Hyperlipidemia, unspecified: Secondary | ICD-10-CM | POA: Diagnosis not present

## 2017-11-09 DIAGNOSIS — Z7984 Long term (current) use of oral hypoglycemic drugs: Secondary | ICD-10-CM | POA: Diagnosis not present

## 2017-11-09 DIAGNOSIS — R911 Solitary pulmonary nodule: Secondary | ICD-10-CM | POA: Insufficient documentation

## 2017-11-09 DIAGNOSIS — Z853 Personal history of malignant neoplasm of breast: Secondary | ICD-10-CM | POA: Diagnosis not present

## 2017-11-09 DIAGNOSIS — K5730 Diverticulosis of large intestine without perforation or abscess without bleeding: Secondary | ICD-10-CM

## 2017-11-09 DIAGNOSIS — I10 Essential (primary) hypertension: Secondary | ICD-10-CM | POA: Insufficient documentation

## 2017-11-09 DIAGNOSIS — J449 Chronic obstructive pulmonary disease, unspecified: Secondary | ICD-10-CM | POA: Diagnosis not present

## 2017-11-09 DIAGNOSIS — Z8673 Personal history of transient ischemic attack (TIA), and cerebral infarction without residual deficits: Secondary | ICD-10-CM | POA: Insufficient documentation

## 2017-11-09 DIAGNOSIS — G4730 Sleep apnea, unspecified: Secondary | ICD-10-CM | POA: Diagnosis not present

## 2017-11-09 DIAGNOSIS — E119 Type 2 diabetes mellitus without complications: Secondary | ICD-10-CM | POA: Diagnosis not present

## 2017-11-09 DIAGNOSIS — Z79899 Other long term (current) drug therapy: Secondary | ICD-10-CM | POA: Diagnosis not present

## 2017-11-09 LAB — COMPREHENSIVE METABOLIC PANEL
ALK PHOS: 48 U/L (ref 38–126)
ALT: 14 U/L (ref 14–54)
ANION GAP: 10 (ref 5–15)
AST: 17 U/L (ref 15–41)
Albumin: 4.5 g/dL (ref 3.5–5.0)
BUN: 11 mg/dL (ref 6–20)
CALCIUM: 9.5 mg/dL (ref 8.9–10.3)
CO2: 31 mmol/L (ref 22–32)
Chloride: 98 mmol/L — ABNORMAL LOW (ref 101–111)
Creatinine, Ser: 0.84 mg/dL (ref 0.44–1.00)
GFR calc non Af Amer: >60 mL/min (ref >60)
Glucose, Bld: 105 mg/dL — ABNORMAL HIGH (ref 65–99)
POTASSIUM: 3.5 mmol/L (ref 3.5–5.1)
SODIUM: 139 mmol/L (ref 135–145)
TOTAL PROTEIN: 7 g/dL (ref 6.5–8.1)
Total Bilirubin: 0.7 mg/dL (ref 0.3–1.2)

## 2017-11-09 LAB — CBC WITH DIFFERENTIAL/PLATELET
Basophils Absolute: 0 10*3/uL (ref 0–0.1)
Basophils Relative: 1 %
Eosinophils Absolute: 0.1 10*3/uL (ref 0–0.7)
Eosinophils Relative: 2 %
HEMATOCRIT: 43.1 % (ref 35.0–47.0)
HEMOGLOBIN: 14.5 g/dL (ref 12.0–16.0)
Lymphocytes Relative: 22 %
Lymphs Abs: 1 10*3/uL (ref 1.0–3.6)
MCH: 29.3 pg (ref 26.0–34.0)
MCHC: 33.7 g/dL (ref 32.0–36.0)
MCV: 86.8 fL (ref 80.0–100.0)
MONOS PCT: 9 %
Monocytes Absolute: 0.4 10*3/uL (ref 0.2–0.9)
NEUTROS ABS: 2.9 10*3/uL (ref 1.4–6.5)
NEUTROS PCT: 66 %
Platelets: 254 10*3/uL (ref 150–440)
RBC: 4.97 MIL/uL (ref 3.80–5.20)
RDW: 14.2 % (ref 11.5–14.5)
WBC: 4.4 10*3/uL (ref 3.6–11.0)

## 2017-11-09 LAB — PROTEIN, URINE, RANDOM: Total Protein, Urine: <6 mg/dL

## 2017-11-09 NOTE — Progress Notes (Signed)
Old patient with history of breast cancer here for new finding of low IGA in her blood work. She went to see GI for diarrhea, and had a colonoscopy that was normal. Diarrhea has improved but has not completely gone. Patient also has a history of irritable bowel syndrome. Patient states that she is feeling well today and denies having any pain. She states that she is not really sure why she is here today.

## 2017-11-09 NOTE — Progress Notes (Signed)
Hematology/Oncology Consult note [facility]  [phone] [phone]  Patient Care Team: [place] Pink, MD as PCP - General (Family Medicine)   Name of the patient: Jennifer Maddox  [id number]  1946/09/19   Date of visit: 11/09/17  Diagnosis- 1. New referral for low IgA  2. stage I left breast cancer ER/PR positive HER-2/neu negative  Chief complaint/ Reason for visit- routine follow-up of breast cancer  Heme/Onc history: Patient is a 71 year old female who was diagnosed with stage I breast cancer in 2011. It was a Stage I, T1 B. N0 M0 infiltrating ductal carcinoma of the left breast s/p partial mastectomy. Size of tumor was 0.8 cm, ER/PR positive and HER-2/neu negative, positive LVI. One sentinel lymph node was performed and negative. Oncotype DX recurrence score was 7. Patient did not require adjuvant chemotherapy. She did complete adjuvant radiation treatment and went on to take 5 years of hormone therapy. She stopped her hormone therapy in February 2016 just short of a few months of the 5 year mark in August 2016. She has been on yearly surveillance since. Last seen in march and plan was to f/u with pcp for her breast cancer   Interval history- She has now been referred to [place] for low IgA.  She follows up with GI for irritable bowel syndrome and recently underwent blood work including quantitative immunoglobulins which showed a low IgA of 58, low IgG of 520 and low IgM of 12  Overall she states she is doing well.  She denies any unintentional weight loss or aches or pains anywhere.  She has intermittent symptoms of diarrhea as well as abdominal bloating for which she sees gastroenterology.   ECOG PS- 1 Pain scale- 0 Opioid associated constipation- no  Review of systems- Review of Systems  Constitutional: Positive for malaise/fatigue. Negative for chills, fever and weight loss.  HENT: Negative for congestion, ear discharge and  nosebleeds.   Eyes: Negative for blurred vision.  Respiratory: Negative for cough, hemoptysis, sputum production, shortness of breath and wheezing.   Cardiovascular: Negative for chest pain, palpitations, orthopnea and claudication.  Gastrointestinal: Negative for abdominal pain, blood in stool, constipation, diarrhea, heartburn, melena, nausea and vomiting.  Genitourinary: Negative for dysuria, flank pain, frequency, hematuria and urgency.  Musculoskeletal: Negative for back pain, joint pain and myalgias.  Skin: Negative for rash.  Neurological: Negative for dizziness, tingling, focal weakness, seizures, weakness and headaches.  Endo/Heme/Allergies: Does not bruise/bleed easily.  Psychiatric/Behavioral: Negative for depression and suicidal ideas. The patient does not have insomnia.        Allergies  Allergen Reactions  . Atorvastatin Other (See Comments)    Cramps  . Naproxen Sodium Other (See Comments)    Head and Neck Puritis     Past Medical History:  Diagnosis Date  . Anemia   . Arthritis   . Breast cancer ([place]) 2011   left breast  . Breast cancer, left breast ([place]) 2011  . COPD (chronic obstructive pulmonary disease) ([place])   . Diabetes mellitus without complication ([place])   . Hyperlipidemia   . Hypertension   . Personal history of radiation therapy   . Sleep apnea   . TIA (transient ischemic attack)      Past Surgical History:  Procedure Laterality Date  . BREAST EXCISIONAL BIOPSY Left 2011   breast ca with radation  . COLONOSCOPY W/ POLYPECTOMY    . COLONOSCOPY WITH PROPOFOL N/A 10/29/2017   Procedure: COLONOSCOPY WITH PROPOFOL;  Surgeon: Vira Agar,  Gavin Pound, MD;  Location: [facility] ENDOSCOPY;  Service: Endoscopy;  Laterality: N/A;  . ESOPHAGOGASTRODUODENOSCOPY (EGD) WITH PROPOFOL N/A 10/29/2017   Procedure: ESOPHAGOGASTRODUODENOSCOPY (EGD) WITH PROPOFOL;  Surgeon: Manya Silvas, MD;  Location: [facility] ENDOSCOPY;  Service: Endoscopy;  Laterality: N/A;    Social  History   Socioeconomic History  . Marital status: Married    Spouse name: Not on file  . Number of children: Not on file  . Years of education: Not on file  . Highest education level: Not on file  Social Needs  . Financial resource strain: Not on file  . Food insecurity - worry: Not on file  . Food insecurity - inability: Not on file  . Transportation needs - medical: Not on file  . Transportation needs - non-medical: Not on file  Occupational History  . Not on file  Tobacco Use  . Smoking status: Never Smoker  . Smokeless tobacco: Never Used  Substance and Sexual Activity  . Alcohol use: No  . Drug use: No  . Sexual activity: Not on file  Other Topics Concern  . Not on file  Social History Narrative  . Not on file    Family History  Problem Relation Age of Onset  . Lung cancer Mother      Current Outpatient Medications:  .  aspirin EC 81 MG tablet, Take by mouth., Disp: , Rfl:  .  Blood Glucose Monitoring Suppl (ONE TOUCH ULTRA MINI) w/Device KIT, , Disp: , Rfl:  .  furosemide (LASIX) 20 MG tablet, Take 20 mg by mouth 2 (two) times daily. , Disp: , Rfl:  .  gabapentin (NEURONTIN) 300 MG capsule, Take 300 mg by mouth at bedtime. , Disp: , Rfl:  .  glucose blood test strip, , Disp: , Rfl:  .  losartan (COZAAR) 50 MG tablet, Take 50 mg by mouth daily. , Disp: , Rfl:  .  metFORMIN (GLUCOPHAGE) 1000 MG tablet, Take 1,000 mg by mouth 2 (two) times daily with a meal. , Disp: , Rfl:  .  Multiple Vitamin (MULTI-VITAMINS) TABS, Take by mouth., Disp: , Rfl:  .  pravastatin (PRAVACHOL) 40 MG tablet, Take 40 mg by mouth daily. , Disp: , Rfl:  .  traZODone (DESYREL) 100 MG tablet, Take 100 mg by mouth at bedtime. , Disp: , Rfl:   Physical exam:  Vitals:   11/09/17 1019 11/09/17 1021  BP:  124/75  Pulse:  64  Resp:  16  Temp:  97.7 F (36.5 C)  TempSrc:  Tympanic  Weight: 194 lb (88 kg)    Physical Exam  Constitutional: She is oriented to person, place, and time and  well-developed, well-nourished, and in no distress.  HENT:  Head: Normocephalic and atraumatic.  Eyes: EOM are normal. Pupils are equal, round, and reactive to light.  Neck: Normal range of motion.  Cardiovascular: Normal rate, regular rhythm and normal heart sounds.  Pulmonary/Chest: Effort normal and breath sounds normal.  Abdominal: Soft. Bowel sounds are normal.  No palpable splenomegaly  Lymphadenopathy:  No palpable supraclavicular, cervical, axillary or inguinal adenopathy  Neurological: She is alert and oriented to person, place, and time.  Skin: Skin is warm and dry.     CMP Latest Ref Rng & Units 10/22/2017  Glucose 65 - 99 mg/dL -  BUN 7 - 18 mg/dL -  Creatinine 0.44 - 1.00 mg/dL 0.90  Sodium 136 - 145 mmol/L -  Potassium 3.5 - 5.1 mmol/L -  Chloride 98 - 107 mmol/L -  CO2 21 - 32 mmol/L -  Calcium 8.5 - 10.1 mg/dL -  Total Protein 6.5 - 8.1 g/dL -  Total Bilirubin 0.3 - 1.2 mg/dL -  Alkaline Phos 38 - 126 U/L -  AST 15 - 41 U/L -  ALT 14 - 54 U/L -   CBC Latest Ref Rng & Units 03/09/2017  WBC 3.6 - 11.0 K/uL 4.9  Hemoglobin 12.0 - 16.0 g/dL 14.8  Hematocrit 35.0 - 47.0 % 43.3  Platelets 150 - 440 K/uL 271    No images are attached to the encounter.  Ct Abdomen Pelvis W Contrast  Result Date: 10/22/2017 CLINICAL DATA:  Left lower quadrant abdominal mass. Diarrhea for 6 years. History of left breast cancer. EXAM: CT ABDOMEN AND PELVIS WITH CONTRAST TECHNIQUE: Multidetector CT imaging of the abdomen and pelvis was performed using the standard protocol following bolus administration of intravenous contrast. CONTRAST:  163m ISOVUE-300 IOPAMIDOL (ISOVUE-300) INJECTION 61% COMPARISON:  Limited comparison images on the lumbar MRI from 05/01/2014 FINDINGS: Lower chest: 2 by 3 mm left lower lobe pulmonary nodule on image 1/8. Calcifications posteriorly along the mitral valve. Hepatobiliary: Cholecystectomy. Diffuse hepatic steatosis. 10 mm focus of accentuated density in the  lateral segment left hepatic lobe on image 12/3 is very indistinct and most likely a benign lesion such as a small hemangioma, transient hepatic attenuation difference, or focus of fatty sparing. Pancreas: Unremarkable Spleen: Unremarkable Adrenals/Urinary Tract: 1.4 by 1.7 cm nodule of the lateral limb left adrenal gland has a portal venous phase density of 60 and a delayed phase density of 37 Hounsfield units, yielding a relative washout of 38% which is indeterminate. Right adrenal gland normal. No urinary tract calculi are identified. Stomach/Bowel: Sigmoid diverticulosis with scattered diverticula in the descending colon, but no compelling findings of active diverticulitis. There is a collection of loops of small bowel which extend lateral to the descending colon. No findings of small bowel dilatation or other abnormality. Vascular/Lymphatic: Aortoiliac atherosclerotic vascular disease. No pathologic adenopathy. Reproductive: Hysterectomy. Adnexa unremarkable aside from a trace amount of free pelvic fluid adjacent to the right ovary. Other: No supplemental non-categorized findings. Musculoskeletal: Grade 1 degenerative anterolisthesis at L4-5 with degenerative endplate sclerosis and loss of disc height at the L3-4 level. Mild dextroconvex lumbar scoliosis. Borderline left foraminal stenosis at L3-4. Small umbilical hernia contains adipose tissue. IMPRESSION: 1. No left lower quadrant mass is identified. However, there is a collection of several proximal small bowel loops which extend lateral to the descending colon which could produce a palpable finding. This may potentially represent a lax transmesenteric or paraduodenal hernia but is not resulting an obstruction or complicating features. 2. The diffuse hepatic steatosis. A 10 mm focus of slightly accentuated density in segment 2 of the liver is most likely to be a small hemangioma, transient hepatic attenuation difference focus, or a small area of fatty  sparing but is technically nonspecific. 3. 2 by 3 mm left lower lobe pulmonary nodule. No follow-up needed if patient is low-risk. Non-contrast chest CT can be considered in 12 months if patient is high-risk. This recommendation follows the consensus statement: Guidelines for Management of Incidental Pulmonary Nodules Detected on CT Images: From the Fleischner Society 2017; Radiology 2017; 284:228-243. 4. 1.4 by 1.7 cm nonspecific small mass of the lateral limb left adrenal gland. Given that this lesion is not changed in size compared to the therapy planning CT dated 05/29/2010, and is highly likely to be benign. Electronically Signed   By: WCindra EvesD.  On: 10/22/2017 14:24   Mm Diag Breast Tomo Bilateral  Result Date: 10/26/2017 CLINICAL DATA:  Left lumpectomy.  Annual mammography. EXAM: 2D DIGITAL DIAGNOSTIC BILATERAL MAMMOGRAM WITH CAD AND ADJUNCT TOMO COMPARISON:  Previous exam(s). ACR Breast Density Category b: There are scattered areas of fibroglandular density. FINDINGS: The left lumpectomy site is stable. No suspicious masses, calcifications, or distortion in either breast. Mammographic images were processed with CAD. IMPRESSION: No mammographic evidence of malignancy. RECOMMENDATION: Annual screening mammography. I have discussed the findings and recommendations with the patient. Results were also provided in writing at the conclusion of the visit. If applicable, a reminder letter will be sent to the patient regarding the next appointment. BI-RADS CATEGORY  2: Benign. Electronically Signed   By: Dorise Bullion III M.D   On: 10/26/2017 11:32     Assessment and plan- Patient is a 71 y.o. female with a history of breast cancer referred for hypogammaglobulinemia  Hypogammaglobulinemia is often nonspecific and at her age I do not suspect chronic variable immunodeficiency.  Patient denies any symptoms of recurrent infections hospitalizations or unintentional weight loss.  Today I will  check CBC, CMP, myeloma panel, ANA as well as urine protein and urine protein electrophoresis.  I will give her a call with the results of her blood work.  She does not need any follow-up for hypogammaglobulinemia with hematology.  If she were to have any symptoms of recurrent infections we could consider doing IVIG prophylactically at that time but she is not currently a candidate for the same  With regards to her breast cancer she can continue to follow-up with her primary care doctor and recent mammogram from 10/26/2017 revealed no evidence of malignancy   Visit Diagnosis 1. Hypogammaglobulinemia ([place])      Dr. Randa Evens, MD, MPH [facility] Pager- [phone] 11/09/2017 10:38 AM

## 2017-11-10 LAB — ANA COMPREHENSIVE PANEL
Anti JO-1: <0.2 AI (ref 0.0–0.9)
Centromere Ab Screen: <0.2 AI (ref 0.0–0.9)
Chromatin Ab SerPl-aCnc: <0.2 AI (ref 0.0–0.9)
Ribonucleic Protein: <0.2 AI (ref 0.0–0.9)
SSA (Ro) (ENA) Antibody, IgG: <0.2 AI (ref 0.0–0.9)
SSB (La) (ENA) Antibody, IgG: <0.2 AI (ref 0.0–0.9)

## 2017-11-10 LAB — PROTEIN ELECTRO, RANDOM URINE
ALBUMIN ELP UR: 100 %
ALPHA-1-GLOBULIN, U: 0 %
Alpha-2-Globulin, U: 0 %
Beta Globulin, U: 0 %
Gamma Globulin, U: 0 %

## 2017-11-10 LAB — MULTIPLE MYELOMA PANEL, SERUM
ALBUMIN/GLOB SERPL: 1.7 (ref 0.7–1.7)
Albumin SerPl Elph-Mcnc: 4 g/dL (ref 2.9–4.4)
Alpha 1: 0.2 g/dL (ref 0.0–0.4)
Alpha2 Glob SerPl Elph-Mcnc: 0.8 g/dL (ref 0.4–1.0)
B-GLOBULIN SERPL ELPH-MCNC: 1 g/dL (ref 0.7–1.3)
GAMMA GLOB SERPL ELPH-MCNC: 0.5 g/dL (ref 0.4–1.8)
GLOBULIN, TOTAL: 2.5 g/dL (ref 2.2–3.9)
IGA: 46 mg/dL — AB (ref 64–422)
IgG (Immunoglobin G), Serum: 485 mg/dL — ABNORMAL LOW (ref 700–1600)
IgM (Immunoglobulin M), Srm: 14 mg/dL — ABNORMAL LOW (ref 26–217)
Total Protein ELP: 6.5 g/dL (ref 6.0–8.5)

## 2017-11-17 ENCOUNTER — Telehealth: Payer: Self-pay | Admitting: *Deleted

## 2017-11-17 NOTE — Telephone Encounter (Signed)
-----  Message from Sindy Guadeloupe, MD sent at 11/11/2017  8:08 AM EST ----- Please see my recent note. No evidence of multiple myeloma, proteinuria. Vasculitis work up negative. She does have hypogammaglobulinemia of uncertain etiology. No further work up at this time. If she has unintentional weight loss, recurrent infections- she needs to call us. No indication for IVIG in the absence of infections. Please let her know. Thanks, Astrid Divine

## 2017-11-17 NOTE — Telephone Encounter (Signed)
Called patient to let her know that all of her lab values were normal except for the IgG level. At this time Dr. Janese Banks feels as if she has any recurrent infections then she would be a candidate to get IVIG to raise level up.  Patient states that she has not had infections in a long time.  She used to get sinus infections quite frequently but has not had one in a couple years.  Patient will call back if she starts having frequent infections.

## 2017-12-14 HISTORY — PX: MASTECTOMY: SHX3

## 2017-12-15 DIAGNOSIS — I358 Other nonrheumatic aortic valve disorders: Secondary | ICD-10-CM | POA: Insufficient documentation

## 2017-12-15 DIAGNOSIS — R011 Cardiac murmur, unspecified: Secondary | ICD-10-CM | POA: Insufficient documentation

## 2017-12-16 ENCOUNTER — Encounter: Payer: Medicare HMO | Attending: *Deleted | Admitting: Dietician

## 2017-12-16 ENCOUNTER — Encounter: Payer: Self-pay | Admitting: Dietician

## 2017-12-16 VITALS — Ht 65.0 in | Wt 195.1 lb

## 2017-12-16 DIAGNOSIS — Z713 Dietary counseling and surveillance: Secondary | ICD-10-CM | POA: Insufficient documentation

## 2017-12-16 DIAGNOSIS — E6609 Other obesity due to excess calories: Secondary | ICD-10-CM

## 2017-12-16 DIAGNOSIS — Z6832 Body mass index (BMI) 32.0-32.9, adult: Secondary | ICD-10-CM | POA: Diagnosis not present

## 2017-12-16 DIAGNOSIS — K760 Fatty (change of) liver, not elsewhere classified: Secondary | ICD-10-CM | POA: Diagnosis not present

## 2017-12-16 DIAGNOSIS — E669 Obesity, unspecified: Secondary | ICD-10-CM | POA: Insufficient documentation

## 2017-12-16 DIAGNOSIS — E119 Type 2 diabetes mellitus without complications: Secondary | ICD-10-CM | POA: Diagnosis not present

## 2017-12-16 NOTE — Patient Instructions (Addendum)
   Increase vegetable intake -- plan to include at least 2-3 servings every day with meals or snacks. Also include fruit at least 2 times a day.   Eat something every 3-5 hours during the day, at least 2 hours between meals and snacks.   Work on smaller snacks in the evenings, stop eating at least 2 hours before going to sleep.   Keep up the daily walking, add extra steps, and/or try exercises in booklet

## 2017-12-16 NOTE — Progress Notes (Signed)
Medical Nutrition Therapy: Visit start time: 0900  end time: 1020  Assessment:  Diagnosis: fatty liver Past medical history: diabetes, obesity, IBS, HLD, HTN Psychosocial issues/ stress concerns: increased personal stress in past week Preferred learning method:  . Hands-on   Current weight: 195.1lbs  Height: 5'5" Medications, supplements: reconciled list in medical record  Progress and evaluation: Patient reports weight typically fluctuates within 5lbs, unable to lose much below 190 before gaining weight back. She struggles with IBS, mostly with diarrhea, but occasional constipation. Has recently been diagnosed with fatty liver. Has tried multiple diets but only able to follow for short term. Craves sweets, espeically at night. Patient reports some lack of motivation in working on exercise in particular. She does attend TOPS support group meetings weekly; goes out for dinner with the group after the meetings.    Physical activity: no structured exercise; picks up grandson from school by walking 10 minutes round trip, 5x a week.   Dietary Intake:  Usual eating pattern includes 2 meals and several snacks per day. Dining out frequency: 1 meals per week.  Breakfast: usually oatmeal, inst with raisins dates walnuts 2% lactose free milk Snack: none Lunch: 1-2pm sometimes cooked meal neat, veg, bread or cornbread Snack: candy, nuts, cookies Supper: oatmeal, sandwich, sometimes just cookies or nuts. Snack: same as pm, sometimes small spoon peanut butter at bedtime Beverages: water, sugar free drinks, up to 1/2 pot of black  coffee in am  Nutrition Care Education: Topics covered: weight control for fatty liver, diabetes Basic nutrition: basic food groups, appropriate nutrient balance, appropriate meal and snack schedule, general nutrition guidelines    Weight control: benefits of weight control, behavioral changes for weight loss-- portion control for snacks, healthy snack options, timing of  meals and snacks; basic meal planning for about 1300kcal daily intake (5-6oz protein foods, 9-10 servings of carbs daily); role of exercise in weight control and options for increasing daily activity-- encouraged self-reward system and/or setting personal achievement goal; encouraged tracking food intake and activity. Diabetes: appropriate meal and snack schedule, appropriate carb intake and balance Hypertension: identifying food sources of potassium, magnesium Hyperlipidemia: healthy and unhealthy fats, role of fiber   Nutritional Diagnosis:  [place]-1.4 Altered GI function As related to nonalcoholic fatty liver disease.  As evidenced by MD report, weight gain, patient report. [place]-3.3 Overweight/obesity As related to history of excess calories and limited physical activity.  As evidenced by BMI 33, patient report.  Intervention: Instruction as noted above.   Set goals with input from patient.   Encouraged her to track food intake as well as physical activity, she will use TOPS forms she has at home.   Encouraged gradual and manageable changes to help with motivation.  Education Materials given:  . Plate Planner with food lists . Snacking handout  Exercise booklet . Goals/ instructions  Learner/ who was taught:  . Patient   Level of understanding: Marland Kitchen Verbalizes/ demonstrates competency  Demonstrated degree of understanding via:   Teach back Learning barriers: . None  Willingness to learn/ readiness for change: . Acceptance, ready for change  Monitoring and Evaluation:  Dietary intake, exercise, liver health, and body weight      follow up: 01/21/18

## 2017-12-22 ENCOUNTER — Other Ambulatory Visit: Payer: Self-pay | Admitting: Nurse Practitioner

## 2017-12-22 DIAGNOSIS — R935 Abnormal findings on diagnostic imaging of other abdominal regions, including retroperitoneum: Secondary | ICD-10-CM

## 2017-12-22 DIAGNOSIS — R932 Abnormal findings on diagnostic imaging of liver and biliary tract: Secondary | ICD-10-CM

## 2018-01-04 ENCOUNTER — Ambulatory Visit
Admission: RE | Admit: 2018-01-04 | Discharge: 2018-01-04 | Disposition: A | Discharge status: Home / Self Care | Payer: Medicare HMO | Source: Ambulatory Visit | Attending: Nurse Practitioner | Admitting: Nurse Practitioner

## 2018-01-04 DIAGNOSIS — R932 Abnormal findings on diagnostic imaging of liver and biliary tract: Secondary | ICD-10-CM

## 2018-01-04 DIAGNOSIS — I700 Atherosclerosis of aorta: Secondary | ICD-10-CM | POA: Diagnosis not present

## 2018-01-04 DIAGNOSIS — K760 Fatty (change of) liver, not elsewhere classified: Secondary | ICD-10-CM | POA: Diagnosis not present

## 2018-01-04 DIAGNOSIS — R935 Abnormal findings on diagnostic imaging of other abdominal regions, including retroperitoneum: Secondary | ICD-10-CM | POA: Diagnosis not present

## 2018-01-04 DIAGNOSIS — D1803 Hemangioma of intra-abdominal structures: Secondary | ICD-10-CM | POA: Insufficient documentation

## 2018-01-04 LAB — POCT I-STAT CREATININE: Creatinine, Ser: 0.8 mg/dL (ref 0.44–1.00)

## 2018-01-04 MED ORDER — GADOBENATE DIMEGLUMINE 529 MG/ML IV SOLN
20.0000 mL | Freq: Once | INTRAVENOUS | Status: AC | PRN
  Administered 2018-01-04: 18 mL via INTRAVENOUS

## 2018-01-10 ENCOUNTER — Encounter: Admission: RE | Payer: Self-pay | Source: Ambulatory Visit

## 2018-01-10 ENCOUNTER — Ambulatory Visit
Admission: RE | Admit: 2018-01-10 | Payer: Medicare HMO | Source: Ambulatory Visit | Admitting: Unknown Physician Specialty

## 2018-01-10 SURGERY — COLONOSCOPY WITH PROPOFOL
Anesthesia: General

## 2018-01-21 ENCOUNTER — Encounter: Payer: Medicare HMO | Attending: *Deleted | Admitting: Dietician

## 2018-01-21 ENCOUNTER — Encounter: Payer: Self-pay | Admitting: Dietician

## 2018-01-21 VITALS — Ht 65.0 in | Wt 190.1 lb

## 2018-01-21 DIAGNOSIS — E669 Obesity, unspecified: Secondary | ICD-10-CM | POA: Insufficient documentation

## 2018-01-21 DIAGNOSIS — E6609 Other obesity due to excess calories: Secondary | ICD-10-CM

## 2018-01-21 DIAGNOSIS — K760 Fatty (change of) liver, not elsewhere classified: Secondary | ICD-10-CM | POA: Insufficient documentation

## 2018-01-21 DIAGNOSIS — Z713 Dietary counseling and surveillance: Secondary | ICD-10-CM | POA: Diagnosis present

## 2018-01-21 DIAGNOSIS — Z6832 Body mass index (BMI) 32.0-32.9, adult: Secondary | ICD-10-CM | POA: Diagnosis not present

## 2018-01-21 DIAGNOSIS — E119 Type 2 diabetes mellitus without complications: Secondary | ICD-10-CM | POA: Insufficient documentation

## 2018-01-21 NOTE — Progress Notes (Signed)
Medical Nutrition Therapy: Visit start time: 3612  end time: 1130  Assessment:  Diagnosis: fatty liver, diabetes Medical history changes: no changes  Psychosocial issues/ stress concerns: increased personal stress  Current weight: 190.1lbs with shoes  Height: 5'5" Medications, supplement changes: no changes per patient  Progress and evaluation: Decrease of 5lbs since previous visit on 12/16/17. Patient feels this is in part due to recent bout of diarrhea (IBS). Patient reports increased stress with runaway grandson and then tends to eat more. Has started working at [employer] weekly, but still feels unmotivated to increase exercise, and feels low in energy.    Physical activity: walking 10-20 minutes to pick up grandson from school  Dietary Intake:  Usual eating pattern includes 2-3 meals and 2-3 snacks per day. Dining out frequency: not assessed today.  Breakfast: oatmeal plain-adds raisins and walnuts, small amount brown sugar, milk. Sometimes with an egg Snack: none Lunch: 1/2 - 1 sandwich and fruit, sometimes cooked meal with meat, veg; sometimes skips due to lack of hunger Snack: candy recently Supper: oatmeal or sandwich, or cookies or nuts. Snack: sometimes peanut butter or fruit Beverages: water  Nutrition Care Education: Topics covered: weight control, IBS Basic nutrition: appropriate meal and snack schedule    Weight control: reviewed progress since previous visit, including food diary; encouraged patient to continue with food diary to stay mindful of intake and calorie content of foods. Reviewed benefits of low fat and low sugar foods, and/or smaller portions of higher-sugar foods. Reviewed role of exercise and strategies to improve motivation. IBS: Encouraged small meals/ snacks regularly throughout the day; discussed foods and supplements that can ease symptoms, including probiotic foods such as Kefir, psyllium fiber, possibly melatonin; encouraged patient to resume  taking B12 supplement; discussed roles of regular exercise and stress management.   Nutritional Diagnosis:  [place]-1.4 Altered GI function As related to nonalcoholic fatty liver disease.  As evidenced by MD report, overweight, and patient report. [place]-3.3 Overweight/obesity As related to history of excess calories and limited physical activity.  As evidenced by BMI 31.6, patient report.  Intervention: Discussion as noted above.   Patient will continue to work on goals to decrease snacking on unhealthy choices, and will take measures to improve IBS symptoms and increase physical activity.    Patient declined scheduling additional follow-up at this time; encouraged her to schedule later if needed and call with any questions or concerns.  Education Materials given:  Marland Kitchen Goals/ instructions  Learner/ who was taught:  . Patient   Level of understanding: Marland Kitchen Verbalizes/ demonstrates competency  Demonstrated degree of understanding via:   Teach back Learning barriers: . None  Willingness to learn/ readiness for change: . Eager, change in progress  Monitoring and Evaluation:  Dietary intake, exercise, and body weight      follow up: prn

## 2018-01-21 NOTE — Patient Instructions (Signed)
   Try some Kefir (yogurt-type drink) to help with increasing healthy bacteria in the intestines.   Consider taking a multivitamin with B12 or resume taking your B12 supplement; diarrhea can decrease absorption of this vitamin.   Keep working to reduce snacking especially in the evenings.   Continue to gradually increase some light exercise as you are able. Find tricks to increase motivation, telling yourself reasons why the exercise will help improve energy and stress symptoms.

## 2018-04-12 DIAGNOSIS — K760 Fatty (change of) liver, not elsewhere classified: Secondary | ICD-10-CM | POA: Insufficient documentation

## 2018-09-16 ENCOUNTER — Other Ambulatory Visit: Payer: Self-pay | Admitting: Family Medicine

## 2018-09-16 DIAGNOSIS — Z1231 Encounter for screening mammogram for malignant neoplasm of breast: Secondary | ICD-10-CM

## 2018-10-16 IMAGING — MG 2D DIGITAL DIAGNOSTIC BILATERAL MAMMOGRAM WITH CAD AND ADJUNCT T
9 of 14 series · 9 of 30 positions shown · non-contrast
Comparison: Previous exam(s).

CLINICAL DATA: Left lumpectomy.  Annual mammography.

EXAM:
2D DIGITAL DIAGNOSTIC BILATERAL MAMMOGRAM WITH CAD AND ADJUNCT TOMO

[L LM]
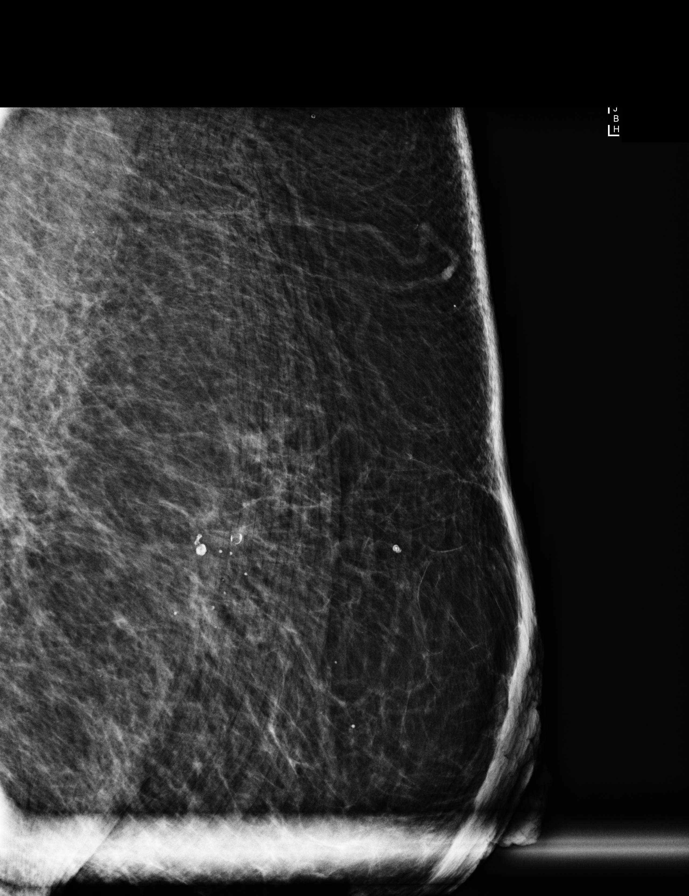

[L MLO]
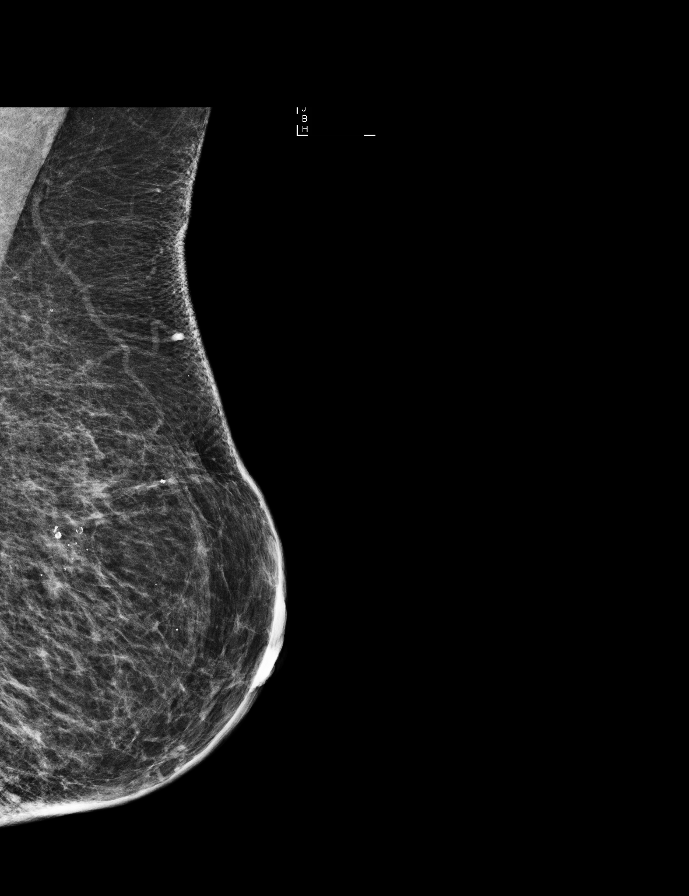

[R CC]
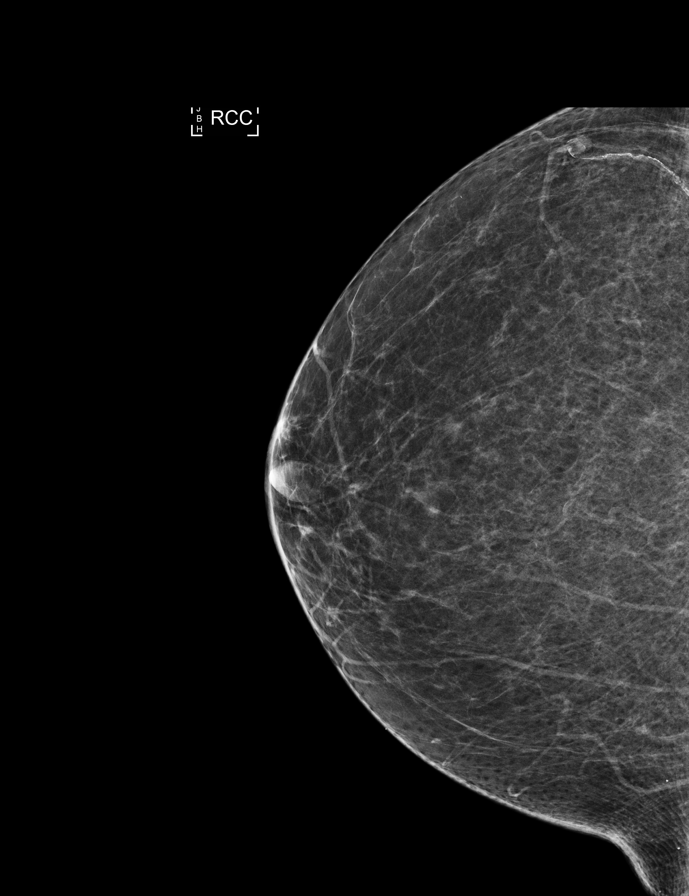

[L CC synth-2D]
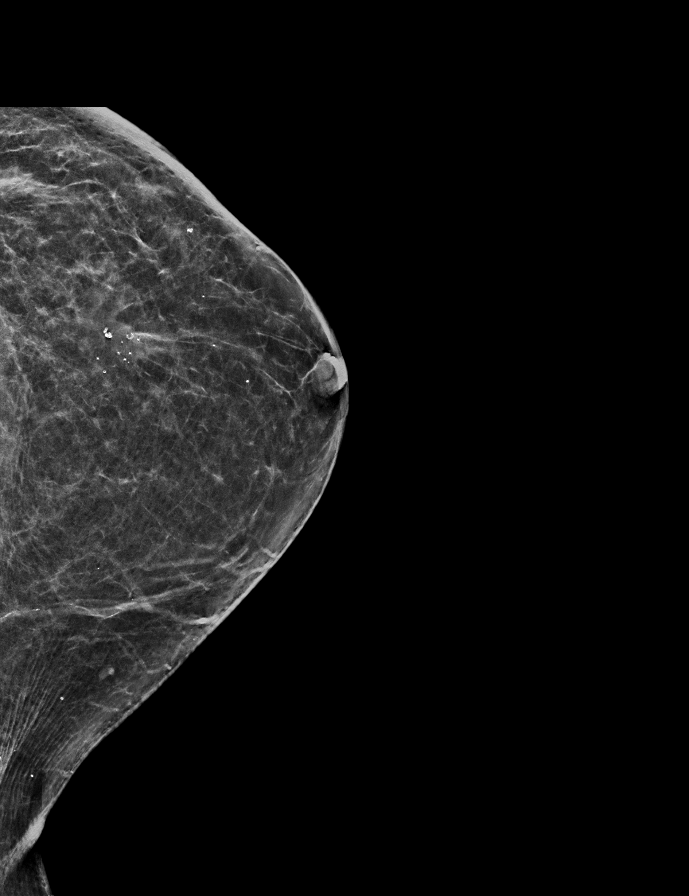

[R MLO synth-2D]
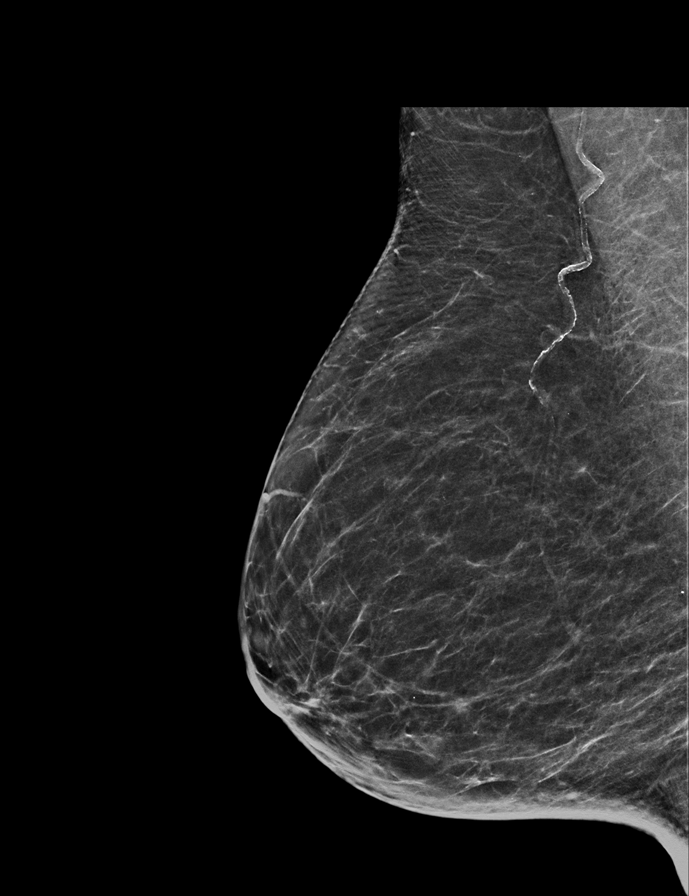

[R MLO]
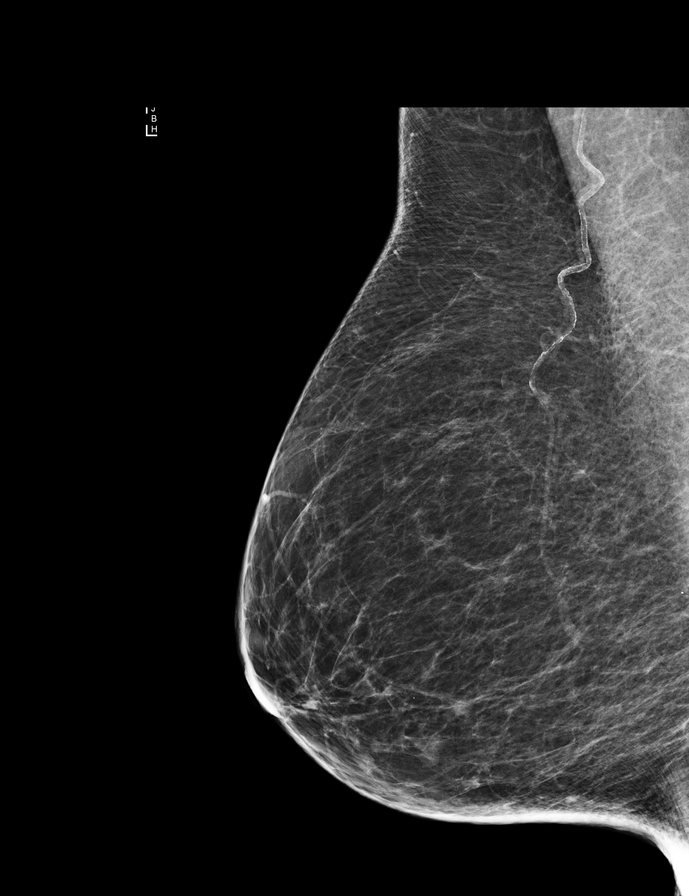

[L MLO synth-2D]
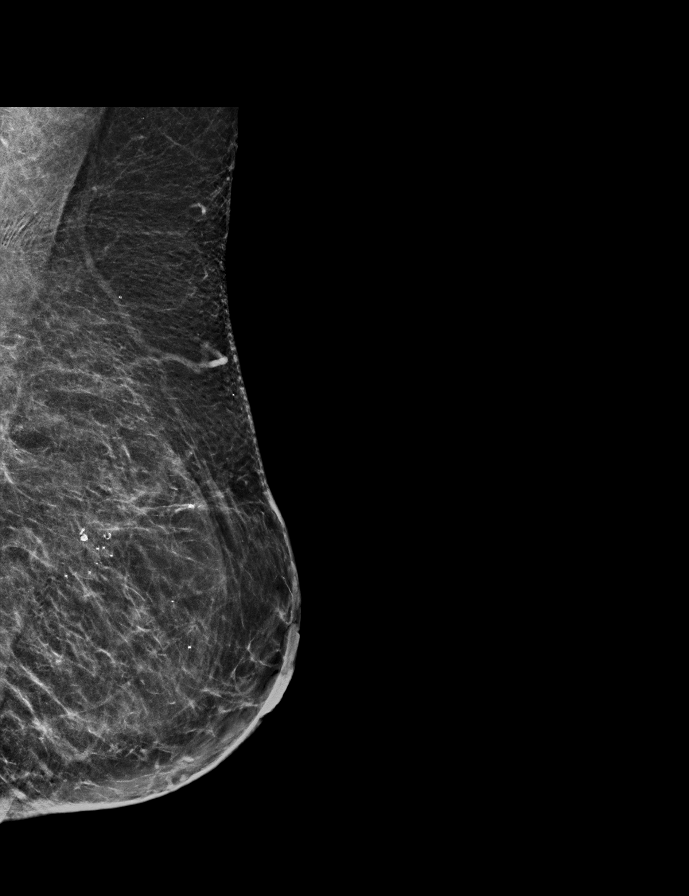

[L CC]
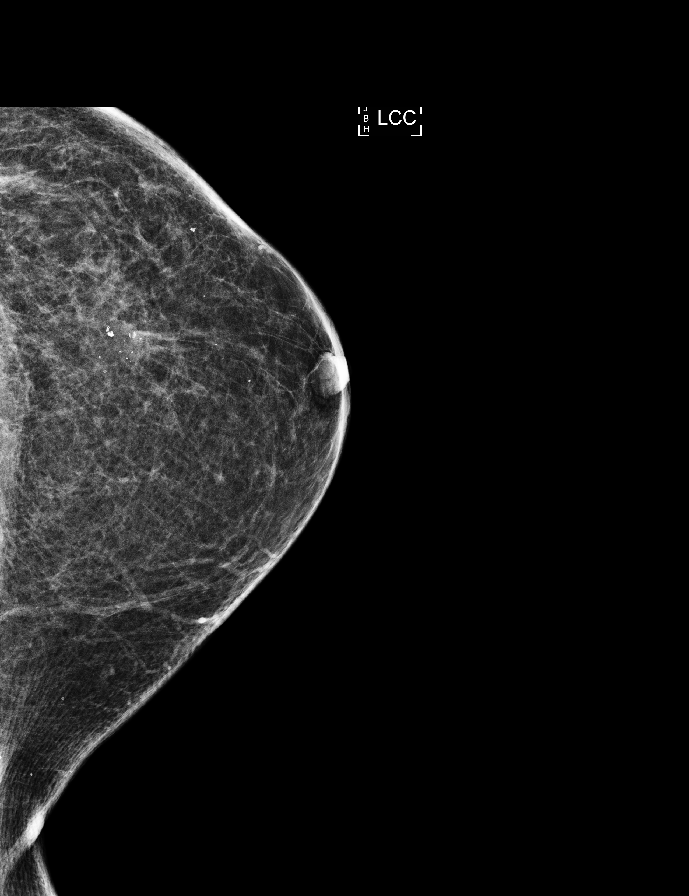

[R CC synth-2D]
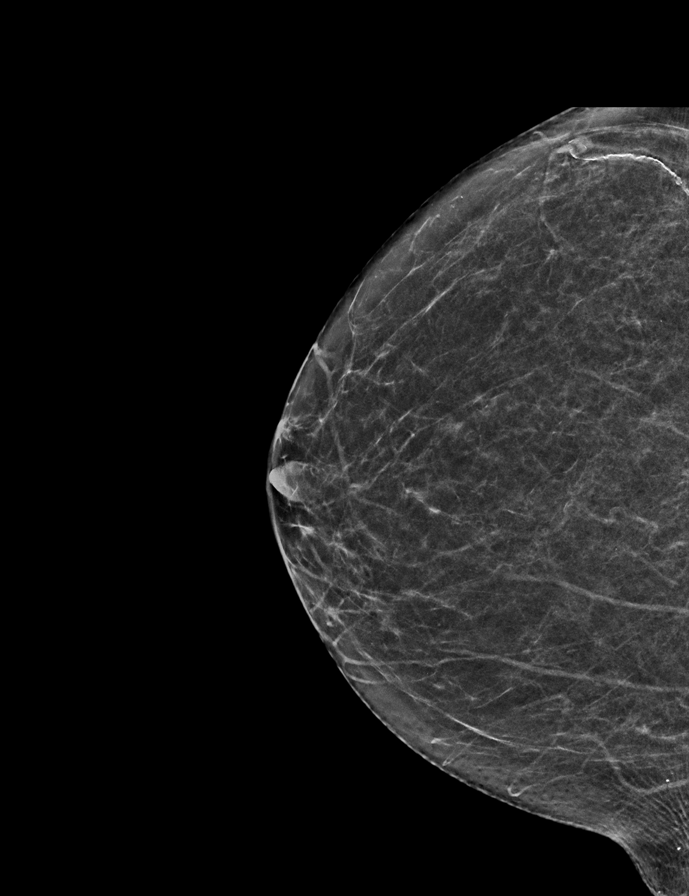

[9 of 30 positions shown; findings below may reference images not displayed]

ACR Breast Density Category b: There are scattered areas of
fibroglandular density.
FINDINGS: The left lumpectomy site is stable. No suspicious masses,
calcifications, or distortion in either breast.

Mammographic images were processed with CAD.
IMPRESSION: No mammographic evidence of malignancy.

RECOMMENDATION:
Annual screening mammography.

I have discussed the findings and recommendations with the patient.
Results were also provided in writing at the conclusion of the
visit. If applicable, a reminder letter will be sent to the patient
regarding the next appointment.

BI-RADS CATEGORY  2: Benign.

## 2018-11-15 ENCOUNTER — Ambulatory Visit
Admission: RE | Admit: 2018-11-15 | Discharge: 2018-11-15 | Disposition: A | Discharge status: Home / Self Care | Payer: Medicare HMO | Source: Ambulatory Visit | Attending: Family Medicine | Admitting: Family Medicine

## 2018-11-15 DIAGNOSIS — Z1231 Encounter for screening mammogram for malignant neoplasm of breast: Secondary | ICD-10-CM | POA: Diagnosis not present

## 2018-11-21 ENCOUNTER — Other Ambulatory Visit: Payer: Self-pay | Admitting: Family Medicine

## 2018-11-21 DIAGNOSIS — R928 Other abnormal and inconclusive findings on diagnostic imaging of breast: Secondary | ICD-10-CM

## 2018-11-21 DIAGNOSIS — N6320 Unspecified lump in the left breast, unspecified quadrant: Secondary | ICD-10-CM

## 2018-11-24 ENCOUNTER — Ambulatory Visit
Admission: RE | Admit: 2018-11-24 | Discharge: 2018-11-24 | Disposition: A | Discharge status: Home / Self Care | Payer: Medicare HMO | Source: Ambulatory Visit | Attending: Family Medicine | Admitting: Family Medicine

## 2018-11-24 DIAGNOSIS — N6320 Unspecified lump in the left breast, unspecified quadrant: Secondary | ICD-10-CM

## 2018-11-24 DIAGNOSIS — R928 Other abnormal and inconclusive findings on diagnostic imaging of breast: Secondary | ICD-10-CM | POA: Insufficient documentation

## 2018-11-28 ENCOUNTER — Other Ambulatory Visit: Payer: Self-pay | Admitting: Family Medicine

## 2018-11-28 DIAGNOSIS — N6320 Unspecified lump in the left breast, unspecified quadrant: Secondary | ICD-10-CM

## 2018-11-28 DIAGNOSIS — R928 Other abnormal and inconclusive findings on diagnostic imaging of breast: Secondary | ICD-10-CM

## 2018-12-01 ENCOUNTER — Ambulatory Visit
Admission: RE | Admit: 2018-12-01 | Discharge: 2018-12-01 | Disposition: A | Discharge status: Home / Self Care | Payer: Medicare HMO | Source: Ambulatory Visit | Attending: Family Medicine | Admitting: Family Medicine

## 2018-12-01 DIAGNOSIS — R928 Other abnormal and inconclusive findings on diagnostic imaging of breast: Secondary | ICD-10-CM | POA: Diagnosis present

## 2018-12-01 DIAGNOSIS — N6320 Unspecified lump in the left breast, unspecified quadrant: Secondary | ICD-10-CM

## 2018-12-01 HISTORY — PX: BREAST BIOPSY: SHX20

## 2018-12-06 ENCOUNTER — Other Ambulatory Visit: Payer: Self-pay

## 2018-12-06 DIAGNOSIS — C50919 Malignant neoplasm of unspecified site of unspecified female breast: Secondary | ICD-10-CM

## 2018-12-08 ENCOUNTER — Encounter: Payer: Self-pay | Admitting: Oncology

## 2018-12-08 ENCOUNTER — Other Ambulatory Visit: Payer: Self-pay

## 2018-12-08 ENCOUNTER — Inpatient Hospital Stay: Payer: Medicare HMO | Attending: Oncology | Admitting: Oncology

## 2018-12-08 ENCOUNTER — Inpatient Hospital Stay: Payer: Medicare HMO

## 2018-12-08 VITALS — BP 197/74 | HR 60 | Temp 96.7°F | Ht 65.0 in | Wt 193.0 lb

## 2018-12-08 DIAGNOSIS — Z853 Personal history of malignant neoplasm of breast: Secondary | ICD-10-CM

## 2018-12-08 DIAGNOSIS — Z170 Estrogen receptor positive status [ER+]: Secondary | ICD-10-CM | POA: Diagnosis not present

## 2018-12-08 DIAGNOSIS — C50212 Malignant neoplasm of upper-inner quadrant of left female breast: Secondary | ICD-10-CM | POA: Diagnosis present

## 2018-12-08 DIAGNOSIS — I10 Essential (primary) hypertension: Secondary | ICD-10-CM | POA: Diagnosis not present

## 2018-12-08 DIAGNOSIS — Z7982 Long term (current) use of aspirin: Secondary | ICD-10-CM

## 2018-12-08 DIAGNOSIS — E119 Type 2 diabetes mellitus without complications: Secondary | ICD-10-CM | POA: Diagnosis not present

## 2018-12-08 DIAGNOSIS — Z7984 Long term (current) use of oral hypoglycemic drugs: Secondary | ICD-10-CM

## 2018-12-08 DIAGNOSIS — Z923 Personal history of irradiation: Secondary | ICD-10-CM

## 2018-12-08 DIAGNOSIS — Z79899 Other long term (current) drug therapy: Secondary | ICD-10-CM

## 2018-12-08 DIAGNOSIS — C50912 Malignant neoplasm of unspecified site of left female breast: Secondary | ICD-10-CM

## 2018-12-08 DIAGNOSIS — Z7189 Other specified counseling: Secondary | ICD-10-CM | POA: Insufficient documentation

## 2018-12-08 NOTE — Addendum Note (Signed)
Addended by: Randa Evens C on: 12/08/2018 03:44 PM   Modules accepted: Level of Service

## 2018-12-08 NOTE — Progress Notes (Signed)
Hematology/Oncology Consult note [facility] Telephone:([phone] [phone]  Patient Care Team: [place] Pink, MD as PCP - General (Family Medicine)   Name of the patient: Jennifer Maddox  [id number]  1946-09-04    Reason for referral-new diagnosis of second left breast cancer   Referring physician-Dr. Kary Kos  Date of visit: 12/08/18   History of presenting illness-patient is a 72 year old female who was diagnosed with stage I breast cancer in 2011.  It was a T1b N0 M0 stage I tumor s/p lumpectomy.  Oncotype DX score was 7 and she did not require adjuvant chemotherapy.  She did complete adjuvant radiation and 5 years of hormone therapy.  She last saw me in November 2018 when she was referred to me for a low IgA level which was nonspecific.  She was continuing to get screening mammograms with her primary care doctor.  Recent mammogram and ultrasound on 11/24/2018 showed irregular hypoechoic mass in the left breast measuring 5 x 4 x 4 mm.  No enlarged or morphologically abnormal lymph nodes in the left axilla.  Patient underwent biopsy which showed invasive mammary carcinoma, grade 2, ER greater than 90% positive, PR greater than 90% positive.  HER-2 was equivocal by IHC.  FISH testing is currently pending.  She is here for further treatment recommendations.  She lives with her 33 year old husband and reports she is independent of her ADLs and IADLs.  She reports pain along her left rib cage.  This started about a few weeks ago and reports that she had a bout of URI and had gone through frequent sneezing spells.  Her appetite is good and she denies any unintentional weight loss.  Also reports that her son was recently diagnosed with some kind of skin cancer which was resected and also underwent lymph node dissection.  She is not sure of this was a melanoma.  She is G3, P3 L3.  No significant family history of breast cancer.  She is status post hysterectomy  ECOG  PS- 1  Pain scale- 0   Review of systems- Review of Systems  Constitutional: Negative for chills, fever, malaise/fatigue and weight loss.  HENT: Negative for congestion, ear discharge and nosebleeds.   Eyes: Negative for blurred vision.  Respiratory: Negative for cough, hemoptysis, sputum production, shortness of breath and wheezing.        Left chest wall pain  Cardiovascular: Negative for chest pain, palpitations, orthopnea and claudication.  Gastrointestinal: Negative for abdominal pain, blood in stool, constipation, diarrhea, heartburn, melena, nausea and vomiting.  Genitourinary: Negative for dysuria, flank pain, frequency, hematuria and urgency.  Musculoskeletal: Negative for back pain, joint pain and myalgias.  Skin: Negative for rash.  Neurological: Negative for dizziness, tingling, focal weakness, seizures, weakness and headaches.  Endo/Heme/Allergies: Does not bruise/bleed easily.  Psychiatric/Behavioral: Negative for depression and suicidal ideas. The patient does not have insomnia.     Allergies  Allergen Reactions  . Atorvastatin Other (See Comments)    Cramps  . Naproxen Sodium Other (See Comments)    Head and Neck Puritis    Patient Active Problem List   Diagnosis Date Noted  . Goals of care, counseling/discussion 12/08/2018  . Fatty liver disease, nonalcoholic 26/33/3545  . Heart murmur previously undiagnosed 12/15/2017  . Breast cancer, left breast ([place]) 02/18/2016  . Fecal incontinence 12/31/2014  . Irritable bowel syndrome with constipation and diarrhea 12/31/2014  . Lumbar radiculitis 06/08/2014  . DDD (degenerative disc disease), lumbar 05/21/2014  . Dyspnea on exertion 05/17/2014  .  Edema 05/17/2014  . Trochanteric bursitis of left hip 04/19/2014  . Anemia 01/23/2014  . Diverticulosis 01/23/2014  . DM type 2 (diabetes mellitus, type 2) ([place]) 01/23/2014  . H/O adenomatous polyp of colon 01/23/2014  . HTN (hypertension) 01/23/2014  . Hyperlipidemia  01/23/2014  . Osteoarthritis 01/23/2014  . Other symptoms involving digestive system(787.99) 01/23/2014  . Personal history of breast cancer 01/23/2014  . Sleep apnea 01/23/2014     Past Medical History:  Diagnosis Date  . Anemia   . Arthritis   . Breast cancer ([place]) 2011   left breast  . Breast cancer, left breast ([place]) 2011  . COPD (chronic obstructive pulmonary disease) ([place])   . Diabetes mellitus without complication ([place])   . Hyperlipidemia   . Hypertension   . Personal history of radiation therapy   . Sleep apnea   . TIA (transient ischemic attack)      Past Surgical History:  Procedure Laterality Date  . BREAST BIOPSY Left 12/01/2018   [place] bx path pending  . BREAST EXCISIONAL BIOPSY Left 2011   breast ca with radation  . COLONOSCOPY W/ POLYPECTOMY    . COLONOSCOPY WITH PROPOFOL N/A 10/29/2017   Procedure: COLONOSCOPY WITH PROPOFOL;  Surgeon: Manya Silvas, MD;  Location: [facility] ENDOSCOPY;  Service: Endoscopy;  Laterality: N/A;  . ESOPHAGOGASTRODUODENOSCOPY (EGD) WITH PROPOFOL N/A 10/29/2017   Procedure: ESOPHAGOGASTRODUODENOSCOPY (EGD) WITH PROPOFOL;  Surgeon: Manya Silvas, MD;  Location: [facility] ENDOSCOPY;  Service: Endoscopy;  Laterality: N/A;    Social History   Socioeconomic History  . Marital status: Married    Spouse name: Not on file  . Number of children: Not on file  . Years of education: Not on file  . Highest education level: Not on file  Occupational History  . Not on file  Social Needs  . Financial resource strain: Not on file  . Food insecurity:    Worry: Not on file    Inability: Not on file  . Transportation needs:    Medical: Not on file    Non-medical: Not on file  Tobacco Use  . Smoking status: Never Smoker  . Smokeless tobacco: Never Used  Substance and Sexual Activity  . Alcohol use: No  . Drug use: No  . Sexual activity: Not on file  Lifestyle  . Physical activity:    Days per week: Not on file    Minutes per session:  Not on file  . Stress: Not on file  Relationships  . Social connections:    Talks on phone: Not on file    Gets together: Not on file    Attends religious service: Not on file    Active member of club or organization: Not on file    Attends meetings of clubs or organizations: Not on file    Relationship status: Not on file  . Intimate partner violence:    Fear of current or ex partner: Not on file    Emotionally abused: Not on file    Physically abused: Not on file    Forced sexual activity: Not on file  Other Topics Concern  . Not on file  Social History Narrative  . Not on file     Family History  Problem Relation Age of Onset  . Lung cancer Mother      Current Outpatient Medications:  .  blood glucose meter kit and supplies, USE TWICE DAILY, Disp: , Rfl:  .  clobetasol ointment (TEMOVATE) 0.05 %, APPLY TWICE DAILY  WHEN FLARED THEN TWO TIMES A WEEK, Disp: , Rfl: 1 .  Docusate Calcium (STOOL SOFTENER PO), Take by mouth., Disp: , Rfl:  .  furosemide (LASIX) 20 MG tablet, Take 20 mg by mouth 2 (two) times daily. , Disp: , Rfl:  .  gabapentin (NEURONTIN) 300 MG capsule, Take 300 mg by mouth at bedtime. , Disp: , Rfl:  .  glucose blood test strip, , Disp: , Rfl:  .  Loperamide HCl (ANTI-DIARRHEAL PO), Take by mouth., Disp: , Rfl:  .  losartan (COZAAR) 50 MG tablet, Take 50 mg by mouth daily. , Disp: , Rfl:  .  Melatonin 10 MG TABS, Take by mouth at bedtime as needed., Disp: , Rfl:  .  metFORMIN (GLUCOPHAGE) 1000 MG tablet, Take 1,000 mg by mouth 2 (two) times daily with a meal. , Disp: , Rfl:  .  Multiple Vitamins-Minerals (MULTIVITAMIN ADULT PO), Take by mouth., Disp: , Rfl:  .  pravastatin (PRAVACHOL) 40 MG tablet, Take 40 mg by mouth daily. , Disp: , Rfl:  .  traZODone (DESYREL) 100 MG tablet, Take 100 mg by mouth at bedtime. , Disp: , Rfl:  .  vitamin B-12 (CYANOCOBALAMIN) 1000 MCG tablet, Take by mouth., Disp: , Rfl:  .  aspirin EC 81 MG tablet, Take by mouth., Disp: ,  Rfl:    Physical exam:  Vitals:   12/08/18 1122  BP: (!) 197/74  Pulse: 60  Temp: (!) 96.7 F (35.9 C)  TempSrc: Tympanic  Weight: 193 lb (87.5 kg)  Height: _0  (1.651 m)   Physical Exam Constitutional:      General: She is not in acute distress. HENT:     Head: Normocephalic and atraumatic.  Eyes:     Pupils: Pupils are equal, round, and reactive to light.  Neck:     Musculoskeletal: Normal range of motion.  Cardiovascular:     Rate and Rhythm: Normal rate and regular rhythm.     Heart sounds: Normal heart sounds.  Pulmonary:     Effort: Pulmonary effort is normal.     Breath sounds: Normal breath sounds.  Abdominal:     General: Bowel sounds are normal.     Palpations: Abdomen is soft.  Skin:    General: Skin is warm and dry.  Neurological:     Mental Status: She is alert and oriented to person, place, and time.    Breast exam was performed in seated and lying down position. Patient is status post left lumpectomy with a well-healed surgical scar. No evidence of any palpable masses.  There is some bruising noted at the site of biopsy.  No evidence of axillary adenopathy. No evidence of any palpable masses or lumps in the right breast. No evidence of right axillary adenopathy     CMP Latest Ref Rng & Units 01/04/2018  Glucose 65 - 99 mg/dL -  BUN 6 - 20 mg/dL -  Creatinine 0.44 - 1.00 mg/dL 0.80  Sodium 135 - 145 mmol/L -  Potassium 3.5 - 5.1 mmol/L -  Chloride 101 - 111 mmol/L -  CO2 22 - 32 mmol/L -  Calcium 8.9 - 10.3 mg/dL -  Total Protein 6.5 - 8.1 g/dL -  Total Bilirubin 0.3 - 1.2 mg/dL -  Alkaline Phos 38 - 126 U/L -  AST 15 - 41 U/L -  ALT 14 - 54 U/L -   CBC Latest Ref Rng & Units 11/09/2017  WBC 3.6 - 11.0 K/uL 4.4  Hemoglobin 12.0 -  16.0 g/dL 14.5  Hematocrit 35.0 - 47.0 % 43.1  Platelets 150 - 440 K/uL 254    No images are attached to the encounter.  US Breast Ltd Uni Left Inc Axilla  Result Date: 11/24/2018 CLINICAL DATA:  History of  LEFT breast cancer in 2011 status post breast conservation surgery. Patient returns today to evaluate a possible LEFT breast mass identified on recent screening mammogram. EXAM: DIGITAL DIAGNOSTIC LEFT MAMMOGRAM WITH CAD AND TOMO ULTRASOUND LEFT BREAST COMPARISON:  Previous exams including recent screening mammogram dated 11/15/2018. ACR Breast Density Category b: There are scattered areas of fibroglandular density. FINDINGS: There is a partially obscured mass confirmed within the retroareolar LEFT breast, slightly upper inner, approximating the anteromedial margin of the lumpectomy site, tomosynthesis spot compression CC slice 32 and spot compression MLO slice 27. Mammographic images were processed with CAD. Targeted ultrasound is performed, showing an irregular hypoechoic mass in the LEFT breast at the 11 o'clock axis, 1 cm from the nipple, with irregular and indistinct margins, measuring 5 x 4 x 4 mm, corresponding to the mammographic finding. LEFT axilla was evaluated ultrasound showing no enlarged or morphologically abnormal lymph nodes. IMPRESSION: Irregular hypoechoic mass in the LEFT breast at the 11 o'clock axis, 1 cm from the nipple, measuring 5 mm, corresponding to the mammographic finding. This is a suspicious finding for which ultrasound-guided biopsy is recommended. RECOMMENDATION: 1. Ultrasound-guided biopsy of the LEFT breast mass at the 11 o'clock axis, 1 cm from the nipple, measuring 5 mm. 2. Postprocedure mammogram to ensure sonographic and mammographic correspondence. Ordering physician will be contacted with today's results and patient will then be scheduled for ultrasound-guided core biopsy at her earliest convenience. I have discussed the findings and recommendations with the patient. Results were also provided in writing at the conclusion of the visit. If applicable, a reminder letter will be sent to the patient regarding the next appointment. BI-RADS CATEGORY  4: Suspicious. Electronically  Signed   By: Franki Cabot M.D.   On: 11/24/2018 16:16   Mm Diag Breast Tomo Uni Left  Result Date: 11/24/2018 CLINICAL DATA:  History of LEFT breast cancer in 2011 status post breast conservation surgery. Patient returns today to evaluate a possible LEFT breast mass identified on recent screening mammogram. EXAM: DIGITAL DIAGNOSTIC LEFT MAMMOGRAM WITH CAD AND TOMO ULTRASOUND LEFT BREAST COMPARISON:  Previous exams including recent screening mammogram dated 11/15/2018. ACR Breast Density Category b: There are scattered areas of fibroglandular density. FINDINGS: There is a partially obscured mass confirmed within the retroareolar LEFT breast, slightly upper inner, approximating the anteromedial margin of the lumpectomy site, tomosynthesis spot compression CC slice 32 and spot compression MLO slice 27. Mammographic images were processed with CAD. Targeted ultrasound is performed, showing an irregular hypoechoic mass in the LEFT breast at the 11 o'clock axis, 1 cm from the nipple, with irregular and indistinct margins, measuring 5 x 4 x 4 mm, corresponding to the mammographic finding. LEFT axilla was evaluated ultrasound showing no enlarged or morphologically abnormal lymph nodes. IMPRESSION: Irregular hypoechoic mass in the LEFT breast at the 11 o'clock axis, 1 cm from the nipple, measuring 5 mm, corresponding to the mammographic finding. This is a suspicious finding for which ultrasound-guided biopsy is recommended. RECOMMENDATION: 1. Ultrasound-guided biopsy of the LEFT breast mass at the 11 o'clock axis, 1 cm from the nipple, measuring 5 mm. 2. Postprocedure mammogram to ensure sonographic and mammographic correspondence. Ordering physician will be contacted with today's results and patient will then be scheduled for  ultrasound-guided core biopsy at her earliest convenience. I have discussed the findings and recommendations with the patient. Results were also provided in writing at the conclusion of the visit.  If applicable, a reminder letter will be sent to the patient regarding the next appointment. BI-RADS CATEGORY  4: Suspicious. Electronically Signed   By: Franki Cabot M.D.   On: 11/24/2018 16:16   Mm 3d Screen Breast Bilateral  Result Date: 11/15/2018 CLINICAL DATA:  Screening. Prior left lumpectomy 2011. EXAM: DIGITAL SCREENING BILATERAL MAMMOGRAM WITH TOMO AND CAD COMPARISON:  Previous exam(s). ACR Breast Density Category c: The breast tissue is heterogeneously dense, which may obscure small masses. FINDINGS: In the left breast, a possible mass seen slightly inferior and medial to the lumpectomy site warrants further evaluation. In the right breast, no findings suspicious for malignancy. Images were processed with CAD. IMPRESSION: Further evaluation is suggested for possible mass in the left breast. RECOMMENDATION: Diagnostic mammogram and possibly ultrasound of the left breast. (Code:[id number]) The patient will be contacted regarding the findings, and additional imaging will be scheduled. BI-RADS CATEGORY  0: Incomplete. Need additional imaging evaluation and/or prior mammograms for comparison. Electronically Signed   By: Everlean Alstrom M.D.   On: 11/15/2018 13:16   Mm Clip Placement Left  Result Date: 12/01/2018 CLINICAL DATA:  Status post ultrasound-guided core biopsy of mass in the 11 o'clock location of the LEFT breast and tissue marker clip placement. EXAM: DIAGNOSTIC LEFT MAMMOGRAM POST ULTRASOUND BIOPSY COMPARISON:  Previous exam(s). FINDINGS: Mammographic images were obtained following ultrasound guided biopsy of mass in the 11 o'clock location of the LEFT breast and placement of a heart shaped clip. The clip is identified within the mass in the [place] of the LEFT breast, as expected IMPRESSION: Tissue marker clip in the expected location following biopsy. Final Assessment: Post Procedure Mammograms for Marker Placement Electronically Signed   By: Nolon Nations M.D.   On:  12/01/2018 10:51   [place] Lt Breast Bx W Loc Dev 1st Lesion Img Bx Spec US Guide  Addendum Date: 12/05/2018   ADDENDUM REPORT: 12/05/2018 13:18 ADDENDUM: PATHOLOGY ADDENDUM: Pathology LEFT breast mass 11 o'clock location: Invasive mammary carcinoma Pathology concordance with imaging findings: Yes Recommendation: Consultation for treatment plan. Patient previously saw Dr. Tamala Julian. At the request of the patient, I spoke with her by telephone on 12/05/2018 at 12:17. She reports doing well after the biopsy . The nurse navigator will follow-up with the patient regarding her appointment time. Electronically Signed   By: Nolon Nations M.D.   On: 12/05/2018 13:18   Result Date: 12/05/2018 CLINICAL DATA:  Patient presents for ultrasound-guided core biopsy of mass in the 11 o'clock location of the LEFT breast. EXAM: ULTRASOUND GUIDED LEFT BREAST CORE NEEDLE BIOPSY COMPARISON:  Previous exam(s). FINDINGS: I met with the patient and we discussed the procedure of ultrasound-guided biopsy, including benefits and alternatives. We discussed the high likelihood of a successful procedure. We discussed the risks of the procedure, including infection, bleeding, tissue injury, clip migration, and inadequate sampling. Informed written consent was given. The usual time-out protocol was performed immediately prior to the procedure. Lesion quadrant: UPPER INNER QUADRANT LEFT breast Using sterile technique and 1% Lidocaine as local anesthetic, under direct ultrasound visualization, a 12 gauge spring-loaded device was used to perform biopsy of mass in the 11 o'clock location of the LEFT breast 1 centimeter from the nipple using a LATERAL to MEDIAL approach. At the conclusion of the procedure a heart shaped tissue marker clip  was deployed into the biopsy cavity. Follow up 2 view mammogram was performed and dictated separately. IMPRESSION: Ultrasound guided biopsy of LEFT breast mass. No apparent complications. Electronically Signed: By:  Nolon Nations M.D. On: 12/01/2018 10:32    Assessment and plan- Patient is a 72 y.o. female with newly diagnosed second left breast cancer stage IA cT1bcn0 cM0 ER PR positive, HER-2/neu equivocal by IHC and FISH testing pending  I discussed the results of the mammogram and ultrasound with the patient in detail.  Overall patient has a small left breast mass of 5 mm.  4 mm was seen on biopsy.  At this time I would recommend that she should proceed with upfront surgery regardless of HER-2 testing results.    Given that patient had a prior breast cancer on the same side she would need a mastectomy at this time.  I will refer her to Dr. Peyton Najjar Dr. Lysle Pearl to discuss this in more detail.  Patient is also interested in discussing breast reconstruction surgery and I will therefore refer her to Dr. Marla Roe as well.  I will tentatively see her back in 3 weeks time to discuss the results of her final pathology and further management.  Treatment will be given with a curative intent  There will also be a role for hormone therapy for 5 years down the line given that her tumor is ER PR positive and I will also discuss this in more detail at her next visit  Given that patient has had 2 breast cancers in her lifetime she would qualify for genetic testing.  She also reports that her son had recent skin cancer taken out and it is unclear that was a melanoma.  We will therefore proceed with a stat BRCA 1 and 2 testing and further genes would be tested in due course  Cancer Staging Breast cancer, left breast ([place]) Staging form: Breast, AJCC 7th Edition - Clinical: No stage assigned - Unsigned - Pathologic: Stage IA (T1b, N0, cM0) - Signed by Evlyn Kanner, NP on 02/18/2016 - Clinical stage from 12/08/2018: Stage IA (T1a, N0, M0) - Signed by Sindy Guadeloupe, MD on 12/08/2018     Thank you for this kind referral and the opportunity to participate in the care of this patient   Visit Diagnosis 1.  Malignant neoplasm of left female breast, unspecified estrogen receptor status, unspecified site of breast ([place])   2. Goals of care, counseling/discussion     Dr. Randa Evens, MD, MPH [facility] at [facility] [phone] 12/08/2018

## 2018-12-08 NOTE — Progress Notes (Signed)
  Oncology Nurse Navigator Documentation  Navigator Location: [facility]Med [facility]c (12/08/18 1500) Referral date to RadOnc/MedOnc: 12/08/18 (12/08/18 1500) )Navigator Encounter Type: Initial MedOnc (12/08/18 1500)   Abnormal Finding Date: 12/01/18 (12/06/18 1100) Confirmed Diagnosis Date: 12/05/18 (12/06/18 1100)               Patient Visit Type: Initial (12/08/18 1500)   Barriers/Navigation Needs: Coordination of Care;Education (12/06/18 1100) Education: Accessing Care/ Finding Providers;Coping with Diagnosis/ Prognosis (12/06/18 1100) Interventions: Education;Coordination of Care;Referrals (12/06/18 1100)                      Time Spent with Patient: 45 (12/08/18 1500)   Patient is known to navigators due to history of breast cancer.  Supported her at [place] visit.  Given Scientist, clinical (histocompatibility and immunogenetics). Patient wishes to discuss reconstruction options.   Scheduled with Dr. Marla Roe on /1020.  Consult to be scheduled with [facility] surgery per patient wishes.

## 2018-12-12 ENCOUNTER — Encounter: Payer: Self-pay | Admitting: Genetic Counselor

## 2018-12-12 ENCOUNTER — Telehealth: Payer: Self-pay | Admitting: Genetic Counselor

## 2018-12-12 ENCOUNTER — Telehealth: Payer: Self-pay | Admitting: *Deleted

## 2018-12-12 NOTE — Telephone Encounter (Signed)
Cancer Genetics            Telegenetics Initial Visit    Patient Name: Jennifer Maddox Patient DOB: 1946-02-03 Patient Age: 72 y.o. Phone Call Date: 12/12/2018  Referring Provider: Randa Evens, MD  Reason for Visit: Evaluate for hereditary susceptibility to cancer    Assessment and Plan:  . Jennifer Maddox's personal history of 2 primary breast cancers warrants a genetics evaluation even though her family history is not highly suggestive of a hereditary predisposition to cancer. She reports a brother with prostate cancer and a brother and son with melanoma. Otherwise, her family history is unremarkable.  . Testing is recommended to determine whether she has a pathogenic mutation that will impact her surgical decision as well as her screening and risk-reduction for cancer. A negative result will be reassuring.  . Jennifer Maddox wished to pursue genetic testing. She already had her blood drawn on 12/08/18 and sent to [facility] when she was being evaluated by Dr. Janese Banks.    . I was informed that [facility]'s STAT BRCA1/BRCA2 testing was requested as it will impact surgical decisions. Once this test is complete, analysis of additional genes on a larger hereditary cancer panel will proceed. She will be called after each result is obtained.  . Jennifer Maddox's questions were answered to her satisfaction today and she is welcome to call with any additional questions or concerns.    Thank you for the referral and allowing [place] to share in the care of your patient.   Dr. Grayland Ormond was available for questions concerning this case. Total time spent by counseling by phone was approximately 25 minutes.   _____________________________________________________________________   History of Present Illness: Jennifer Maddox, a 72 y.o. female, was referred for genetic counseling to discuss the possibility of a hereditary predisposition to cancer and discuss whether genetic testing is warranted. This was a  telegenetics visit via phone.  Jennifer Maddox was recently diagnosed with left breast cancer at the age of 62. She also has a history of a separate left breast cancer in 2011 at age 38.  She indicated that she has not yet met with a breast surgeon, but may use results of genetic testing to guide her surgical decision.  Oncology History   Stage I, T1 B. N0 M0 infiltrating ductal carcinoma of the left breast s/p partial mastectomy.  Size of tumor was 0.8 cm,  ER/PR positive and HER-2/neu negative, positive LVI. One sentinel lymph node was performed and negative. Oncotype DX recurrence score was      Breast cancer, left breast ([place])   05/14/2010 Initial Diagnosis    Breast cancer, left breast ([place])    12/08/2018 Cancer Staging    Staging form: Breast, AJCC 7th Edition - Clinical stage from 12/08/2018: Stage IA (T1a, N0, M0) - Signed by Sindy Guadeloupe, MD on 12/08/2018     Past Medical History:  Diagnosis Date  . Anemia   . Arthritis   . Breast cancer ([place]) 2011   left breast  . Breast cancer, left breast ([place]) 2011  . COPD (chronic obstructive pulmonary disease) ([place])   . Diabetes mellitus without complication ([place])   . Hyperlipidemia   . Hypertension   . Personal history of radiation therapy   . Sleep apnea   . TIA (transient ischemic attack)     Past Surgical History:  Procedure Laterality Date  . ABDOMINAL HYSTERECTOMY     around  age 57 ; ovaries intact  . BREAST BIOPSY Left 12/01/2018   Pathology LEFT breast mass 11 o'clock location: Invasive mammary  . BREAST EXCISIONAL BIOPSY Left 2011   breast ca with radation  . COLONOSCOPY W/ POLYPECTOMY    . COLONOSCOPY WITH PROPOFOL N/A 10/29/2017   Procedure: COLONOSCOPY WITH PROPOFOL;  Surgeon: Manya Silvas, MD;  Location: [facility] ENDOSCOPY;  Service: Endoscopy;  Laterality: N/A;  . ESOPHAGOGASTRODUODENOSCOPY (EGD) WITH PROPOFOL N/A 10/29/2017   Procedure: ESOPHAGOGASTRODUODENOSCOPY (EGD) WITH PROPOFOL;  Surgeon: Manya Silvas,  MD;  Location: [facility] ENDOSCOPY;  Service: Endoscopy;  Laterality: N/A;    Family History: Significant diagnoses include the following:  Family History  Problem Relation Age of Onset  . Lung cancer Mother        smoker; deceased 25  . Heart attack Father        deceased 32  . Melanoma Brother 1       currently 37  . Prostate cancer Brother 46       currently 57  . Melanoma Son 68       below left armpit; currently 93    Additionally, Jennifer Maddox has a daughter (age 22) and 2 sons (ages 72 and 75). She has 2 brothers (ages 37 and 51) and a sister (age 58s). Another sister passed away in her 73s, unrelated to cancer. Jennifer Maddox mother had 4 sisters and a brother. Jennifer Maddox father had 4 sisters and 4 brothers.  Jennifer Maddox ancestry is Caucasian - NOS. There is no known Jewish ancestry and no consanguinity.  Discussion: We reviewed the characteristics, features and inheritance patterns of hereditary cancer syndromes. We discussed her risk of harboring a mutation in the context of her personal and family history.We discussed the process of genetic testing, insurance coverage and implications of results: positive, negative and variant of uncertain significance (VUS).      Steele Berg, MS, [place] Certified Genetic Counselor phone: [phone]

## 2018-12-12 NOTE — Telephone Encounter (Signed)
Called pt to let her know that [facility] had not been able to get in touch with surgery due to the office was closed for holiday.  I called today and spoke to [facility] and she gave me appt date 12/16/18 at 1:30 with Dr. Lysle Pearl.  The patient did not understand that she has a surgery appt. And a reconstruction appt 1/10 with Dr. Marla Roe. She thought the same MD would do both things and I explained no. I did encourage her to see if Dr. Lysle Pearl and [place] could do it together but I told her that I think they do surgery at 2 different facilities but she could ask the question. She will be at the [facility] appt and she knows exactly where to go at [facility] clinic

## 2018-12-15 ENCOUNTER — Encounter: Payer: Self-pay | Admitting: Oncology

## 2018-12-15 ENCOUNTER — Other Ambulatory Visit: Payer: Self-pay | Admitting: Pathology

## 2018-12-15 ENCOUNTER — Telehealth: Payer: Self-pay | Admitting: Genetic Counselor

## 2018-12-15 LAB — SURGICAL PATHOLOGY

## 2018-12-15 NOTE — Telephone Encounter (Signed)
Cancer Genetics             Telegenetics Results Disclosure   Patient Name: Jennifer Maddox Patient DOB: Aug 14, 1946 Patient Age: 73 y.o. Phone Call Date: 12/15/2018  Referring Provider: Randa Evens, MD    Ms. Bounds was called today to discuss genetic test results. Please see the Genetics telephone note from 12/12/2018 for a detailed discussion of her personal and family histories and the recommendations provided.  Genetic Testing: At the time of Ms. Wagster's telegenetics visit, we discussed the genetic testing that was ordered by Dr. Janese Banks at [facility]. This included analysis of the BRCA1 and BRCA2 genes because results may be used to help guide her surgical decision. Once that test was completed, additional genes on a larger panel were analyzed. Both results were available on the same day. Testing which included sequencing and deletion/duplication analysis. Testing did not reveal a pathogenic mutation in any of the genes analyzed. A copy of the genetic test report will be scanned into Epic under the Media tab.  A Variant of Uncertain Significance was detected: KIT c.[id number] ([id number]). This is still considered a normal result. While at this time, it is unknown if this finding is associated with increased cancer risk, the majority of these variants get reclassified to be inconsequential. Medical management should not be based on this finding. The lab will eventually determine the significance, if any. If it gets reclassified by the lab, she will receive a new report via their secure portal, by secure email, or by mail. Ms. Kingsberry should stay in contact with [place] on a yearly basis if she is interested in knowing the status of this result and keep her address and phone number up to date in the system.  Interpretation: These results suggest that Ms. Clenney's breast cancers were most likely not due to an inherited predisposition. Most cancers happen by chance and this test, along with  details of her family history, suggests that her cancer falls into this category.   Since the current test is not perfect, it is possible that there may be a gene mutation that current testing cannot detect, but that chance is small. It is possible that a different genetic factor, which has not yet been discovered or is not on this panel, is responsible for the cancer diagnoses in the family. Again, the likelihood of this is low. No additional testing is recommended at this time for Ms. Reyez.  Genes Analyzed: The genes analyzed were the 84 genes on [facility]'s Multi-Cancer panel (AIP, ALK, APC, ATM, AXIN2, BAP1, BARD1, BLM, BMPR1A, BRCA1, BRCA2, BRIP1, CASR, CDC73, CDH1, CDK4, CDKN1B, CDKN1C, CDKN2A, CEBPA, CHEK2, CTNNA1, DICER1, DIS3L2, EGFR, EPCAM, FH, FLCN, GATA2, GPC3, GREM1, HOXB13, HRAS, KIT, MAX, MEN1, MET, MITF, MLH1, MSH2, MSH3, MSH6, MUTYH, NBN, NF1, NF2, NTHL1, PALB2, PDGFRA, PHOX2B, PMS2, POLD1, POLE, POT1, PRKAR1A, PTCH1, PTEN, RAD50, RAD51C, RAD51D, RB1, RECQL4, RET, RUNX1, SDHA, SDHAF2, SDHB, SDHC, SDHD, SMAD4, SMARCA4, SMARCB1, SMARCE1, STK11, SUFU, TERC, TERT, TMEM127, TP53, TSC1, TSC2, VHL, WRN, WT1).  Cancer Screening: Ms. Lynn is recommended to follow the cancer screening guidelines provided by her physicians.   Family Members: Family members are at some increased risk of developing cancer, over the general population risk, simply due to the family history. They are recommended to speak with their own providers about appropriate cancer screenings.   Women are recommended to have a yearly  mammogram beginning at age 40, a yearly clinical breast exam, a yearly gynecologic exam and perform monthly breast self-exams. Colon cancer screening is recommended to begin by age 50 in both men and women, unless there is a family history of colon cancer or colon polyps or an individual has a personal history to warrant initiating screening at a younger age.  Any relative who had cancer at a young  age or had a particularly rare cancer may also wish to pursue genetic testing. Genetic counselors can be located in other cities, by visiting the website of the National Society of Genetic Counselors (www.nsgc.org) and searching for a genetic counselor by zip code.   Family members are not recommended to get tested for the above VUS outside of a research protocol as this finding has no implications for their medical management.    Follow-Up: Cancer genetics is a rapidly advancing field and it is possible that new genetic tests will be appropriate for Ms. Becherer in the future. Ms. Raske is encouraged to remain in contact with Genetics on an annual basis so we can update her personal and family histories, and let her know of advances in cancer genetics that may benefit the family. Ms. Matousek's questions were answered to her satisfaction today, and she knows she is welcome to call anytime with additional questions.    Ofri Leitner, MS, CGC Certified Genetic Counselor phone: [phone]  

## 2018-12-22 ENCOUNTER — Other Ambulatory Visit: Payer: Self-pay

## 2018-12-22 NOTE — Progress Notes (Signed)
  Oncology Nurse Navigator Documentation  Navigator Location: [facility] (12/22/18 1400)   )Navigator Encounter Type: Telephone (12/22/18 1400)                     Patient Visit Type: Follow-up (12/22/18 1400)                              Time Spent with Patient: 15 (12/22/18 1400)   Phoned patient as follow-up and to remind of appointment with Dr. Janese Banks on 12/29/2018.  States she is meeting with Psychiatric nurse tomorrow to discuss options for reconstruction.

## 2018-12-23 ENCOUNTER — Ambulatory Visit (INDEPENDENT_AMBULATORY_CARE_PROVIDER_SITE_OTHER): Payer: Medicare HMO | Admitting: Plastic Surgery

## 2018-12-23 ENCOUNTER — Encounter: Payer: Self-pay | Admitting: Plastic Surgery

## 2018-12-23 VITALS — BP 154/80 | HR 60 | Temp 97.6°F | Ht 65.0 in | Wt 193.0 lb

## 2018-12-23 DIAGNOSIS — C50912 Malignant neoplasm of unspecified site of left female breast: Secondary | ICD-10-CM | POA: Diagnosis not present

## 2018-12-23 DIAGNOSIS — Z853 Personal history of malignant neoplasm of breast: Secondary | ICD-10-CM

## 2018-12-23 DIAGNOSIS — M5136 Other intervertebral disc degeneration, lumbar region: Secondary | ICD-10-CM | POA: Diagnosis not present

## 2018-12-23 NOTE — Progress Notes (Signed)
Patient ID: Jennifer Maddox, female    DOB: 03/09/1946, 73 y.o.   MRN: [id number]   Chief Complaint  Patient presents with  . Advice Only    regarding having a Mastectomy    Patient is a 73 year old white female here with her daughter for a consultation for left breast reconstruction.  She was diagnosed with left breast cancer 9 years ago and underwent a lumpectomy with postoperative radiation.  The recommendation is for a left mastectomy.  She is 5 feet 5 inches tall, weight is 193 pounds.  Preop bra 42 B.  She has a history of a cholecystectomy, hysterectomy, right knee and a right carpal tunnel release.  She has also been diagnosed with degenerative disc disease, osteoarthritis and hyperlipidemia.  She has mild ptosis of the right breast.  She would like to have symmetry should.  She is also very realistic about the risks and complications with this kind of surgery and her 73 age.   Review of Systems  Constitutional: Negative.  Negative for activity change and appetite change.  HENT: Negative.   Eyes: Negative.   Respiratory: Negative.   Cardiovascular: Negative.   Gastrointestinal: Negative.  Negative for abdominal distention.  Endocrine: Negative.   Genitourinary: Negative.   Musculoskeletal: Negative.   Neurological: Negative.   Psychiatric/Behavioral: Negative.     Past Medical History:  Diagnosis Date  . Anemia   . Arthritis   . Breast cancer ([place]) 2011   left breast  . Breast cancer, left breast ([place]) 2011  . COPD (chronic obstructive pulmonary disease) ([place])   . Diabetes mellitus without complication ([place])   . Hyperlipidemia   . Hypertension   . Personal history of radiation therapy   . Sleep apnea   . TIA (transient ischemic attack)     Past Surgical History:  Procedure Laterality Date  . ABDOMINAL HYSTERECTOMY     around age 1 ; ovaries intact  . BREAST BIOPSY Left 12/01/2018   Pathology LEFT breast mass 11 o'clock location: Invasive mammary  . BREAST  EXCISIONAL BIOPSY Left 2011   breast ca with radation  . COLONOSCOPY W/ POLYPECTOMY    . COLONOSCOPY WITH PROPOFOL N/A 10/29/2017   Procedure: COLONOSCOPY WITH PROPOFOL;  Surgeon: Manya Silvas, MD;  Location: [facility] ENDOSCOPY;  Service: Endoscopy;  Laterality: N/A;  . ESOPHAGOGASTRODUODENOSCOPY (EGD) WITH PROPOFOL N/A 10/29/2017   Procedure: ESOPHAGOGASTRODUODENOSCOPY (EGD) WITH PROPOFOL;  Surgeon: Manya Silvas, MD;  Location: [facility] ENDOSCOPY;  Service: Endoscopy;  Laterality: N/A;      Current Outpatient Medications:  .  blood glucose meter kit and supplies, USE TWICE DAILY, Disp: , Rfl:  .  furosemide (LASIX) 20 MG tablet, Take 20 mg by mouth 2 (two) times daily. , Disp: , Rfl:  .  gabapentin (NEURONTIN) 300 MG capsule, Take 300 mg by mouth at bedtime. , Disp: , Rfl:  .  glucose blood test strip, , Disp: , Rfl:  .  Loperamide HCl (ANTI-DIARRHEAL PO), Take by mouth., Disp: , Rfl:  .  Melatonin 10 MG TABS, Take by mouth at bedtime as needed., Disp: , Rfl:  .  Multiple Vitamins-Minerals (MULTIVITAMIN ADULT PO), Take by mouth., Disp: , Rfl:  .  pravastatin (PRAVACHOL) 40 MG tablet, Take 40 mg by mouth daily. , Disp: , Rfl:  .  traZODone (DESYREL) 100 MG tablet, Take 100 mg by mouth at bedtime. , Disp: , Rfl:  .  vitamin B-12 (CYANOCOBALAMIN) 1000 MCG tablet, Take by mouth., Disp: , Rfl:  .  aspirin EC 81 MG tablet, Take by mouth., Disp: , Rfl:  .  clobetasol ointment (TEMOVATE) 0.05 %, APPLY TWICE DAILY WHEN FLARED THEN TWO TIMES A WEEK, Disp: , Rfl: 1 .  Docusate Calcium (STOOL SOFTENER PO), Take by mouth., Disp: , Rfl:  .  losartan (COZAAR) 50 MG tablet, Take 50 mg by mouth daily. , Disp: , Rfl:  .  metFORMIN (GLUCOPHAGE) 1000 MG tablet, Take 1,000 mg by mouth 2 (two) times daily with a meal. , Disp: , Rfl:    Objective:   Vitals:   12/23/18 0904  BP: (!) 154/80  Pulse: 60  Temp: 97.6 F (36.4 C)  SpO2: 95%    Physical Exam Vitals signs and nursing note reviewed.    Constitutional:      Appearance: Normal appearance.  HENT:     Head: Normocephalic and atraumatic.     Nose: Nose normal.     Mouth/Throat:     Mouth: Mucous membranes are moist.  Neck:     Musculoskeletal: Normal range of motion.  Cardiovascular:     Rate and Rhythm: Normal rate.  Pulmonary:     Effort: Pulmonary effort is normal. No respiratory distress.  Abdominal:     General: Abdomen is flat. There is no distension.     Tenderness: There is no abdominal tenderness.  Neurological:     General: No focal deficit present.     Mental Status: She is alert.  Psychiatric:        Mood and Affect: Mood normal.        Thought Content: Thought content normal.        Judgment: Judgment normal.     Assessment & Plan:  Malignant neoplasm of left female breast, unspecified estrogen receptor status, unspecified site of breast ([place])  Personal history of breast cancer  DDD (degenerative disc disease), lumbar Assessment and Plan:  A long, detailed conversation was had regarding the patient's options for breast reconstruction. Five main points, which are explained to all breast reconstruction patients, were discussed.  1. Breast reconstruction is an optional process.  2. Breast reconstruction is a multi-stage process which involves multiple surgeries spaced several months apart. The entire process can take over one year.  3. The major goal of breast reconstruction is to have the patient look normal in clothing. When naked, there will always be scars.  4. Asymmetries are often present during the reconstruction process. Several operations may be needed, including surgery to the non-cancerous breast, to achieve satisfactory results.  5. No matter the reconstructive method, there are ways that the reconstruction can fail and a secondary reconstructive plan would need to be created.   A general discussion regarding all available methods of breast reconstruction were discussed. The types of  reconstructions described included.  1. Tissue expander and implant based reconstruction, both single and multi-stage approaches.  2. Autologous only reconstructions, including free abdominal-tissue based reconstructions.  3. Combination procedures, particularly latissismus dorsi flaps combined with either expanders or implants.  For each of the reconstruction methods mentioned above, the risks, benefits, alternatives, scarring, and recovery time were discussed in great detail. Specific risks detailed included bleeding, infection, hematoma, seroma, scarring, pain, wound healing complications, flap loss, fat necrosis, capsular contracture, need for implant removal, donor site complications, bulge, hernia, umbilical necrosis, need for urgent reoperation, and need for dressing changes were discussed.   Assessment  Once all reconstruction options were presented, a focused discussion was had regarding the patient's suitability for each of these  procedures.  A total of 50 minutes of face-to-face time was spent in this encounter, of which >50% was spent in counseling.  Due to the patient's previous radiation therapy to the left breast she would need autologous reconstruction of some kind.  We talked about the latissimus muscle flap with expander placement.  The patient does not want to undergo that much surgery.  We also talked about the possibility of right breast symmetry with a mastopexy reduction with or without the left breast reconstruction.  The patient has decided on no reconstruction at this time and is in agreement for the left mastectomy.  She can always return for further discussion if anything changes.  She seemed very appreciative for the time and information. [place], DO

## 2018-12-29 ENCOUNTER — Inpatient Hospital Stay: Payer: Medicare HMO | Admitting: Oncology

## 2019-01-02 ENCOUNTER — Other Ambulatory Visit: Payer: Self-pay | Admitting: General Surgery

## 2019-01-04 ENCOUNTER — Ambulatory Visit: Payer: Self-pay | Admitting: Surgery

## 2019-01-04 ENCOUNTER — Other Ambulatory Visit: Payer: Self-pay | Admitting: Surgery

## 2019-01-04 DIAGNOSIS — Z170 Estrogen receptor positive status [ER+]: Principal | ICD-10-CM

## 2019-01-04 DIAGNOSIS — C50212 Malignant neoplasm of upper-inner quadrant of left female breast: Secondary | ICD-10-CM

## 2019-01-04 NOTE — H&P (View-Only) (Signed)
Subjective:   CC: Malignant neoplasm of upper-inner quadrant of left breast in female, estrogen receptor positive (CMS-HCC) [X83.382, Z17.0] HPI:  Jennifer Maddox is a 73 y.o. female who was referred by Janese Banks for evaluation of above. Change was noted on last screening mammogram. Patient does not routinely do self breast exams.  Patient has not noted a change on breast exam. Previously dx with  T1b N0 M0 stage I tumor s/p lumpectomy and SLNB on left. Oncotype DX score was 7 and she did not require adjuvant chemotherapy. She did complete adjuvant radiation and 5 years of hormone therapy.    Past Medical History:  has a past medical history of Adrenal nodule (CMS-HCC), Anemia, Breast cancer (CMS-HCC), Breast cancer, left breast (CMS-HCC) (02/18/2016), Diverticulosis, DM type 2 (diabetes mellitus, type 2) (CMS-HCC), Fatty infiltration of liver, Fatty liver disease, nonalcoholic (04/18/3975), H/O colonoscopy with polypectomy, HTN (hypertension), Hyperlipidemia, Irritable bowel syndrome with constipation and diarrhea (12/31/2014), Osteoarthritis, Personal history of malignant neoplasm of breast, Sleep apnea, and TIA (transient ischemic attack).  Past Surgical History:  has a past surgical history that includes Hysterectomy (1977); Tonsillectomy (1952); Laparoscopic cholecystectomy w/ cholangiography (N/A, 1997); Endoscopic Carpal Tunnel Release; Breast excisional biopsy; egd (03/10/2002); Colonoscopy (01/06/2013, 12/05/2008, 03/10/2002, 07/19/2000, 08/14/1997, 07/02/1994); flexible sigmoidoscopy (06/22/1994); egd (10/29/2017); Colonoscopy (10/29/2017); and Mastectomy partial / lumpectomy (Left, 2011).  Family History: family history includes Colon cancer in her mother; Colonic polyp in her mother; Diabetes type II in her father; Gout in her father; Heart disease in her father; High blood pressure (Hypertension) in her father and mother; Lung cancer in her mother; Myocardial Infarction (Heart attack) in her  father.  Social History:  reports that she quit smoking about 34 years ago. Her smoking use included cigarettes. She quit smokeless tobacco use about 42 years ago. She reports that she does not drink alcohol or use drugs.  Current Medications: has a current medication list which includes the following prescription(s): accu-chek aviva plus meter, aspirin, blood glucose diagnostic, cyanocobalamin, furosemide, gabapentin, losartan, metformin, multivitamin, pravastatin, and trazodone.  Allergies:       Allergies as of 12/16/2018 - Reviewed 12/16/2018  Allergen Reaction Noted  . Aleve [naproxen sodium] Other (See Comments) 01/23/2014  . Atorvastatin Other (See Comments) 01/23/2014    ROS:  A 15 point review of systems was performed and was negative except as noted in HPI   Objective:   BP 143/75   Pulse 59   Temp 36.1 C (97 F) (Oral)   Ht 162.6 cm (_0 )   Wt 84.4 kg (186 lb 1.1 oz)   BMI 31.94 kg/m   Constitutional :  alert, appears stated age, cooperative and no distress  Lymphatics/Throat:  supple without significant adenopathy  Respiratory:  clear to auscultation bilaterally  Cardiovascular:  regular rate and rhythm and + murmur  Gastrointestinal: soft, non-tender; bowel sounds normal; no masses,  no organomegaly.   Musculoskeletal: Steady gait and movement  Skin: Cool and moist  Psychiatric: Normal affect, non-agitated, not confused  Breast:  Chaperone present for exam.  breasts appear normal, no suspicious masses, no skin or nipple changes or axillary nodes, former incisions noted on left.    LABS:  Reason for Addendum #1: Breast Biomarker Results Reason for Addendum #2: Molecular Genetic Test Results, FISH  SPECIMEN SUBMITTED: A. Breast, left  CLINICAL HISTORY: 73 year old female with 5 mm mass in the 11 o'clock location of left breast; hx of left breast cancer/XRT/no chemo 2011. Patient took tamoxifen x 5 years.  PRE-OPERATIVE DIAGNOSIS: Suspect  [place]  POST-OPERATIVE DIAGNOSIS: Heart shaped clip deployed     DIAGNOSIS: A. BREAST MASS, LEFT 11:00 1 CM FN; ULTRASOUND-GUIDED BIOPSY: - INVASIVE MAMMARY CARCINOMA, NO SPECIAL TYPE.  Size of invasive carcinoma: 4 mm in this sample Histologic grade of invasive carcinoma: Grade 2            Glandular/tubular differentiation score: 3            Nuclear pleomorphism score: 2            Mitotic rate score: 1            Total score: 6 Ductal carcinoma in situ: See comment Lymphovascular invasion: Not identified  ER/PR/HER2: Immunohistochemistry will be performed on block A1, with reflex to [place] for HER2 2+. The results will be reported in an addendum.  Comment: There is an area adjacent to the invasive carcinoma that may represent papillary DCIS / intraductal papillary carcinoma. The definitive grade will be assigned on the excisional specimen. These findings were communicated to [place] in Dr. Saul Fordyce office on 12/02/2018. Read back procedure was performed.     GROSS DESCRIPTION: A. Labeled: Left breast 11:00, 1 cm from nipple Received: In formalin Time/date in fixative: 10:18 AM on 12/01/2018 Cold ischemic time: Less than 1 minute Total fixation time: 8 hours Core pieces: Multiple Size: Aggregate, 1.1 x 1.0 x 0.1 cm Description: Yellow to pink cores and fragments Ink color: Blue Entirely submitted in 1 cassette.    Final Diagnosis performed by Quay Burow, MD.  Electronically signed 12/02/2018 3:50:35PM The electronic signature indicates that the named Attending Pathologist has evaluated the specimen  Technical component performed at [facility], [address], [place], [place] [address] Lab: [phone] Dir: Rush Farmer, MD, MMM Professional component performed at [facility], [facility], [place], [place],  [address] Lab: [phone] Dir: Dellia Nims. Rubinas, MD  ADDENDUM: BREAST BIOMARKER  TESTS Estrogen Receptor (ER) Status: POSITIVE            Percentage of cells with nuclear positivity:> 90%            Average intensity of staining: Strong  Progesterone Receptor (PgR) Status: POSITIVE            Percentage of cells with nuclear positivity:> 90%            Average intensity of staining: Strong  HER2 (by immunohistochemistry): EQUIVOCAL (Score 2+)            Percentage of cells with uniform intense complete membrane staining: <10 % HER2 FISH will be performed and reported in an addendum.  Cold Ischemia and Fixation Times: Meet requirements specified in latest version of the ASCO/CAP guidelines Testing Performed on Block Number(s): A1  METHODS Fixative: Formalin Estrogen Receptor: FDA cleared ([place]) Primary Antibody: SP1 Progesterone Receptor: FDA cleared ([place]) Primary Antibody: 1E2 HER2 (by IHC): FDA cleared ([place]) Primary Antibody: 4B5 ([facility]) Immunohistochemistry controls worked appropriately. Slides were prepared by [facility], [place], [place], and interpreted by Quay Burow, MD.  This test was developed and its performance characteristics determined by [facility]. It has not been cleared or approved by the [place] Food and Drug Administration. The FDA does not require this test to go through premarket FDA review. This test is used for clinical purposes. It should not be regarded as investigational or for research. This laboratory is certified under the Clinical Laboratory Improvement Amendments (CLIA) as qualified to perform high complexity clinical laboratory testing.  Addendum #1 performed by Quay Burow, MD.  Electronically signed 12/06/2018 8:07:45AM The electronic signature indicates that the named Attending Pathologist has evaluated the specimen  Technical component performed at [facility], [address] [place], [place] [address] Lab: [phone] Dir: Rush Farmer, MD, MMM  Professional component performed at [facility], [facility], [place], [place], [place] [address] Lab: [phone] Dir: Dellia Nims. Rubinas, MD  ADDENDUM: BREAST BIOMARKER TESTS HER2 (by in situ hybridization): NEGATIVE (GROUP 5) According to the 2018 ASCO/CAP guidelines for HER-2 testing of breast carcinoma, the final determination is HER-2 FISH negative. Number of observers: 2 Number of invasive tumor cells counted: 40 Dual probe assay    Average number of HER2 signals per cell: 2.2    Average number of CEP17 signals per cell: 1.9    HER2/CEP17 ratio: 1.16  Cold Ischemia and Fixation Times: Meet requirements specified in latest version of the ASCO/CAP guidelines Testing Performed on Block Number(s): A1 METHODS Fixative: Formalin HER2 (by in situ hybridization): FDA approved (DAKO HER2 IQFISH pharmDX) Testing and interpretation were performed at [facility], [place], [place].         Addendum #2 performed by Quay Burow, MD.  Electronically signed 12/15/2018 3:57:13PM The electronic signature indicates that the named Attending Pathologist has evaluated the specimen  Technical component performed at [facility], [address] [place], [place] [address] Lab: [phone] Dir: Rush Farmer, MD, MMM Professional component performed at [facility], [facility], [place], [place],  [address] Lab: [phone] Dir: Dellia Nims. Reuel Derby, MD  RADS: US Breast Ltd Uni Left Inc Axilla  Result Date: 11/24/2018 CLINICAL DATA: History of LEFT breast cancer in 2011 status post breast conservation surgery. Patient returns today to evaluate a possible LEFT breast mass identified on recent screening mammogram. EXAM: DIGITAL DIAGNOSTIC LEFT MAMMOGRAM WITH CAD AND TOMO ULTRASOUND LEFT BREAST COMPARISON: Previous exams including recent screening mammogram dated 11/15/2018. ACR Breast Density Category b: There  are scattered areas of fibroglandular density. FINDINGS: There is a partially obscured mass confirmed within the retroareolar LEFT breast, slightly upper inner, approximating the anteromedial margin of the lumpectomy site, tomosynthesis spot compression CC slice 32 and spot compression MLO slice 27. Mammographic images were processed with CAD. Targeted ultrasound is performed, showing an irregular hypoechoic mass in the LEFT breast at the 11 o'clock axis, 1 cm from the nipple, with irregular and indistinct margins, measuring 5 x 4 x 4 mm, corresponding to the mammographic finding. LEFT axilla was evaluated ultrasound showing no enlarged or morphologically abnormal lymph nodes. IMPRESSION: Irregular hypoechoic mass in the LEFT breast at the 11 o'clock axis, 1 cm from the nipple, measuring 5 mm, corresponding to the mammographic finding. This is a suspicious finding for which ultrasound-guided biopsy is recommended. RECOMMENDATION: 1. Ultrasound-guided biopsy of the LEFT breast mass at the 11 o'clock axis, 1 cm from the nipple, measuring 5 mm. 2. Postprocedure mammogram to ensure sonographic and mammographic correspondence. Ordering physician will be contacted with today's results and patient will then be scheduled for ultrasound-guided core biopsy at her earliest convenience. I have discussed the findings and recommendations with the patient. Results were also provided in writing at the conclusion of the visit. If applicable, a reminder letter will be sent to the patient regarding the next appointment. BI-RADS CATEGORY 4: Suspicious. Electronically Signed By: Franki Cabot M.D. On: 11/24/2018 16:16   Mm Diag Breast Tomo Uni Left  Result Date: 11/24/2018 CLINICAL DATA: History of LEFT breast cancer in 2011  status post breast conservation surgery. Patient returns today to evaluate a possible LEFT breast mass identified on recent screening mammogram. EXAM: DIGITAL DIAGNOSTIC LEFT MAMMOGRAM WITH CAD AND TOMO  ULTRASOUND LEFT BREAST COMPARISON: Previous exams including recent screening mammogram dated 11/15/2018. ACR Breast Density Category b: There are scattered areas of fibroglandular density. FINDINGS: There is a partially obscured mass confirmed within the retroareolar LEFT breast, slightly upper inner, approximating the anteromedial margin of the lumpectomy site, tomosynthesis spot compression CC slice 32 and spot compression MLO slice 27. Mammographic images were processed with CAD. Targeted ultrasound is performed, showing an irregular hypoechoic mass in the LEFT breast at the 11 o'clock axis, 1 cm from the nipple, with irregular and indistinct margins, measuring 5 x 4 x 4 mm, corresponding to the mammographic finding. LEFT axilla was evaluated ultrasound showing no enlarged or morphologically abnormal lymph nodes. IMPRESSION: Irregular hypoechoic mass in the LEFT breast at the 11 o'clock axis, 1 cm from the nipple, measuring 5 mm, corresponding to the mammographic finding. This is a suspicious finding for which ultrasound-guided biopsy is recommended. RECOMMENDATION: 1. Ultrasound-guided biopsy of the LEFT breast mass at the 11 o'clock axis, 1 cm from the nipple, measuring 5 mm. 2. Postprocedure mammogram to ensure sonographic and mammographic correspondence. Ordering physician will be contacted with today's results and patient will then be scheduled for ultrasound-guided core biopsy at her earliest convenience. I have discussed the findings and recommendations with the patient. Results were also provided in writing at the conclusion of the visit. If applicable, a reminder letter will be sent to the patient regarding the next appointment. BI-RADS CATEGORY 4: Suspicious. Electronically Signed By: Franki Cabot M.D. On: 11/24/2018 16:16   Mm 3d Screen Breast Bilateral  Result Date: 11/15/2018 CLINICAL DATA: Screening. Prior left lumpectomy 2011. EXAM: DIGITAL SCREENING BILATERAL MAMMOGRAM WITH TOMO AND CAD  COMPARISON: Previous exam(s). ACR Breast Density Category c: The breast tissue is heterogeneously dense, which may obscure small masses. FINDINGS: In the left breast, a possible mass seen slightly inferior and medial to the lumpectomy site warrants further evaluation. In the right breast, no findings suspicious for malignancy. Images were processed with CAD. IMPRESSION: Further evaluation is suggested for possible mass in the left breast. RECOMMENDATION: Diagnostic mammogram and possibly ultrasound of the left breast. (Code:[id number]) The patient will be contacted regarding the findings, and additional imaging will be scheduled. BI-RADS CATEGORY 0: Incomplete. Need additional imaging evaluation and/or prior mammograms for comparison. Electronically Signed By: Everlean Alstrom M.D. On: 11/15/2018 13:16   Mm Clip Placement Left  Result Date: 12/01/2018 CLINICAL DATA: Status post ultrasound-guided core biopsy of mass in the 11 o'clock location of the LEFT breast and tissue marker clip placement. EXAM: DIAGNOSTIC LEFT MAMMOGRAM POST ULTRASOUND BIOPSY COMPARISON: Previous exam(s). FINDINGS: Mammographic images were obtained following ultrasound guided biopsy of mass in the 11 o'clock location of the LEFT breast and placement of a heart shaped clip. The clip is identified within the mass in the [place] of the LEFT breast, as expected IMPRESSION: Tissue marker clip in the expected location following biopsy. Final Assessment: Post Procedure Mammograms for Marker Placement Electronically Signed By: Nolon Nations M.D. On: 12/01/2018 10:51   [place] Lt Breast Bx W Loc Dev 1st Lesion Img Bx Spec US Guide  Addendum Date: 12/05/2018  ADDENDUM REPORT: 12/05/2018 13:18 ADDENDUM: PATHOLOGY ADDENDUM: Pathology LEFT breast mass 11 o'clock location: Invasive mammary carcinoma Pathology concordance with imaging findings: Yes Recommendation: Consultation for treatment plan. Patient previously saw Dr. Tamala Julian. At the  request of the patient, I spoke with her by telephone on 12/05/2018 at 12:17. She reports doing well after the biopsy . The nurse navigator will follow-up with the patient regarding her appointment time. Electronically Signed By: Nolon Nations M.D. On: 12/05/2018 13:18   Result Date: 12/05/2018 CLINICAL DATA: Patient presents for ultrasound-guided core biopsy of mass in the 11 o'clock location of the LEFT breast. EXAM: ULTRASOUND GUIDED LEFT BREAST CORE NEEDLE BIOPSY COMPARISON: Previous exam(s). FINDINGS: I met with the patient and we discussed the procedure of ultrasound-guided biopsy, including benefits and alternatives. We discussed the high likelihood of a successful procedure. We discussed the risks of the procedure, including infection, bleeding, tissue injury, clip migration, and inadequate sampling. Informed written consent was given. The usual time-out protocol was performed immediately prior to the procedure. Lesion quadrant: UPPER INNER QUADRANT LEFT breast Using sterile technique and 1% Lidocaine as local anesthetic, under direct ultrasound visualization, a 12 gauge spring-loaded device was used to perform biopsy of mass in the 11 o'clock location of the LEFT breast 1 centimeter from the nipple using a LATERAL to MEDIAL approach. At the conclusion of the procedure a heart shaped tissue marker clip was deployed into the biopsy cavity. Follow up 2 view mammogram was performed and dictated separately. IMPRESSION: Ultrasound guided biopsy of LEFT breast mass. No apparent complications. Electronically Signed: By: Nolon Nations M.D. On: 12/01/2018 10:32    Assessment:   Malignant neoplasm of upper-inner quadrant of left breast in female, estrogen receptor positive (CMS-HCC) [C50.212, Z17.0]  Recurrent, left, ER/PR+, HER-2 negative  Plan:   1. Malignant neoplasm of upper-inner quadrant of left breast in female, estrogen receptor positive (CMS-HCC) [C50.212, Z17.0]  Recurrent, left,  ER/PR+, HER-2 negative   Consult with Plastics next week, will determine final surgical approach at that time, but will likely at least need total mastectomy due to recurrence and inability to undergo radiation therapy again.  Dx was 32yr ago, so may consider re-SLNB.  Will discuss with Dr. RJanese Banksnext week when she returns to office.  Once plans are finalized, will reach out to patient to discuss details and schedule procedures, including possible pre-operative node mapping to see if SLNB is even a possibility.  Pt and daughter verbalized understanding and all questions addressed at this time.  Will ask anesthesia for pec block preop as well

## 2019-01-04 NOTE — H&P (Signed)
Subjective:   CC: Malignant neoplasm of upper-inner quadrant of left breast in female, estrogen receptor positive (CMS-HCC) [X83.382, Z17.0] HPI:  Jennifer Maddox is a 73 y.o. female who was referred by Janese Banks for evaluation of above. Change was noted on last screening mammogram. Patient does not routinely do self breast exams.  Patient has not noted a change on breast exam. Previously dx with  T1b N0 M0 stage I tumor s/p lumpectomy and SLNB on left. Oncotype DX score was 7 and she did not require adjuvant chemotherapy. She did complete adjuvant radiation and 5 years of hormone therapy.    Past Medical History:  has a past medical history of Adrenal nodule (CMS-HCC), Anemia, Breast cancer (CMS-HCC), Breast cancer, left breast (CMS-HCC) (02/18/2016), Diverticulosis, DM type 2 (diabetes mellitus, type 2) (CMS-HCC), Fatty infiltration of liver, Fatty liver disease, nonalcoholic (04/18/3975), H/O colonoscopy with polypectomy, HTN (hypertension), Hyperlipidemia, Irritable bowel syndrome with constipation and diarrhea (12/31/2014), Osteoarthritis, Personal history of malignant neoplasm of breast, Sleep apnea, and TIA (transient ischemic attack).  Past Surgical History:  has a past surgical history that includes Hysterectomy (1977); Tonsillectomy (1952); Laparoscopic cholecystectomy w/ cholangiography (N/A, 1997); Endoscopic Carpal Tunnel Release; Breast excisional biopsy; egd (03/10/2002); Colonoscopy (01/06/2013, 12/05/2008, 03/10/2002, 07/19/2000, 08/14/1997, 07/02/1994); flexible sigmoidoscopy (06/22/1994); egd (10/29/2017); Colonoscopy (10/29/2017); and Mastectomy partial / lumpectomy (Left, 2011).  Family History: family history includes Colon cancer in her mother; Colonic polyp in her mother; Diabetes type II in her father; Gout in her father; Heart disease in her father; High blood pressure (Hypertension) in her father and mother; Lung cancer in her mother; Myocardial Infarction (Heart attack) in her  father.  Social History:  reports that she quit smoking about 34 years ago. Her smoking use included cigarettes. She quit smokeless tobacco use about 42 years ago. She reports that she does not drink alcohol or use drugs.  Current Medications: has a current medication list which includes the following prescription(s): accu-chek aviva plus meter, aspirin, blood glucose diagnostic, cyanocobalamin, furosemide, gabapentin, losartan, metformin, multivitamin, pravastatin, and trazodone.  Allergies:       Allergies as of 12/16/2018 - Reviewed 12/16/2018  Allergen Reaction Noted  . Aleve [naproxen sodium] Other (See Comments) 01/23/2014  . Atorvastatin Other (See Comments) 01/23/2014    ROS:  A 15 point review of systems was performed and was negative except as noted in HPI   Objective:   BP 143/75   Pulse 59   Temp 36.1 C (97 F) (Oral)   Ht 162.6 cm (_0 )   Wt 84.4 kg (186 lb 1.1 oz)   BMI 31.94 kg/m   Constitutional :  alert, appears stated age, cooperative and no distress  Lymphatics/Throat:  supple without significant adenopathy  Respiratory:  clear to auscultation bilaterally  Cardiovascular:  regular rate and rhythm and + murmur  Gastrointestinal: soft, non-tender; bowel sounds normal; no masses,  no organomegaly.   Musculoskeletal: Steady gait and movement  Skin: Cool and moist  Psychiatric: Normal affect, non-agitated, not confused  Breast:  Chaperone present for exam.  breasts appear normal, no suspicious masses, no skin or nipple changes or axillary nodes, former incisions noted on left.    LABS:  Reason for Addendum #1: Breast Biomarker Results Reason for Addendum #2: Molecular Genetic Test Results, FISH  SPECIMEN SUBMITTED: A. Breast, left  CLINICAL HISTORY: 73 year old female with 5 mm mass in the 11 o'clock location of left breast; hx of left breast cancer/XRT/no chemo 2011. Patient took tamoxifen x 5 years.  PRE-OPERATIVE DIAGNOSIS: Suspect  [place]  POST-OPERATIVE DIAGNOSIS: Heart shaped clip deployed     DIAGNOSIS: A. BREAST MASS, LEFT 11:00 1 CM FN; ULTRASOUND-GUIDED BIOPSY: - INVASIVE MAMMARY CARCINOMA, NO SPECIAL TYPE.  Size of invasive carcinoma: 4 mm in this sample Histologic grade of invasive carcinoma: Grade 2            Glandular/tubular differentiation score: 3            Nuclear pleomorphism score: 2            Mitotic rate score: 1            Total score: 6 Ductal carcinoma in situ: See comment Lymphovascular invasion: Not identified  ER/PR/HER2: Immunohistochemistry will be performed on block A1, with reflex to [place] for HER2 2+. The results will be reported in an addendum.  Comment: There is an area adjacent to the invasive carcinoma that may represent papillary DCIS / intraductal papillary carcinoma. The definitive grade will be assigned on the excisional specimen. These findings were communicated to [place] in Dr. Saul Fordyce office on 12/02/2018. Read back procedure was performed.     GROSS DESCRIPTION: A. Labeled: Left breast 11:00, 1 cm from nipple Received: In formalin Time/date in fixative: 10:18 AM on 12/01/2018 Cold ischemic time: Less than 1 minute Total fixation time: 8 hours Core pieces: Multiple Size: Aggregate, 1.1 x 1.0 x 0.1 cm Description: Yellow to pink cores and fragments Ink color: Blue Entirely submitted in 1 cassette.    Final Diagnosis performed by Quay Burow, MD.  Electronically signed 12/02/2018 3:50:35PM The electronic signature indicates that the named Attending Pathologist has evaluated the specimen  Technical component performed at [facility], [address], [place], [place] [address] Lab: [phone] Dir: Rush Farmer, MD, MMM Professional component performed at [facility], [facility], [place], [place],  [address] Lab: [phone] Dir: Dellia Nims. Rubinas, MD  ADDENDUM: BREAST BIOMARKER  TESTS Estrogen Receptor (ER) Status: POSITIVE            Percentage of cells with nuclear positivity:> 90%            Average intensity of staining: Strong  Progesterone Receptor (PgR) Status: POSITIVE            Percentage of cells with nuclear positivity:> 90%            Average intensity of staining: Strong  HER2 (by immunohistochemistry): EQUIVOCAL (Score 2+)            Percentage of cells with uniform intense complete membrane staining: <10 % HER2 FISH will be performed and reported in an addendum.  Cold Ischemia and Fixation Times: Meet requirements specified in latest version of the ASCO/CAP guidelines Testing Performed on Block Number(s): A1  METHODS Fixative: Formalin Estrogen Receptor: FDA cleared ([place]) Primary Antibody: SP1 Progesterone Receptor: FDA cleared ([place]) Primary Antibody: 1E2 HER2 (by IHC): FDA cleared ([place]) Primary Antibody: 4B5 ([facility]) Immunohistochemistry controls worked appropriately. Slides were prepared by [facility], [place], [place], and interpreted by Quay Burow, MD.  This test was developed and its performance characteristics determined by [facility]. It has not been cleared or approved by the [place] Food and Drug Administration. The FDA does not require this test to go through premarket FDA review. This test is used for clinical purposes. It should not be regarded as investigational or for research. This laboratory is certified under the Clinical Laboratory Improvement Amendments (CLIA) as qualified to perform high complexity clinical laboratory testing.  Addendum #1 performed by Quay Burow, MD.  Electronically signed 12/06/2018 8:07:45AM The electronic signature indicates that the named Attending Pathologist has evaluated the specimen  Technical component performed at [facility], [address] [place], [place] [address] Lab: [phone] Dir: Rush Farmer, MD, MMM  Professional component performed at [facility], [facility], [place], [place], [place] [address] Lab: [phone] Dir: Dellia Nims. Rubinas, MD  ADDENDUM: BREAST BIOMARKER TESTS HER2 (by in situ hybridization): NEGATIVE (GROUP 5) According to the 2018 ASCO/CAP guidelines for HER-2 testing of breast carcinoma, the final determination is HER-2 FISH negative. Number of observers: 2 Number of invasive tumor cells counted: 40 Dual probe assay    Average number of HER2 signals per cell: 2.2    Average number of CEP17 signals per cell: 1.9    HER2/CEP17 ratio: 1.16  Cold Ischemia and Fixation Times: Meet requirements specified in latest version of the ASCO/CAP guidelines Testing Performed on Block Number(s): A1 METHODS Fixative: Formalin HER2 (by in situ hybridization): FDA approved (DAKO HER2 IQFISH pharmDX) Testing and interpretation were performed at [facility], [place], [place].         Addendum #2 performed by Quay Burow, MD.  Electronically signed 12/15/2018 3:57:13PM The electronic signature indicates that the named Attending Pathologist has evaluated the specimen  Technical component performed at [facility], [address] [place], [place] [address] Lab: [phone] Dir: Rush Farmer, MD, MMM Professional component performed at [facility], [facility], [place], [place],  [address] Lab: [phone] Dir: Dellia Nims. Reuel Derby, MD  RADS: US Breast Ltd Uni Left Inc Axilla  Result Date: 11/24/2018 CLINICAL DATA: History of LEFT breast cancer in 2011 status post breast conservation surgery. Patient returns today to evaluate a possible LEFT breast mass identified on recent screening mammogram. EXAM: DIGITAL DIAGNOSTIC LEFT MAMMOGRAM WITH CAD AND TOMO ULTRASOUND LEFT BREAST COMPARISON: Previous exams including recent screening mammogram dated 11/15/2018. ACR Breast Density Category b: There  are scattered areas of fibroglandular density. FINDINGS: There is a partially obscured mass confirmed within the retroareolar LEFT breast, slightly upper inner, approximating the anteromedial margin of the lumpectomy site, tomosynthesis spot compression CC slice 32 and spot compression MLO slice 27. Mammographic images were processed with CAD. Targeted ultrasound is performed, showing an irregular hypoechoic mass in the LEFT breast at the 11 o'clock axis, 1 cm from the nipple, with irregular and indistinct margins, measuring 5 x 4 x 4 mm, corresponding to the mammographic finding. LEFT axilla was evaluated ultrasound showing no enlarged or morphologically abnormal lymph nodes. IMPRESSION: Irregular hypoechoic mass in the LEFT breast at the 11 o'clock axis, 1 cm from the nipple, measuring 5 mm, corresponding to the mammographic finding. This is a suspicious finding for which ultrasound-guided biopsy is recommended. RECOMMENDATION: 1. Ultrasound-guided biopsy of the LEFT breast mass at the 11 o'clock axis, 1 cm from the nipple, measuring 5 mm. 2. Postprocedure mammogram to ensure sonographic and mammographic correspondence. Ordering physician will be contacted with today's results and patient will then be scheduled for ultrasound-guided core biopsy at her earliest convenience. I have discussed the findings and recommendations with the patient. Results were also provided in writing at the conclusion of the visit. If applicable, a reminder letter will be sent to the patient regarding the next appointment. BI-RADS CATEGORY 4: Suspicious. Electronically Signed By: Franki Cabot M.D. On: 11/24/2018 16:16   Mm Diag Breast Tomo Uni Left  Result Date: 11/24/2018 CLINICAL DATA: History of LEFT breast cancer in 2011  status post breast conservation surgery. Patient returns today to evaluate a possible LEFT breast mass identified on recent screening mammogram. EXAM: DIGITAL DIAGNOSTIC LEFT MAMMOGRAM WITH CAD AND TOMO  ULTRASOUND LEFT BREAST COMPARISON: Previous exams including recent screening mammogram dated 11/15/2018. ACR Breast Density Category b: There are scattered areas of fibroglandular density. FINDINGS: There is a partially obscured mass confirmed within the retroareolar LEFT breast, slightly upper inner, approximating the anteromedial margin of the lumpectomy site, tomosynthesis spot compression CC slice 32 and spot compression MLO slice 27. Mammographic images were processed with CAD. Targeted ultrasound is performed, showing an irregular hypoechoic mass in the LEFT breast at the 11 o'clock axis, 1 cm from the nipple, with irregular and indistinct margins, measuring 5 x 4 x 4 mm, corresponding to the mammographic finding. LEFT axilla was evaluated ultrasound showing no enlarged or morphologically abnormal lymph nodes. IMPRESSION: Irregular hypoechoic mass in the LEFT breast at the 11 o'clock axis, 1 cm from the nipple, measuring 5 mm, corresponding to the mammographic finding. This is a suspicious finding for which ultrasound-guided biopsy is recommended. RECOMMENDATION: 1. Ultrasound-guided biopsy of the LEFT breast mass at the 11 o'clock axis, 1 cm from the nipple, measuring 5 mm. 2. Postprocedure mammogram to ensure sonographic and mammographic correspondence. Ordering physician will be contacted with today's results and patient will then be scheduled for ultrasound-guided core biopsy at her earliest convenience. I have discussed the findings and recommendations with the patient. Results were also provided in writing at the conclusion of the visit. If applicable, a reminder letter will be sent to the patient regarding the next appointment. BI-RADS CATEGORY 4: Suspicious. Electronically Signed By: Franki Cabot M.D. On: 11/24/2018 16:16   Mm 3d Screen Breast Bilateral  Result Date: 11/15/2018 CLINICAL DATA: Screening. Prior left lumpectomy 2011. EXAM: DIGITAL SCREENING BILATERAL MAMMOGRAM WITH TOMO AND CAD  COMPARISON: Previous exam(s). ACR Breast Density Category c: The breast tissue is heterogeneously dense, which may obscure small masses. FINDINGS: In the left breast, a possible mass seen slightly inferior and medial to the lumpectomy site warrants further evaluation. In the right breast, no findings suspicious for malignancy. Images were processed with CAD. IMPRESSION: Further evaluation is suggested for possible mass in the left breast. RECOMMENDATION: Diagnostic mammogram and possibly ultrasound of the left breast. (Code:[id number]) The patient will be contacted regarding the findings, and additional imaging will be scheduled. BI-RADS CATEGORY 0: Incomplete. Need additional imaging evaluation and/or prior mammograms for comparison. Electronically Signed By: Everlean Alstrom M.D. On: 11/15/2018 13:16   Mm Clip Placement Left  Result Date: 12/01/2018 CLINICAL DATA: Status post ultrasound-guided core biopsy of mass in the 11 o'clock location of the LEFT breast and tissue marker clip placement. EXAM: DIAGNOSTIC LEFT MAMMOGRAM POST ULTRASOUND BIOPSY COMPARISON: Previous exam(s). FINDINGS: Mammographic images were obtained following ultrasound guided biopsy of mass in the 11 o'clock location of the LEFT breast and placement of a heart shaped clip. The clip is identified within the mass in the [place] of the LEFT breast, as expected IMPRESSION: Tissue marker clip in the expected location following biopsy. Final Assessment: Post Procedure Mammograms for Marker Placement Electronically Signed By: Nolon Nations M.D. On: 12/01/2018 10:51   [place] Lt Breast Bx W Loc Dev 1st Lesion Img Bx Spec US Guide  Addendum Date: 12/05/2018  ADDENDUM REPORT: 12/05/2018 13:18 ADDENDUM: PATHOLOGY ADDENDUM: Pathology LEFT breast mass 11 o'clock location: Invasive mammary carcinoma Pathology concordance with imaging findings: Yes Recommendation: Consultation for treatment plan. Patient previously saw Dr. Tamala Julian. At the  request of the patient, I spoke with her by telephone on 12/05/2018 at 12:17. She reports doing well after the biopsy . The nurse navigator will follow-up with the patient regarding her appointment time. Electronically Signed By: Nolon Nations M.D. On: 12/05/2018 13:18   Result Date: 12/05/2018 CLINICAL DATA: Patient presents for ultrasound-guided core biopsy of mass in the 11 o'clock location of the LEFT breast. EXAM: ULTRASOUND GUIDED LEFT BREAST CORE NEEDLE BIOPSY COMPARISON: Previous exam(s). FINDINGS: I met with the patient and we discussed the procedure of ultrasound-guided biopsy, including benefits and alternatives. We discussed the high likelihood of a successful procedure. We discussed the risks of the procedure, including infection, bleeding, tissue injury, clip migration, and inadequate sampling. Informed written consent was given. The usual time-out protocol was performed immediately prior to the procedure. Lesion quadrant: UPPER INNER QUADRANT LEFT breast Using sterile technique and 1% Lidocaine as local anesthetic, under direct ultrasound visualization, a 12 gauge spring-loaded device was used to perform biopsy of mass in the 11 o'clock location of the LEFT breast 1 centimeter from the nipple using a LATERAL to MEDIAL approach. At the conclusion of the procedure a heart shaped tissue marker clip was deployed into the biopsy cavity. Follow up 2 view mammogram was performed and dictated separately. IMPRESSION: Ultrasound guided biopsy of LEFT breast mass. No apparent complications. Electronically Signed: By: Nolon Nations M.D. On: 12/01/2018 10:32    Assessment:   Malignant neoplasm of upper-inner quadrant of left breast in female, estrogen receptor positive (CMS-HCC) [C50.212, Z17.0]  Recurrent, left, ER/PR+, HER-2 negative  Plan:   1. Malignant neoplasm of upper-inner quadrant of left breast in female, estrogen receptor positive (CMS-HCC) [C50.212, Z17.0]  Recurrent, left,  ER/PR+, HER-2 negative   Consult with Plastics next week, will determine final surgical approach at that time, but will likely at least need total mastectomy due to recurrence and inability to undergo radiation therapy again.  Dx was 32yr ago, so may consider re-SLNB.  Will discuss with Dr. RJanese Banksnext week when she returns to office.  Once plans are finalized, will reach out to patient to discuss details and schedule procedures, including possible pre-operative node mapping to see if SLNB is even a possibility.  Pt and daughter verbalized understanding and all questions addressed at this time.  Will ask anesthesia for pec block preop as well

## 2019-01-08 ENCOUNTER — Encounter: Payer: Self-pay | Admitting: Oncology

## 2019-01-08 NOTE — Progress Notes (Signed)
  Oncology Nurse Navigator Documentation  Navigator Location: [facility] (01/08/19 1400)   )Navigator Encounter Type: Telephone (01/08/19 1400) Telephone: Incoming Call;Outgoing Call;Appt Confirmation/Clarification (01/08/19 1400)                   Patient Visit Type: Follow-up (01/08/19 1400)                              Time Spent with Patient: 15 (01/08/19 1400)   Patient phoned to confirm appointments for node mapping, and surgery.  Also confirmed pre-admit testing appointment/location.

## 2019-01-09 ENCOUNTER — Ambulatory Visit
Admission: RE | Admit: 2019-01-09 | Discharge: 2019-01-09 | Disposition: A | Discharge status: Home / Self Care | Payer: Medicare HMO | Source: Ambulatory Visit | Attending: Surgery | Admitting: Surgery

## 2019-01-09 DIAGNOSIS — C50212 Malignant neoplasm of upper-inner quadrant of left female breast: Secondary | ICD-10-CM | POA: Diagnosis present

## 2019-01-09 DIAGNOSIS — Z170 Estrogen receptor positive status [ER+]: Secondary | ICD-10-CM | POA: Diagnosis not present

## 2019-01-09 MED ORDER — TECHNETIUM TC 99M SULFUR COLLOID FILTERED
0.9090 | Freq: Once | INTRAVENOUS | Status: AC | PRN
  Administered 2019-01-09: 0.909 via INTRADERMAL

## 2019-01-10 ENCOUNTER — Encounter
Admission: RE | Admit: 2019-01-10 | Discharge: 2019-01-10 | Disposition: A | Discharge status: Home / Self Care | Payer: Medicare HMO | Source: Ambulatory Visit | Attending: Surgery | Admitting: Surgery

## 2019-01-10 ENCOUNTER — Other Ambulatory Visit: Payer: Self-pay | Admitting: Surgery

## 2019-01-10 ENCOUNTER — Other Ambulatory Visit: Payer: Self-pay

## 2019-01-10 DIAGNOSIS — E119 Type 2 diabetes mellitus without complications: Secondary | ICD-10-CM | POA: Insufficient documentation

## 2019-01-10 DIAGNOSIS — Z01818 Encounter for other preprocedural examination: Secondary | ICD-10-CM | POA: Insufficient documentation

## 2019-01-10 DIAGNOSIS — Z170 Estrogen receptor positive status [ER+]: Secondary | ICD-10-CM

## 2019-01-10 DIAGNOSIS — C50212 Malignant neoplasm of upper-inner quadrant of left female breast: Secondary | ICD-10-CM

## 2019-01-10 HISTORY — DX: Other intervertebral disc degeneration, lumbar region without mention of lumbar back pain or lower extremity pain: M51.369

## 2019-01-10 HISTORY — DX: Other intervertebral disc degeneration, lumbar region: M51.36

## 2019-01-10 HISTORY — DX: Diverticulosis of intestine, part unspecified, without perforation or abscess without bleeding: K57.90

## 2019-01-10 LAB — BASIC METABOLIC PANEL
Anion gap: 9 (ref 5–15)
BUN: 13 mg/dL (ref 8–23)
CO2: 31 mmol/L (ref 22–32)
Calcium: 9.6 mg/dL (ref 8.9–10.3)
Chloride: 99 mmol/L (ref 98–111)
Creatinine, Ser: 0.78 mg/dL (ref 0.44–1.00)
GFR calc Af Amer: >60 mL/min (ref >60)
GFR calc non Af Amer: >60 mL/min (ref >60)
Glucose, Bld: 103 mg/dL — ABNORMAL HIGH (ref 70–99)
Potassium: 3.6 mmol/L (ref 3.5–5.1)
Sodium: 139 mmol/L (ref 135–145)

## 2019-01-10 LAB — CBC
HCT: 42.4 % (ref 36.0–46.0)
Hemoglobin: 13.8 g/dL (ref 12.0–15.0)
MCH: 29.4 pg (ref 26.0–34.0)
MCHC: 32.5 g/dL (ref 30.0–36.0)
MCV: 90.2 fL (ref 80.0–100.0)
NRBC: 0 % (ref 0.0–0.2)
Platelets: 264 10*3/uL (ref 150–400)
RBC: 4.7 MIL/uL (ref 3.87–5.11)
RDW: 13.4 % (ref 11.5–15.5)
WBC: 4.9 10*3/uL (ref 4.0–10.5)

## 2019-01-10 NOTE — Patient Instructions (Addendum)
Your procedure is scheduled on: January 17, 2019 TUESDAY Report to Day Surgery on the 2nd floor of the [facility]. To find out your arrival time, please call [phone] between 1PM - 3PM on: Monday January 16, 2019  REMEMBER: Instructions that are not followed completely may result in serious medical risk, up to and including death; or upon the discretion of your surgeon and anesthesiologist your surgery may need to be rescheduled.  Do not eat food after midnight the night before surgery.  No gum chewing, lozengers or hard candies.  You may however, drink CLEAR liquids up to 2 hours before you are scheduled to arrive for your surgery. Do not drink anything within 2 hours of the start of your surgery.  Clear liquids include: - WATER  Do NOT drink anything that is not on this list.  Type 1 and Type 2 diabetics should only drink water.  No Alcohol for 24 hours before or after surgery.  No Smoking including e-cigarettes for 24 hours prior to surgery.  No chewable tobacco products for at least 6 hours prior to surgery.  No nicotine patches on the day of surgery.  On the morning of surgery brush your teeth with toothpaste and water, you may rinse your mouth with mouthwash if you wish. Do not swallow any toothpaste or mouthwash.  Notify your doctor if there is any change in your medical condition (cold, fever, infection).  Do not wear jewelry, make-up, hairpins, clips or nail polish.  Do not wear lotions, powders, or perfumes, deodorant.   Do not shave 48 hours prior to surgery.   Contacts and dentures may not be worn into surgery.  Do not bring valuables to the hospital, including drivers license, insurance or credit cards.  [facility] is not responsible for any belongings or valuables.   TAKE THESE MEDICATIONS THE MORNING OF SURGERY:  None  Do not losartan day of surgery   Use CHG Soap  as directed on instruction sheet.  Bring your C-PAP to the hospital with you in  case you may have to spend the night.   Stop Metformin 2 days prior to surgery. Last dose January 14, 2019  Follow recommendations from Cardiologist, Pulmonologist or PCP regarding stopping Aspirin  Stop Anti-inflammatories (NSAIDS) such as Advil, Aleve, Ibuprofen, Motrin, Naproxen, Naprosyn and Aspirin based products such as Excedrin, Goodys Powder, BC Powder. (May take Tylenol or Acetaminophen if needed.)  Stop ANY OVER THE COUNTER supplements until after surgery. Stop melatonin today. (May continue Vitamin D, Vitamin B, and multivitamin.)  Wear comfortable clothing (specific to your surgery type) to the hospital.  Plan for stool softeners for home use.  If you are being admitted to the hospital overnight, leave your suitcase in the car. After surgery it may be brought to your room.  If you are taking public transportation, you will need to have a responsible adult with you. Please confirm with your physician that it is acceptable to use public transportation.   Please call [phone] if you have any questions about these instructions.

## 2019-01-16 MED ORDER — CEFAZOLIN SODIUM-DEXTROSE 2-4 GM/100ML-% IV SOLN
2.0000 g | INTRAVENOUS | Status: AC
  Administered 2019-01-17: 2 g via INTRAVENOUS

## 2019-01-17 ENCOUNTER — Encounter
Admission: RE | Disposition: A | Discharge status: Home / Self Care | Payer: Self-pay | Source: Home / Self Care | Attending: Surgery

## 2019-01-17 ENCOUNTER — Observation Stay
Admission: RE | Admit: 2019-01-17 | Discharge: 2019-01-18 | Disposition: A | Discharge status: Home / Self Care | Payer: Medicare HMO | Attending: Surgery | Admitting: Surgery

## 2019-01-17 ENCOUNTER — Ambulatory Visit
Admission: RE | Admit: 2019-01-17 | Discharge: 2019-01-17 | Disposition: A | Discharge status: Home / Self Care | Payer: Medicare HMO | Source: Ambulatory Visit | Attending: Surgery | Admitting: Surgery

## 2019-01-17 ENCOUNTER — Other Ambulatory Visit: Payer: Self-pay

## 2019-01-17 ENCOUNTER — Ambulatory Visit: Payer: Medicare HMO | Admitting: Certified Registered Nurse Anesthetist

## 2019-01-17 DIAGNOSIS — Z87891 Personal history of nicotine dependence: Secondary | ICD-10-CM | POA: Diagnosis not present

## 2019-01-17 DIAGNOSIS — Z7982 Long term (current) use of aspirin: Secondary | ICD-10-CM | POA: Insufficient documentation

## 2019-01-17 DIAGNOSIS — E785 Hyperlipidemia, unspecified: Secondary | ICD-10-CM | POA: Insufficient documentation

## 2019-01-17 DIAGNOSIS — D225 Melanocytic nevi of trunk: Secondary | ICD-10-CM | POA: Insufficient documentation

## 2019-01-17 DIAGNOSIS — G4730 Sleep apnea, unspecified: Secondary | ICD-10-CM | POA: Insufficient documentation

## 2019-01-17 DIAGNOSIS — Z9012 Acquired absence of left breast and nipple: Secondary | ICD-10-CM | POA: Insufficient documentation

## 2019-01-17 DIAGNOSIS — Z8673 Personal history of transient ischemic attack (TIA), and cerebral infarction without residual deficits: Secondary | ICD-10-CM | POA: Diagnosis not present

## 2019-01-17 DIAGNOSIS — N6342 Unspecified lump in left breast, subareolar: Secondary | ICD-10-CM | POA: Insufficient documentation

## 2019-01-17 DIAGNOSIS — E119 Type 2 diabetes mellitus without complications: Secondary | ICD-10-CM | POA: Insufficient documentation

## 2019-01-17 DIAGNOSIS — Z90710 Acquired absence of both cervix and uterus: Secondary | ICD-10-CM | POA: Insufficient documentation

## 2019-01-17 DIAGNOSIS — M1990 Unspecified osteoarthritis, unspecified site: Secondary | ICD-10-CM | POA: Diagnosis not present

## 2019-01-17 DIAGNOSIS — Z8249 Family history of ischemic heart disease and other diseases of the circulatory system: Secondary | ICD-10-CM | POA: Diagnosis not present

## 2019-01-17 DIAGNOSIS — Z888 Allergy status to other drugs, medicaments and biological substances status: Secondary | ICD-10-CM | POA: Insufficient documentation

## 2019-01-17 DIAGNOSIS — Z923 Personal history of irradiation: Secondary | ICD-10-CM | POA: Insufficient documentation

## 2019-01-17 DIAGNOSIS — Z886 Allergy status to analgesic agent status: Secondary | ICD-10-CM | POA: Diagnosis not present

## 2019-01-17 DIAGNOSIS — K589 Irritable bowel syndrome without diarrhea: Secondary | ICD-10-CM | POA: Diagnosis not present

## 2019-01-17 DIAGNOSIS — J449 Chronic obstructive pulmonary disease, unspecified: Secondary | ICD-10-CM | POA: Diagnosis not present

## 2019-01-17 DIAGNOSIS — Z170 Estrogen receptor positive status [ER+]: Secondary | ICD-10-CM | POA: Diagnosis not present

## 2019-01-17 DIAGNOSIS — C50919 Malignant neoplasm of unspecified site of unspecified female breast: Secondary | ICD-10-CM | POA: Diagnosis present

## 2019-01-17 DIAGNOSIS — I10 Essential (primary) hypertension: Secondary | ICD-10-CM | POA: Diagnosis not present

## 2019-01-17 DIAGNOSIS — Z79899 Other long term (current) drug therapy: Secondary | ICD-10-CM | POA: Diagnosis not present

## 2019-01-17 DIAGNOSIS — K760 Fatty (change of) liver, not elsewhere classified: Secondary | ICD-10-CM | POA: Diagnosis not present

## 2019-01-17 DIAGNOSIS — C50212 Malignant neoplasm of upper-inner quadrant of left female breast: Principal | ICD-10-CM | POA: Insufficient documentation

## 2019-01-17 DIAGNOSIS — Z853 Personal history of malignant neoplasm of breast: Secondary | ICD-10-CM | POA: Insufficient documentation

## 2019-01-17 DIAGNOSIS — Z7984 Long term (current) use of oral hypoglycemic drugs: Secondary | ICD-10-CM | POA: Insufficient documentation

## 2019-01-17 DIAGNOSIS — C50412 Malignant neoplasm of upper-outer quadrant of left female breast: Secondary | ICD-10-CM

## 2019-01-17 HISTORY — PX: TOTAL MASTECTOMY: SHX6129

## 2019-01-17 HISTORY — PX: SENTINEL NODE BIOPSY: SHX6608

## 2019-01-17 LAB — GLUCOSE, CAPILLARY
GLUCOSE-CAPILLARY: 96 mg/dL (ref 70–99)
Glucose-Capillary: 102 mg/dL — ABNORMAL HIGH (ref 70–99)

## 2019-01-17 SURGERY — MASTECTOMY, SIMPLE
Anesthesia: General | Laterality: Left

## 2019-01-17 MED ORDER — MIDAZOLAM HCL 2 MG/2ML IJ SOLN
INTRAMUSCULAR | Status: AC
  Administered 2019-01-17: 1 mg via INTRAVENOUS
  Filled 2019-01-17: qty 2

## 2019-01-17 MED ORDER — SODIUM CHLORIDE FLUSH 0.9 % IV SOLN
INTRAVENOUS | Status: AC
  Filled 2019-01-17: qty 10

## 2019-01-17 MED ORDER — PROPOFOL 500 MG/50ML IV EMUL
INTRAVENOUS | Status: DC | PRN
  Administered 2019-01-17: 15 ug/kg/min via INTRAVENOUS

## 2019-01-17 MED ORDER — PROPOFOL 10 MG/ML IV BOLUS
INTRAVENOUS | Status: DC | PRN
  Administered 2019-01-17: 150 mg via INTRAVENOUS

## 2019-01-17 MED ORDER — ONDANSETRON HCL 4 MG/2ML IJ SOLN
INTRAMUSCULAR | Status: AC
  Filled 2019-01-17: qty 2

## 2019-01-17 MED ORDER — PROMETHAZINE HCL 25 MG/ML IJ SOLN: 6.2500 mg | INTRAMUSCULAR | Status: DC | PRN

## 2019-01-17 MED ORDER — OXYCODONE HCL 5 MG/5ML PO SOLN: 5.0000 mg | Freq: Once | ORAL | Status: DC | PRN

## 2019-01-17 MED ORDER — PRAVASTATIN SODIUM 20 MG PO TABS
40.0000 mg | ORAL_TABLET | Freq: Every day | ORAL | Status: DC
  Administered 2019-01-18: 40 mg via ORAL
  Filled 2019-01-17: qty 1
  Filled 2019-01-17: qty 2

## 2019-01-17 MED ORDER — SODIUM CHLORIDE 0.9 % IV SOLN
INTRAVENOUS | Status: DC
  Administered 2019-01-17 (×2): via INTRAVENOUS

## 2019-01-17 MED ORDER — FENTANYL CITRATE (PF) 100 MCG/2ML IJ SOLN
INTRAMUSCULAR | Status: AC
  Administered 2019-01-17: 25 ug via INTRAVENOUS
  Filled 2019-01-17: qty 2

## 2019-01-17 MED ORDER — LOSARTAN POTASSIUM 50 MG PO TABS
50.0000 mg | ORAL_TABLET | Freq: Every day | ORAL | Status: DC
  Administered 2019-01-17 – 2019-01-18 (×2): 50 mg via ORAL
  Filled 2019-01-17 (×2): qty 1

## 2019-01-17 MED ORDER — OXYCODONE HCL 5 MG PO TABS: 5.0000 mg | ORAL_TABLET | Freq: Once | ORAL | Status: DC | PRN

## 2019-01-17 MED ORDER — BUPIVACAINE LIPOSOME 1.3 % IJ SUSP
INTRAMUSCULAR | Status: AC
  Filled 2019-01-17: qty 20

## 2019-01-17 MED ORDER — CHLORHEXIDINE GLUCONATE CLOTH 2 % EX PADS: 6.0000 | MEDICATED_PAD | Freq: Once | CUTANEOUS | Status: DC

## 2019-01-17 MED ORDER — LIDOCAINE HCL (PF) 2 % IJ SOLN
INTRAMUSCULAR | Status: AC
  Filled 2019-01-17: qty 10

## 2019-01-17 MED ORDER — FUROSEMIDE 20 MG PO TABS
20.0000 mg | ORAL_TABLET | Freq: Two times a day (BID) | ORAL | Status: DC
  Administered 2019-01-17 – 2019-01-18 (×2): 20 mg via ORAL
  Filled 2019-01-17 (×2): qty 1

## 2019-01-17 MED ORDER — FENTANYL CITRATE (PF) 100 MCG/2ML IJ SOLN
INTRAMUSCULAR | Status: AC
  Filled 2019-01-17: qty 2

## 2019-01-17 MED ORDER — FAMOTIDINE 20 MG PO TABS
ORAL_TABLET | ORAL | Status: AC
  Administered 2019-01-17: 20 mg via ORAL
  Filled 2019-01-17: qty 1

## 2019-01-17 MED ORDER — FENTANYL CITRATE (PF) 100 MCG/2ML IJ SOLN
50.0000 ug | Freq: Once | INTRAMUSCULAR | Status: AC
  Administered 2019-01-17: 50 ug via INTRAVENOUS

## 2019-01-17 MED ORDER — ACETAMINOPHEN 325 MG PO TABS
650.0000 mg | ORAL_TABLET | Freq: Four times a day (QID) | ORAL | Status: DC | PRN
  Administered 2019-01-18: 650 mg via ORAL
  Filled 2019-01-17: qty 2

## 2019-01-17 MED ORDER — HYDROCODONE-ACETAMINOPHEN 5-325 MG PO TABS: 1.0000 | ORAL_TABLET | ORAL | Status: DC | PRN

## 2019-01-17 MED ORDER — LIDOCAINE HCL 1 % IJ SOLN
INTRAMUSCULAR | Status: DC | PRN
  Administered 2019-01-17: 19 mL via INTRAMUSCULAR

## 2019-01-17 MED ORDER — EPHEDRINE SULFATE 50 MG/ML IJ SOLN
INTRAMUSCULAR | Status: AC
  Filled 2019-01-17: qty 1

## 2019-01-17 MED ORDER — RISAQUAD PO CAPS
1.0000 | ORAL_CAPSULE | Freq: Every day | ORAL | Status: DC
  Administered 2019-01-18: 1 via ORAL
  Filled 2019-01-17: qty 1

## 2019-01-17 MED ORDER — GLYCOPYRROLATE 0.2 MG/ML IJ SOLN
INTRAMUSCULAR | Status: DC | PRN
  Administered 2019-01-17: 0.2 mg via INTRAVENOUS

## 2019-01-17 MED ORDER — ARTIFICIAL TEARS OPHTHALMIC OINT
TOPICAL_OINTMENT | OPHTHALMIC | Status: AC
  Filled 2019-01-17: qty 3.5

## 2019-01-17 MED ORDER — FENTANYL CITRATE (PF) 100 MCG/2ML IJ SOLN
INTRAMUSCULAR | Status: DC | PRN
  Administered 2019-01-17 (×3): 50 ug via INTRAVENOUS

## 2019-01-17 MED ORDER — SUCCINYLCHOLINE CHLORIDE 20 MG/ML IJ SOLN
INTRAMUSCULAR | Status: AC
  Filled 2019-01-17: qty 1

## 2019-01-17 MED ORDER — SEVOFLURANE IN SOLN
RESPIRATORY_TRACT | Status: AC
  Filled 2019-01-17: qty 250

## 2019-01-17 MED ORDER — PROPOFOL 10 MG/ML IV BOLUS
INTRAVENOUS | Status: AC
  Filled 2019-01-17: qty 40

## 2019-01-17 MED ORDER — ONDANSETRON 4 MG PO TBDP: 4.0000 mg | ORAL_TABLET | Freq: Four times a day (QID) | ORAL | Status: DC | PRN

## 2019-01-17 MED ORDER — FENTANYL CITRATE (PF) 100 MCG/2ML IJ SOLN
25.0000 ug | INTRAMUSCULAR | Status: DC | PRN
  Administered 2019-01-17: 25 ug via INTRAVENOUS

## 2019-01-17 MED ORDER — MEPERIDINE HCL 50 MG/ML IJ SOLN: 6.2500 mg | INTRAMUSCULAR | Status: DC | PRN

## 2019-01-17 MED ORDER — VITAMIN B-12 1000 MCG PO TABS
2000.0000 ug | ORAL_TABLET | Freq: Every day | ORAL | Status: DC
  Administered 2019-01-18: 2000 ug via ORAL
  Filled 2019-01-17: qty 2

## 2019-01-17 MED ORDER — LIDOCAINE HCL (PF) 1 % IJ SOLN
INTRAMUSCULAR | Status: AC
  Filled 2019-01-17: qty 5

## 2019-01-17 MED ORDER — TRAZODONE HCL 50 MG PO TABS
150.0000 mg | ORAL_TABLET | Freq: Every day | ORAL | Status: DC
  Administered 2019-01-17: 150 mg via ORAL
  Filled 2019-01-17: qty 1

## 2019-01-17 MED ORDER — TRAMADOL HCL 50 MG PO TABS: 50.0000 mg | ORAL_TABLET | Freq: Four times a day (QID) | ORAL | Status: DC | PRN

## 2019-01-17 MED ORDER — BUPIVACAINE HCL (PF) 0.5 % IJ SOLN
INTRAMUSCULAR | Status: AC
  Filled 2019-01-17: qty 10

## 2019-01-17 MED ORDER — BUPIVACAINE LIPOSOME 1.3 % IJ SUSP
INTRAMUSCULAR | Status: DC | PRN
  Administered 2019-01-17: 20 mL via PERINEURAL

## 2019-01-17 MED ORDER — FAMOTIDINE 20 MG PO TABS
20.0000 mg | ORAL_TABLET | Freq: Once | ORAL | Status: AC
  Administered 2019-01-17: 20 mg via ORAL

## 2019-01-17 MED ORDER — ACETAMINOPHEN 500 MG PO TABS
1000.0000 mg | ORAL_TABLET | ORAL | Status: AC
  Administered 2019-01-17: 1000 mg via ORAL

## 2019-01-17 MED ORDER — ONDANSETRON HCL 4 MG/2ML IJ SOLN: 4.0000 mg | Freq: Four times a day (QID) | INTRAMUSCULAR | Status: DC | PRN

## 2019-01-17 MED ORDER — CEFAZOLIN SODIUM-DEXTROSE 2-4 GM/100ML-% IV SOLN
INTRAVENOUS | Status: AC
  Filled 2019-01-17: qty 100

## 2019-01-17 MED ORDER — GABAPENTIN 300 MG PO CAPS
300.0000 mg | ORAL_CAPSULE | Freq: Every day | ORAL | Status: DC
  Administered 2019-01-17: 300 mg via ORAL
  Filled 2019-01-17: qty 1

## 2019-01-17 MED ORDER — MIDAZOLAM HCL 2 MG/2ML IJ SOLN
1.0000 mg | Freq: Once | INTRAMUSCULAR | Status: AC
  Administered 2019-01-17: 1 mg via INTRAVENOUS

## 2019-01-17 MED ORDER — SODIUM CHLORIDE FLUSH 0.9 % IV SOLN
INTRAVENOUS | Status: AC
  Filled 2019-01-17: qty 30

## 2019-01-17 MED ORDER — METFORMIN HCL 500 MG PO TABS
1000.0000 mg | ORAL_TABLET | Freq: Two times a day (BID) | ORAL | Status: DC
  Administered 2019-01-17 – 2019-01-18 (×2): 1000 mg via ORAL
  Filled 2019-01-17 (×2): qty 2

## 2019-01-17 MED ORDER — LIDOCAINE HCL (PF) 1 % IJ SOLN
INTRAMUSCULAR | Status: DC | PRN
  Administered 2019-01-17: 3 mL

## 2019-01-17 MED ORDER — MORPHINE SULFATE (PF) 4 MG/ML IV SOLN
1.0000 mg | INTRAVENOUS | Status: DC | PRN
  Administered 2019-01-18: 1 mg via INTRAVENOUS
  Filled 2019-01-17: qty 1

## 2019-01-17 MED ORDER — BUPIVACAINE HCL (PF) 0.5 % IJ SOLN
INTRAMUSCULAR | Status: AC
  Filled 2019-01-17: qty 30

## 2019-01-17 MED ORDER — ACETAMINOPHEN 500 MG PO TABS
ORAL_TABLET | ORAL | Status: AC
  Administered 2019-01-17: 1000 mg via ORAL
  Filled 2019-01-17: qty 2

## 2019-01-17 MED ORDER — ACETAMINOPHEN 650 MG RE SUPP: 650.0000 mg | Freq: Four times a day (QID) | RECTAL | Status: DC | PRN

## 2019-01-17 MED ORDER — FENTANYL CITRATE (PF) 100 MCG/2ML IJ SOLN
INTRAMUSCULAR | Status: AC
  Administered 2019-01-17: 50 ug via INTRAVENOUS
  Filled 2019-01-17: qty 2

## 2019-01-17 MED ORDER — BUPIVACAINE HCL (PF) 0.5 % IJ SOLN
INTRAMUSCULAR | Status: DC | PRN
  Administered 2019-01-17: 10 mL via PERINEURAL

## 2019-01-17 MED ORDER — TECHNETIUM TC 99M SULFUR COLLOID FILTERED
0.7960 | Freq: Once | INTRAVENOUS | Status: AC | PRN
  Administered 2019-01-17: 0.796 via INTRADERMAL

## 2019-01-17 MED ORDER — LIDOCAINE HCL (PF) 1 % IJ SOLN
INTRAMUSCULAR | Status: AC
  Filled 2019-01-17: qty 30

## 2019-01-17 MED ORDER — METHYLENE BLUE 0.5 % INJ SOLN
INTRAVENOUS | Status: AC
  Filled 2019-01-17: qty 10

## 2019-01-17 MED ORDER — SUCCINYLCHOLINE CHLORIDE 20 MG/ML IJ SOLN
INTRAMUSCULAR | Status: DC | PRN
  Administered 2019-01-17: 100 mg via INTRAVENOUS

## 2019-01-17 MED ORDER — EPHEDRINE SULFATE 50 MG/ML IJ SOLN
INTRAMUSCULAR | Status: DC | PRN
  Administered 2019-01-17 (×3): 5 mg via INTRAVENOUS
  Administered 2019-01-17: 10 mg via INTRAVENOUS

## 2019-01-17 MED ORDER — LIDOCAINE HCL (CARDIAC) PF 100 MG/5ML IV SOSY
PREFILLED_SYRINGE | INTRAVENOUS | Status: DC | PRN
  Administered 2019-01-17: 100 mg via INTRAVENOUS

## 2019-01-17 MED ORDER — IBUPROFEN 600 MG PO TABS
600.0000 mg | ORAL_TABLET | Freq: Four times a day (QID) | ORAL | Status: DC | PRN
  Administered 2019-01-18: 600 mg via ORAL
  Filled 2019-01-17 (×2): qty 1

## 2019-01-17 MED ORDER — SENNOSIDES-DOCUSATE SODIUM 8.6-50 MG PO TABS: 1.0000 | ORAL_TABLET | Freq: Every evening | ORAL | Status: DC | PRN

## 2019-01-17 MED ORDER — GLYCOPYRROLATE 0.2 MG/ML IJ SOLN
INTRAMUSCULAR | Status: AC
  Filled 2019-01-17: qty 1

## 2019-01-17 SURGICAL SUPPLY — 58 items
APPLIER CLIP 11 MED OPEN (CLIP)
BINDER BREAST XLRG (GAUZE/BANDAGES/DRESSINGS) ×2 IMPLANT
BLADE SURG 15 STRL LF DISP TIS (BLADE) ×1 IMPLANT
BLADE SURG 15 STRL SS (BLADE) ×2
BULB RESERV EVAC DRAIN JP 100C (MISCELLANEOUS) ×2 IMPLANT
CANISTER SUCT 1200ML W/VALVE (MISCELLANEOUS) ×3 IMPLANT
CHLORAPREP W/TINT 26ML (MISCELLANEOUS) ×3 IMPLANT
CLIP APPLIE 11 MED OPEN (CLIP) IMPLANT
CNTNR SPEC 2.5X3XGRAD LEK (MISCELLANEOUS) ×2
CONT SPEC 4OZ STER OR WHT (MISCELLANEOUS) ×4
CONTAINER SPEC 2.5X3XGRAD LEK (MISCELLANEOUS) ×2 IMPLANT
COVER WAND RF STERILE (DRAPES) ×1 IMPLANT
DERMABOND ADVANCED (GAUZE/BANDAGES/DRESSINGS) ×4
DERMABOND ADVANCED .7 DNX12 (GAUZE/BANDAGES/DRESSINGS) ×1 IMPLANT
DEVICE DUBIN SPECIMEN MAMMOGRA (MISCELLANEOUS) ×1 IMPLANT
DRAIN CHANNEL JP 15F RND 16 (MISCELLANEOUS) ×2 IMPLANT
DRAPE LAPAROTOMY TRNSV 106X77 (MISCELLANEOUS) ×3 IMPLANT
DRAPE SHEET LG 3/4 BI-LAMINATE (DRAPES) ×3 IMPLANT
DRSG GAUZE FLUFF 36X18 (GAUZE/BANDAGES/DRESSINGS) ×2 IMPLANT
ELECT CAUTERY BLADE TIP 2.5 (TIP) ×3
ELECT CAUTERY NDL 2.0 MIC (NEEDLE) ×1 IMPLANT
ELECT CAUTERY NEEDLE 2.0 MIC (NEEDLE) IMPLANT
ELECT REM PT RETURN 9FT ADLT (ELECTROSURGICAL) ×3
ELECTRODE CAUTERY BLDE TIP 2.5 (TIP) ×1 IMPLANT
ELECTRODE REM PT RTRN 9FT ADLT (ELECTROSURGICAL) ×1 IMPLANT
GAUZE SPONGE 4X4 12PLY STRL (GAUZE/BANDAGES/DRESSINGS) ×3 IMPLANT
GLOVE BIOGEL PI IND STRL 7.0 (GLOVE) ×1 IMPLANT
GLOVE BIOGEL PI INDICATOR 7.0 (GLOVE) ×10
GLOVE SURG SYN 7.0 (GLOVE) ×12 IMPLANT
GLOVE SURG SYN 7.0 PF PI (GLOVE) ×2 IMPLANT
GOWN STRL REUS W/ TWL LRG LVL3 (GOWN DISPOSABLE) ×3 IMPLANT
GOWN STRL REUS W/TWL LRG LVL3 (GOWN DISPOSABLE) ×10
JACKSON PRATT 10 (INSTRUMENTS) IMPLANT
KIT TURNOVER KIT A (KITS) ×3 IMPLANT
LABEL OR SOLS (LABEL) ×3 IMPLANT
LIGHT WAVEGUIDE WIDE FLAT (MISCELLANEOUS) ×3 IMPLANT
NDL HYPO 25X1 1.5 SAFETY (NEEDLE) ×2 IMPLANT
NEEDLE HYPO 22GX1.5 SAFETY (NEEDLE) ×2 IMPLANT
NEEDLE HYPO 25X1 1.5 SAFETY (NEEDLE) ×3 IMPLANT
PACK BASIN MINOR ARMC (MISCELLANEOUS) ×3 IMPLANT
SLEVE PROBE SENORX GAMMA FIND (MISCELLANEOUS) ×3 IMPLANT
SPONGE LAP 18X18 RF (DISPOSABLE) ×4 IMPLANT
STOCKINETTE STRL 6IN 960660 (GAUZE/BANDAGES/DRESSINGS) ×1 IMPLANT
SUT ETHILON 3-0 FS-10 30 BLK (SUTURE) ×3
SUT MNCRL 4-0 (SUTURE) ×2
SUT MNCRL 4-0 27XMFL (SUTURE) ×1
SUT SILK 2 0 (SUTURE) ×2
SUT SILK 2 0 SH (SUTURE) ×3 IMPLANT
SUT SILK 2-0 30XBRD TIE 12 (SUTURE) IMPLANT
SUT SILK 3 0 12 30 (SUTURE) ×3 IMPLANT
SUT VIC AB 3-0 SH 27 (SUTURE) ×6
SUT VIC AB 3-0 SH 27X BRD (SUTURE) ×2 IMPLANT
SUTURE EHLN 3-0 FS-10 30 BLK (SUTURE) IMPLANT
SUTURE MNCRL 4-0 27XMF (SUTURE) ×2 IMPLANT
SYR 10ML LL (SYRINGE) ×3 IMPLANT
SYR 20CC LL (SYRINGE) ×2 IMPLANT
SYR 50ML LL SCALE MARK (SYRINGE) IMPLANT
WATER STERILE IRR 1000ML POUR (IV SOLUTION) ×3 IMPLANT

## 2019-01-17 NOTE — Anesthesia Postprocedure Evaluation (Signed)
Anesthesia Post Note  Patient: Jennifer Maddox  Procedure(s) Performed: TOTAL MASTECTOMY (Left ) SENTINEL NODE INJECTION (Left )  Patient location during evaluation: PACU Anesthesia Type: General Level of consciousness: awake and alert and oriented Pain management: pain level controlled Vital Signs Assessment: post-procedure vital signs reviewed and stable Respiratory status: spontaneous breathing, nonlabored ventilation and respiratory function stable Cardiovascular status: blood pressure returned to baseline and stable Postop Assessment: no signs of nausea or vomiting Anesthetic complications: no     Last Vitals:  Vitals:   01/17/19 1430 01/17/19 1445  BP: (!) 169/76 (!) 172/81  Pulse: 96 97  Resp: 16 14  Temp:    SpO2: 95% 96%    Last Pain:  Vitals:   01/17/19 1402  PainSc: 2                  Martel Galvan

## 2019-01-17 NOTE — Op Note (Addendum)
Preoperative diagnosis: left Breast Cancer.  Postoperative diagnosis: same.   Procedure: left simple mastectomy. SLNB  Anesthesia: GETA  Surgeon: Dr. Lysle Pearl Assistant:  Caroleen Hamman,  Present for expertise and to facilitate exposure   Wound Classification: Clean  Specimen: left Breast, SLN x3  Complications: None  Estimated Blood Loss: 25 mL   Indications: Patient is a 73 y.o. female who had an abnormal mammogram that on workup with core needle biopsy was found to be recurrent breast CA. After discussion of alternatives, the patient elected (simple) mastectomy.  Findings: 1. SLN x3 negative for mets 2. Adequate hemostasis post removal of breast tissue  Description of procedure: The patient was brought to the operating room and general anesthesia was induced. A time-out was completed verifying correct patient, procedure, site, positioning, and implant(s) and/or special equipment prior to beginning this procedure. The breast, chest wall, axilla, and upper arm and neck were prepped and draped in the usual sterile fashion.  A skin incision was made that encompassed the nipple-areola complex and the previous biopsy scar and passed in an oblique direction across the breast.  Hand-held gamma probe was used to identify the location of the hottest spot in the axilla, thorough the lateral edge of the original incision.  Sharp and blunt Dissection was carried down to subdermal facias. The probe was placed within wound and again, the point of maximal count was found. Dissection continue until nodule was identified. The probe was placed in contact with the node and 7000 counts were recorded. The node was excised in its entirety. Ex vivo, the node measured 6500 counts when placed on the probe. The bed of the node measured 100 counts. Two additional hot spot was detected and the node was excised in similar fashion. No additional hot spots were identified. No clinically abnormal nodes were palpated.  These  were sent via frozen section and pathology noted to be negative for macrometastases.  Skin flaps were raised in the avascular plane between subcutaneous tissue and breast tissue from the clavicle superiorly, the sternum medially, the anterior rectus sheath inferiorly, and past the lateral border of the pectoralis major muscle laterally. Hemostasis was achieved in the flaps. Next, the breast tissue and underlying pectoralis fascia were excised from the pectoralis major muscle, progressing from medially to laterally. At the lateral border of the pectoralis major muscle, the breast tissue was swung laterally and a lateral pedicle identified where breast tissue gave way to fat of axilla. The lateral pedicle was incised and the specimen removed.   Specimen marked long lateral, short superior and sent to pathology. The wound was irrigated and hemostasis was achieved. Closed suction drains were brought into the operative field through a separate stab incision and sutured to the skin with a 3-0 nylon suture. The wound was closed with interrupted 3-0 Vicryl to the subcutaneous layer, followed by a subcuticular layer of Monocryl 4-0. The wound was dressed.  The patient tolerated the procedure well and was taken to the postanesthesia care unit in stable condition.

## 2019-01-17 NOTE — Anesthesia Procedure Notes (Signed)
Procedure Name: Intubation Date/Time: 01/17/2019 10:02 AM Performed by: Rudean Hitt, CRNA Pre-anesthesia Checklist: Patient identified, Patient being monitored, Timeout performed, Emergency Drugs available and Suction available Patient Re-evaluated:Patient Re-evaluated prior to induction Oxygen Delivery Method: Circle system utilized Preoxygenation: Pre-oxygenation with 100% oxygen Induction Type: IV induction Ventilation: Mask ventilation without difficulty Laryngoscope Size: Mac and 3 Grade View: Grade I Tube type: Oral Tube size: 7.0 mm Number of attempts: 1 Airway Equipment and Method: Stylet Placement Confirmation: ETT inserted through vocal cords under direct vision,  positive ETCO2 and breath sounds checked- equal and bilateral Secured at: 21 cm Tube secured with: Tape Dental Injury: Teeth and Oropharynx as per pre-operative assessment

## 2019-01-17 NOTE — Transfer of Care (Addendum)
Immediate Anesthesia Transfer of Care Note  Patient: Jennifer Maddox  Procedure(s) Performed: TOTAL MASTECTOMY (Left ) SENTINEL NODE INJECTION (Left )  Patient Location: PACU  Anesthesia Type:General  Level of Consciousness: drowsy  Airway & Oxygen Therapy: Patient Spontanous Breathing and Patient connected to face mask oxygen  Post-op Assessment: Report given to RN and Post -op Vital signs reviewed and stable  Post vital signs: Reviewed and stable  Last Vitals:  Vitals Value Taken Time  BP 151/88 01/17/2019 12:46 PM  Temp 37.1 C 01/17/2019 12:40 PM  Pulse 106 01/17/2019 12:49 PM  Resp 19 01/17/2019 12:49 PM  SpO2 100 % 01/17/2019 12:49 PM  Vitals shown include unvalidated device data.  Last Pain:  Vitals:   01/17/19 1240  PainSc: Asleep         Complications: No apparent anesthesia complications

## 2019-01-17 NOTE — Interval H&P Note (Signed)
History and Physical Interval Note:  01/17/2019 8:22 AM  [name]  has presented today for surgery, with the diagnosis of MALIGNANT NEOPLASM UPPER INNER QUADRANT OF LEFT FEMALE BREAST ESTROGEN RECEPTOR POSITVE  The various methods of treatment have been discussed with the patient and family. After consideration of risks, benefits and other options for treatment, the patient has consented to  Procedure(s): TOTAL MASTECTOMY (Left) SENTINEL NODE INJECTION (Left) as a surgical intervention .  The patient's history has been reviewed, patient examined, no change in status, stable for surgery.  I have reviewed the patient's chart and labs.  Questions were answered to the patient's satisfaction.    Pt confirmed she is not requesting plastic reconstruction.  She understands we will be proceeding with SLNB, but there is a small chance this maybe needed to proceed with ALND, if SLN cannot be found.  She understands the r/b/a to ALND and still agreeable to procedure.  Will stay overnight for observation.  Jennifer Maddox

## 2019-01-17 NOTE — Anesthesia Post-op Follow-up Note (Signed)
Anesthesia QCDR form completed.        

## 2019-01-17 NOTE — Anesthesia Procedure Notes (Signed)
Anesthesia Regional Block: Pectoralis block   Pre-Anesthetic Checklist: ,, timeout performed, Correct Patient, Correct Site, Correct Laterality, Correct Procedure, Correct Position, site marked, Risks and benefits discussed,  Surgical consent,  Pre-op evaluation,  At surgeon's request and post-op pain management  Laterality: Left  Prep: chloraprep       Needles:  Injection technique: Single-shot  Needle Type: Stimiplex     Needle Length: 10cm  Needle Gauge: 21     Additional Needles:   Procedures:,,,, ultrasound used (permanent image in chart),,,,  Narrative:  Start time: 01/17/2019 9:26 AM End time: 01/17/2019 9:30 AM Injection made incrementally with aspirations every 5 mL.  Performed by: Personally  Anesthesiologist: Emmie Niemann, MD  Additional Notes: Functioning IV was confirmed and monitors were applied.  A Stimuplex needle was used. Sterile prep and drape,hand hygiene and sterile gloves were used.  Negative aspiration and negative test dose prior to incremental administration of local anesthetic. The patient tolerated the procedure well.

## 2019-01-17 NOTE — Anesthesia Preprocedure Evaluation (Signed)
Anesthesia Evaluation  Patient identified by MRN, date of birth, ID band Patient awake    Reviewed: Allergy & Precautions, NPO status , Patient's Chart, lab work & pertinent test results  History of Anesthesia Complications Negative for: history of anesthetic complications  Airway Mallampati: II  TM Distance: >3 FB Neck ROM: Full    Dental  (+) Upper Dentures, Lower Dentures   Pulmonary sleep apnea , COPD,  COPD inhaler,    breath sounds clear to auscultation- rhonchi (-) wheezing      Cardiovascular hypertension, Pt. on medications (-) CAD, (-) Past MI, (-) Cardiac Stents and (-) CABG  Rhythm:Regular Rate:Normal - Systolic murmurs and - Diastolic murmurs    Neuro/Psych TIAnegative psych ROS   GI/Hepatic negative GI ROS, Neg liver ROS,   Endo/Other  diabetes, Oral Hypoglycemic Agents  Renal/GU negative Renal ROS     Musculoskeletal  (+) Arthritis ,   Abdominal (+) + obese,   Peds  Hematology  (+) anemia ,   Anesthesia Other Findings Past Medical History: No date: Anemia No date: Arthritis 2011: Breast cancer ([place])     Comment:  left breast 2011: Breast cancer, left breast ([facility]) No date: COPD (chronic obstructive pulmonary disease) (HCC) No date: Degenerative disc disease, lumbar No date: Diabetes mellitus without complication (HCC) No date: Diverticulosis No date: Hyperlipidemia No date: Hypertension No date: Personal history of radiation therapy No date: Sleep apnea No date: TIA (transient ischemic attack)   Reproductive/Obstetrics                             Anesthesia Physical Anesthesia Plan  ASA: III  Anesthesia Plan: General   Post-op Pain Management:  Regional for Post-op pain   Induction: Intravenous  PONV Risk Score and Plan: 2 and Ondansetron, Dexamethasone and Midazolam  Airway Management Planned: LMA  Additional Equipment:   Intra-op Plan:    Post-operative Plan:   Informed Consent: I have reviewed the patients History and Physical, chart, labs and discussed the procedure including the risks, benefits and alternatives for the proposed anesthesia with the patient or authorized representative who has indicated his/her understanding and acceptance.     Dental advisory given  Plan Discussed with: CRNA and Anesthesiologist  Anesthesia Plan Comments:         Anesthesia Quick Evaluation

## 2019-01-18 ENCOUNTER — Encounter: Payer: Self-pay | Admitting: Surgery

## 2019-01-18 DIAGNOSIS — C50212 Malignant neoplasm of upper-inner quadrant of left female breast: Secondary | ICD-10-CM | POA: Diagnosis not present

## 2019-01-18 LAB — BASIC METABOLIC PANEL
Anion gap: 7 (ref 5–15)
BUN: 12 mg/dL (ref 8–23)
CHLORIDE: 105 mmol/L (ref 98–111)
CO2: 29 mmol/L (ref 22–32)
Calcium: 8.5 mg/dL — ABNORMAL LOW (ref 8.9–10.3)
Creatinine, Ser: 0.76 mg/dL (ref 0.44–1.00)
GFR calc Af Amer: >60 mL/min (ref >60)
GFR calc non Af Amer: >60 mL/min (ref >60)
Glucose, Bld: 126 mg/dL — ABNORMAL HIGH (ref 70–99)
POTASSIUM: 3.5 mmol/L (ref 3.5–5.1)
Sodium: 141 mmol/L (ref 135–145)

## 2019-01-18 LAB — CBC
HCT: 37.9 % (ref 36.0–46.0)
Hemoglobin: 12.1 g/dL (ref 12.0–15.0)
MCH: 29.2 pg (ref 26.0–34.0)
MCHC: 31.9 g/dL (ref 30.0–36.0)
MCV: 91.5 fL (ref 80.0–100.0)
Platelets: 221 10*3/uL (ref 150–400)
RBC: 4.14 MIL/uL (ref 3.87–5.11)
RDW: 13.5 % (ref 11.5–15.5)
WBC: 7.2 10*3/uL (ref 4.0–10.5)
nRBC: 0 % (ref 0.0–0.2)

## 2019-01-18 MED ORDER — IBUPROFEN 800 MG PO TABS: 800.0000 mg | ORAL_TABLET | Freq: Three times a day (TID) | ORAL | 0 refills | Status: DC | PRN

## 2019-01-18 MED ORDER — DOCUSATE SODIUM 100 MG PO CAPS: 100.0000 mg | ORAL_CAPSULE | Freq: Two times a day (BID) | ORAL | 0 refills | Status: AC | PRN

## 2019-01-18 MED ORDER — HYDROCODONE-ACETAMINOPHEN 5-325 MG PO TABS: 1.0000 | ORAL_TABLET | Freq: Four times a day (QID) | ORAL | 0 refills | Status: AC | PRN

## 2019-01-18 MED ORDER — ACETAMINOPHEN 325 MG PO TABS: 650.0000 mg | ORAL_TABLET | Freq: Three times a day (TID) | ORAL | 0 refills | Status: AC | PRN

## 2019-01-18 NOTE — Discharge Planning (Signed)
Patient IV removed.  RN assessment and VS revealed stability for DC to home.  Being DC with JP drain in place, per orders and will be re-assessed at FU appt. Discharge papers given, explained and educated. Discussed drain care.  Pain under control. Informed of suggested FU appt and appt made. Scripts sent to [facility] [address] [place], [place] (per patient request). Once ready, will be wheeled to front and family transporting home via car.

## 2019-01-18 NOTE — Care Management Obs Status (Signed)
[facility] NOTIFICATION   Patient Details  Name: Jennifer Maddox MRN: [id number] Date of Birth: [birth year]   Medicare Observation Status Notification Given:  No(admitted obs less thand 24 hours)    Beverly Sessions, RN 01/18/2019, 9:56 AM

## 2019-01-18 NOTE — Discharge Summary (Signed)
Physician Discharge Summary  Patient ID: Jennifer Maddox MRN: [id number] DOB/AGE: [birth year] 75 y.o.  Admit date: 01/17/2019 Discharge date: 01/18/2019  Admission Diagnoses: recurrent left breast CA  Discharge Diagnoses:  Same as above  Discharged Condition: good  Hospital Course: elective left mastectomy for above.  See op note for details.  Admitted overnight for obs and pain control.  Drain output and level of pain as expected in am, tolerating diet and comfortable going home so will be d/c  Consults: None  Discharge Exam: Blood pressure 118/62, pulse 74, temperature 98 F (36.7 C), temperature source Oral, resp. rate 18, height '5\' 5"'  (1.651 m), weight 87.6 kg, SpO2 96 %. General appearance: alert, cooperative and no distress Chest wall: appropriate tenderness around incision with serosanguinous discharge in JP 146m overnight Skin: Skin color, texture, turgor normal. No rashes or lesions Incision/Wound: c/d/i  Disposition:  Discharge disposition: 01-Home or Self Care       Discharge Instructions    Discharge patient   Complete by:  As directed    Discharge disposition:  01-Home or Self Care   Discharge patient date:  01/18/2019     Allergies as of 01/18/2019      Reactions   Atorvastatin Other (See Comments)   Cramps   Naproxen Sodium Other (See Comments)   Head and Neck Puritis      Medication List    TAKE these medications   acetaminophen 325 MG tablet Commonly known as:  TYLENOL Take 2 tablets (650 mg total) by mouth every 8 (eight) hours as needed for up to 30 days for mild pain.   acidophilus Caps capsule Take 1 capsule by mouth daily.   [facility] PROBIOTIC Chew Chew 500 mg by mouth daily at 6 (six) AM.   ANTI-DIARRHEAL 2 MG tablet Generic drug:  loperamide Take 2 mg elemental calcium/kg/hr by mouth daily as needed (diarrhea).   aspirin EC 81 MG tablet Take 81 mg by mouth daily.   BLINK TEARS OP Apply 1 drop to eye daily as needed (dry eyes).   blood  glucose meter kit and supplies USE TWICE DAILY   clobetasol ointment 0.05 % Commonly known as:  TEMOVATE Apply 1 application topically daily as needed (itching).   docusate sodium 100 MG capsule Commonly known as:  COLACE Take 1 capsule (100 mg total) by mouth 2 (two) times daily as needed for up to 10 days for mild constipation.   furosemide 20 MG tablet Commonly known as:  LASIX Take 20 mg by mouth 2 (two) times daily.   gabapentin 300 MG capsule Commonly known as:  NEURONTIN Take 300 mg by mouth at bedtime.   glucose blood test strip   HYDROcodone-acetaminophen 5-325 MG tablet Commonly known as:  NORCO Take 1 tablet by mouth every 6 (six) hours as needed for up to 7 days for moderate pain.   ibuprofen 800 MG tablet Commonly known as:  ADVIL,MOTRIN Take 1 tablet (800 mg total) by mouth every 8 (eight) hours as needed for mild pain or moderate pain. What changed:    medication strength  how much to take  when to take this  reasons to take this   losartan 25 MG tablet Commonly known as:  COZAAR Take 50 mg by mouth daily.   MELATONIN PO Take 12 mg by mouth at bedtime.   metFORMIN 1000 MG tablet Commonly known as:  GLUCOPHAGE Take 1,000 mg by mouth 2 (two) times daily with a meal.   MULTIVITAMIN ADULT PO Take  1 tablet by mouth daily.   pravastatin 40 MG tablet Commonly known as:  PRAVACHOL Take 40 mg by mouth daily.   traZODone 100 MG tablet Commonly known as:  DESYREL Take 150 mg by mouth at bedtime.   vitamin B-12 1000 MCG tablet Commonly known as:  CYANOCOBALAMIN Take 2,000 mcg by mouth daily.      Follow-up Information    Tana Trefry, DO Follow up in 1 week(s).   Specialty:  Surgery Why:  for wound check and possible drain removal Contact information: [address] [place] [address] [phone]            Total time spent arranging discharge was >53mn. Signed: IBenjamine Sprague2/04/2019, 7:41 AM

## 2019-01-18 NOTE — Discharge Instructions (Signed)
BREAST SURGERY, Care After This sheet gives you information about how to care for yourself after your procedure. Your doctor may also give you more specific instructions. If you have problems or questions, contact your doctor. Follow these instructions at home: Care for cuts from surgery (incisions)     Follow instructions from your doctor about how to take care of your cuts from surgery. Make sure you: ? Wash your hands with soap and water before you change your bandage (dressing). If you cannot use soap and water, use hand sanitizer. ? Change your bandage as told by your doctor. ? Leave stitches (sutures), skin glue, or skin tape (adhesive) strips in place. They may need to stay in place for 2 weeks or longer. If tape strips get loose and curl up, you may trim the loose edges. Do not remove tape strips completely unless your doctor says it is okay.  Do not take baths, swim, or use a hot tub until your doctor says it is okay. OK TO SHOWER IN 24HRS FROM DATE OF SURGERY.   Monitor drain output as discussed with your nurse prior to discharge.  CALL OFFICE IF OUTPUT IS GREATER THAN 229ml over 24hrs.  Check your surgical cut area every day for signs of infection. Check for: ? More redness, swelling, or pain. ? More fluid or blood. ? Warmth. ? Pus or a bad smell. Activity  Do not drive or use heavy machinery while taking prescription pain medicine.  Do not play contact sports until your doctor says it is okay.  Do not drive for 24 hours if you were given a medicine to help you relax (sedative).  Rest as needed. Do not return to work or school until your doctor says it is okay.  Wear provided bra as tolerated to minimize discomfort General instructions   tylenol and advil as needed for discomfort.  Please alternate between the two every four hours as needed for pain.     Use narcotics, if prescribed, only when tylenol and motrin is not enough to control pain.   325-650mg  every 8hrs to  max of 4000mg /24hrs (including the 325mg  in every norco dose) for the tylenol.     Advil up to 800mg  per dose every 8hrs as needed for pain.    To prevent or treat constipation while you are taking prescription pain medicine, your doctor may recommend that you: ? Drink enough fluid to keep your pee (urine) clear or pale yellow. ? Take over-the-counter or prescription medicines. ? Eat foods that are high in fiber, such as fresh fruits and vegetables, whole grains, and beans. ? Limit foods that are high in fat and processed sugars, such as fried and sweet foods. Contact a doctor if:  You develop a rash.  You have more redness, swelling, or pain around your surgical cuts.  You have more fluid or blood coming from your surgical cuts.  Your surgical cuts feel warm to the touch.  You have pus or a bad smell coming from your surgical cuts.  You have a fever.  One or more of your surgical cuts breaks open. Get help right away if:  You have trouble breathing.  You have chest pain.  You have pain that is getting worse in your shoulders.  You faint or feel dizzy when you stand.  You have very bad pain in your belly (abdomen).  You are sick to your stomach (nauseous) for more than one day.  You have throwing up (vomiting) that lasts  for more than one day.  You have leg pain. This information is not intended to replace advice given to you by your health care provider. Make sure you discuss any questions you have with your health care provider.

## 2019-01-19 ENCOUNTER — Telehealth: Payer: Self-pay | Admitting: *Deleted

## 2019-01-19 NOTE — Telephone Encounter (Signed)
Called patient to check on her and let her know that Dr. Janese Banks would like to see her next Tuesday.  Says she is doing okay if she is agreeable to appointment next Tuesday at 3 PM.she has questions about her surgical wound care and I tried to look up d/c instructions and could not find them. The patient found her instructions and it said that she should change her dressing per md directions. I gave her the number to dr. Lysle Pearl office to call since I could not help her.

## 2019-01-20 LAB — SURGICAL PATHOLOGY

## 2019-01-24 ENCOUNTER — Inpatient Hospital Stay: Payer: Medicare HMO | Attending: Oncology | Admitting: Oncology

## 2019-01-24 DIAGNOSIS — Z8673 Personal history of transient ischemic attack (TIA), and cerebral infarction without residual deficits: Secondary | ICD-10-CM | POA: Insufficient documentation

## 2019-01-24 DIAGNOSIS — Z7982 Long term (current) use of aspirin: Secondary | ICD-10-CM | POA: Insufficient documentation

## 2019-01-24 DIAGNOSIS — C50912 Malignant neoplasm of unspecified site of left female breast: Secondary | ICD-10-CM | POA: Insufficient documentation

## 2019-01-24 DIAGNOSIS — Z780 Asymptomatic menopausal state: Secondary | ICD-10-CM | POA: Insufficient documentation

## 2019-01-24 DIAGNOSIS — Z7984 Long term (current) use of oral hypoglycemic drugs: Secondary | ICD-10-CM | POA: Insufficient documentation

## 2019-01-24 DIAGNOSIS — Z791 Long term (current) use of non-steroidal anti-inflammatories (NSAID): Secondary | ICD-10-CM | POA: Insufficient documentation

## 2019-01-24 DIAGNOSIS — E7800 Pure hypercholesterolemia, unspecified: Secondary | ICD-10-CM | POA: Insufficient documentation

## 2019-01-24 DIAGNOSIS — Z923 Personal history of irradiation: Secondary | ICD-10-CM | POA: Insufficient documentation

## 2019-01-24 DIAGNOSIS — Z9012 Acquired absence of left breast and nipple: Secondary | ICD-10-CM | POA: Insufficient documentation

## 2019-01-24 DIAGNOSIS — I10 Essential (primary) hypertension: Secondary | ICD-10-CM | POA: Insufficient documentation

## 2019-01-24 DIAGNOSIS — E119 Type 2 diabetes mellitus without complications: Secondary | ICD-10-CM | POA: Insufficient documentation

## 2019-01-24 DIAGNOSIS — Z170 Estrogen receptor positive status [ER+]: Secondary | ICD-10-CM | POA: Insufficient documentation

## 2019-01-24 DIAGNOSIS — Z87891 Personal history of nicotine dependence: Secondary | ICD-10-CM | POA: Insufficient documentation

## 2019-01-24 DIAGNOSIS — E785 Hyperlipidemia, unspecified: Secondary | ICD-10-CM | POA: Insufficient documentation

## 2019-01-24 DIAGNOSIS — Z79899 Other long term (current) drug therapy: Secondary | ICD-10-CM | POA: Insufficient documentation

## 2019-01-24 DIAGNOSIS — G4730 Sleep apnea, unspecified: Secondary | ICD-10-CM | POA: Insufficient documentation

## 2019-01-25 NOTE — Progress Notes (Signed)
Phoned patient to question missed appointment with Dr. Janese Banks yesterday.  States she was unaware of appointment, although she had received confirmation from [facility] and Festus Holts.  Per [name], Dr Janese Banks has looked at surgical pathology and is sending tissue for Oncotype.  Patient will be scheduled  For follow-up and will receive Oncotype results at that visit.  Oncology Nurse Navigator Documentation  Navigator Location: [facility] (01/25/19 0900)   )Navigator Encounter Type: Telephone (01/25/19 0900) Telephone: [name] Call;Appt Confirmation/Clarification (01/25/19 0900)                   Patient Visit Type: Follow-up (01/25/19 0900)                              Time Spent with Patient: 15 (01/25/19 0900)

## 2019-01-30 ENCOUNTER — Telehealth: Payer: Self-pay | Admitting: *Deleted

## 2019-01-30 NOTE — Telephone Encounter (Signed)
Called patient about an appointment for her after she gets the Oncotype done.  Requested the test  February 12.  Told her we would like to see her on March 2 at 10 AM.  Time we are hopeful that we will have the results back by then.  She is agreeable to the appointment and she says she wrote it down and I printed off a copy start compounded in the mail for her also as a reminder.

## 2019-02-02 ENCOUNTER — Encounter: Payer: Self-pay | Admitting: Oncology

## 2019-02-07 ENCOUNTER — Encounter: Payer: Self-pay | Admitting: Oncology

## 2019-02-07 ENCOUNTER — Inpatient Hospital Stay (HOSPITAL_BASED_OUTPATIENT_CLINIC_OR_DEPARTMENT_OTHER): Payer: Medicare HMO | Admitting: Oncology

## 2019-02-07 VITALS — BP 144/79 | HR 58 | Temp 98.1°F | Resp 18 | Ht 65.0 in | Wt 196.0 lb

## 2019-02-07 DIAGNOSIS — I10 Essential (primary) hypertension: Secondary | ICD-10-CM

## 2019-02-07 DIAGNOSIS — Z79899 Other long term (current) drug therapy: Secondary | ICD-10-CM

## 2019-02-07 DIAGNOSIS — E7800 Pure hypercholesterolemia, unspecified: Secondary | ICD-10-CM

## 2019-02-07 DIAGNOSIS — C50912 Malignant neoplasm of unspecified site of left female breast: Secondary | ICD-10-CM

## 2019-02-07 DIAGNOSIS — Z7189 Other specified counseling: Secondary | ICD-10-CM

## 2019-02-07 DIAGNOSIS — Z9012 Acquired absence of left breast and nipple: Secondary | ICD-10-CM

## 2019-02-07 DIAGNOSIS — Z170 Estrogen receptor positive status [ER+]: Secondary | ICD-10-CM | POA: Diagnosis not present

## 2019-02-07 DIAGNOSIS — E785 Hyperlipidemia, unspecified: Secondary | ICD-10-CM

## 2019-02-07 DIAGNOSIS — Z7982 Long term (current) use of aspirin: Secondary | ICD-10-CM | POA: Diagnosis not present

## 2019-02-07 DIAGNOSIS — Z87891 Personal history of nicotine dependence: Secondary | ICD-10-CM | POA: Diagnosis not present

## 2019-02-07 DIAGNOSIS — G4730 Sleep apnea, unspecified: Secondary | ICD-10-CM | POA: Diagnosis not present

## 2019-02-07 DIAGNOSIS — Z923 Personal history of irradiation: Secondary | ICD-10-CM | POA: Diagnosis not present

## 2019-02-07 DIAGNOSIS — Z791 Long term (current) use of non-steroidal anti-inflammatories (NSAID): Secondary | ICD-10-CM | POA: Diagnosis not present

## 2019-02-07 DIAGNOSIS — Z7984 Long term (current) use of oral hypoglycemic drugs: Secondary | ICD-10-CM | POA: Diagnosis not present

## 2019-02-07 DIAGNOSIS — Z780 Asymptomatic menopausal state: Secondary | ICD-10-CM | POA: Diagnosis not present

## 2019-02-07 DIAGNOSIS — E119 Type 2 diabetes mellitus without complications: Secondary | ICD-10-CM

## 2019-02-07 DIAGNOSIS — Z8673 Personal history of transient ischemic attack (TIA), and cerebral infarction without residual deficits: Secondary | ICD-10-CM

## 2019-02-07 NOTE — Progress Notes (Signed)
Pt here to get oncotype resutls, breast pain from surgery but getting better and takes tylenol and ibuprofen.

## 2019-02-10 NOTE — Progress Notes (Signed)
Hematology/Oncology Consult note [facility]  Telephone:([phone] [phone]  Patient Care Team: [place] Pink, MD as PCP - General (Family Medicine)   Name of the patient: Jennifer Maddox  [id number]  1946/04/23   Date of visit: 02/10/19  Diagnosis-invasive mammary carcinoma of the left breast pathological prognostic stage IA pT1 ppN0 cM0 ER PR positive and HER-2/neu negative status post mastectomy  Chief complaint/ Reason for visit-discuss Oncotype testing results and further management  Heme/Onc history: patient is a 73 year old female who was diagnosed with stage I breast cancer in 2011.  It was a T1b N0 M0 stage I tumor s/p lumpectomy.  Oncotype DX score was 7 and she did not require adjuvant chemotherapy.  She did complete adjuvant radiation and 5 years of hormone therapy.  She last saw me in November 2018 when she was referred to me for a low IgA level which was nonspecific.  She was continuing to get screening mammograms with her primary care doctor.  Recent mammogram and ultrasound on 11/24/2018 showed irregular hypoechoic mass in the left breast measuring 5 x 4 x 4 mm.  No enlarged or morphologically abnormal lymph nodes in the left axilla.  Patient underwent biopsy which showed invasive mammary carcinoma, grade 2, ER greater than 90% positive, PR greater than 90% positive.  HER-2 was equivocal by IHC.  FISH testing negative  Patient underwent left mastectomy on 12/01/2018.  Final pathology showed 6 mm, grade 2 invasive mammary carcinoma with negative margins.  ER PR positive and HER-2/neu negative.  3 sentinel and one non-sentinel lymph node was negative for malignancy.  Oncotype testing came back with intermediate risk score of 18 and given her age patient does not require any adjuvant chemotherapy  interval history-she is healing well since her mastectomy.  Still has some soreness at the site but denies other complaints  ECOG PS- 1 Pain scale-  2 Opioid associated constipation- no  Review of systems- Review of Systems  Constitutional: Negative for chills, fever, malaise/fatigue and weight loss.  HENT: Negative for congestion, ear discharge and nosebleeds.   Eyes: Negative for blurred vision.  Respiratory: Negative for cough, hemoptysis, sputum production, shortness of breath and wheezing.   Cardiovascular: Negative for chest pain, palpitations, orthopnea and claudication.  Gastrointestinal: Negative for abdominal pain, blood in stool, constipation, diarrhea, heartburn, melena, nausea and vomiting.  Genitourinary: Negative for dysuria, flank pain, frequency, hematuria and urgency.  Musculoskeletal: Negative for back pain, joint pain and myalgias.  Skin: Negative for rash.  Neurological: Negative for dizziness, tingling, focal weakness, seizures, weakness and headaches.  Endo/Heme/Allergies: Does not bruise/bleed easily.  Psychiatric/Behavioral: Negative for depression and suicidal ideas. The patient does not have insomnia.        Allergies  Allergen Reactions  . Atorvastatin Other (See Comments)    Cramps  . Naproxen Sodium Other (See Comments)    Head and Neck Puritis     Past Medical History:  Diagnosis Date  . Anemia   . Arthritis   . Breast cancer ([place]) 2011   left breast  . Breast cancer, left breast ([place]) 2011  . COPD (chronic obstructive pulmonary disease) ([place])   . Degenerative disc disease, lumbar   . Diabetes mellitus without complication ([place])   . Diverticulosis   . Hyperlipidemia   . Hypertension   . Personal history of radiation therapy   . Sleep apnea   . TIA (transient ischemic attack)      Past Surgical History:  Procedure Laterality  Date  . ABDOMINAL HYSTERECTOMY     around age 74 ; ovaries intact  . BREAST BIOPSY Left 12/01/2018   Pathology LEFT breast mass 11 o'clock location: Invasive mammary  . BREAST EXCISIONAL BIOPSY Left 2011   breast ca with radation  . CHOLECYSTECTOMY    .  COLONOSCOPY W/ POLYPECTOMY    . COLONOSCOPY WITH PROPOFOL N/A 10/29/2017   Procedure: COLONOSCOPY WITH PROPOFOL;  Surgeon: Manya Silvas, MD;  Location: [facility] ENDOSCOPY;  Service: Endoscopy;  Laterality: N/A;  . ESOPHAGOGASTRODUODENOSCOPY (EGD) WITH PROPOFOL N/A 10/29/2017   Procedure: ESOPHAGOGASTRODUODENOSCOPY (EGD) WITH PROPOFOL;  Surgeon: Manya Silvas, MD;  Location: [facility] ENDOSCOPY;  Service: Endoscopy;  Laterality: N/A;  . KNEE ARTHROSCOPY W/ MENISCAL REPAIR Right   . SENTINEL NODE BIOPSY Left 01/17/2019   Procedure: SENTINEL NODE INJECTION;  Surgeon: Benjamine Sprague, DO;  Location: [facility] ORS;  Service: General;  Laterality: Left;  . TONSILLECTOMY    . TOTAL MASTECTOMY Left 01/17/2019   Procedure: TOTAL MASTECTOMY;  Surgeon: Benjamine Sprague, DO;  Location: [facility] ORS;  Service: General;  Laterality: Left;    Social History   Socioeconomic History  . Marital status: Married    Spouse name: Not on file  . Number of children: Not on file  . Years of education: Not on file  . Highest education level: Not on file  Occupational History  . Not on file  Social Needs  . Financial resource strain: Not on file  . Food insecurity:    Worry: Not on file    Inability: Not on file  . Transportation needs:    Medical: Not on file    Non-medical: Not on file  Tobacco Use  . Smoking status: Former Smoker    Last attempt to quit: 12/14/1977    Years since quitting: 41.1  . Smokeless tobacco: Never Used  Substance and Sexual Activity  . Alcohol use: No  . Drug use: No  . Sexual activity: Not on file  Lifestyle  . Physical activity:    Days per week: Not on file    Minutes per session: Not on file  . Stress: Not on file  Relationships  . Social connections:    Talks on phone: Not on file    Gets together: Not on file    Attends religious service: Not on file    Active member of club or organization: Not on file    Attends meetings of clubs or organizations: Not on file    Relationship  status: Not on file  . Intimate partner violence:    Fear of current or ex partner: Not on file    Emotionally abused: Not on file    Physically abused: Not on file    Forced sexual activity: Not on file  Other Topics Concern  . Not on file  Social History Narrative  . Not on file    Family History  Problem Relation Age of Onset  . Lung cancer Mother        smoker; deceased 51  . Heart attack Father        deceased 90  . Melanoma Brother 41       currently 53  . Prostate cancer Brother 38       currently 3  . Melanoma Son 45       below left armpit; currently 21     Current Outpatient Medications:  .  acetaminophen (TYLENOL) 325 MG tablet, Take 2 tablets (650 mg total) by mouth  every 8 (eight) hours as needed for up to 30 days for mild pain., Disp: 40 tablet, Rfl: 0 .  blood glucose meter kit and supplies, USE TWICE DAILY, Disp: , Rfl:  .  clobetasol ointment (TEMOVATE) 7.00 %, Apply 1 application topically daily as needed (itching). , Disp: , Rfl: 1 .  furosemide (LASIX) 20 MG tablet, Take 20 mg by mouth 2 (two) times daily. , Disp: , Rfl:  .  gabapentin (NEURONTIN) 300 MG capsule, Take 300 mg by mouth at bedtime. , Disp: , Rfl:  .  glucose blood test strip, , Disp: , Rfl:  .  ibuprofen (ADVIL,MOTRIN) 800 MG tablet, Take 1 tablet (800 mg total) by mouth every 8 (eight) hours as needed for mild pain or moderate pain., Disp: 30 tablet, Rfl: 0 .  loperamide (ANTI-DIARRHEAL) 2 MG tablet, Take 2 mg elemental calcium/kg/hr by mouth daily as needed (diarrhea). , Disp: , Rfl:  .  losartan (COZAAR) 25 MG tablet, Take 50 mg by mouth daily. , Disp: , Rfl:  .  MELATONIN PO, Take 12 mg by mouth at bedtime. , Disp: , Rfl:  .  Multiple Vitamins-Minerals (MULTIVITAMIN ADULT PO), Take 1 tablet by mouth daily. , Disp: , Rfl:  .  Polyethylene Glycol 400 (BLINK TEARS OP), Apply 1 drop to eye daily as needed (dry eyes)., Disp: , Rfl:  .  pravastatin (PRAVACHOL) 40 MG tablet, Take 40 mg by mouth  daily. , Disp: , Rfl:  .  Probiotic Product ([employer] PROBIOTIC) CHEW, Chew 500 mg by mouth daily at 6 (six) AM., Disp: , Rfl:  .  traZODone (DESYREL) 100 MG tablet, Take 150 mg by mouth at bedtime. , Disp: , Rfl:  .  vitamin B-12 (CYANOCOBALAMIN) 1000 MCG tablet, Take 2,000 mcg by mouth daily. , Disp: , Rfl:  .  aspirin EC 81 MG tablet, Take 81 mg by mouth daily., Disp: , Rfl:  .  metFORMIN (GLUCOPHAGE) 1000 MG tablet, Take 1,000 mg by mouth 2 (two) times daily with a meal. , Disp: , Rfl:   Physical exam:  Vitals:   02/07/19 1055  BP: (!) 144/79  Pulse: (!) 58  Resp: 18  Temp: 98.1 F (36.7 C)  TempSrc: Tympanic  Weight: 196 lb (88.9 kg)  Height: _0  (1.651 m)   Physical Exam HENT:     Head: Normocephalic and atraumatic.  Eyes:     Pupils: Pupils are equal, round, and reactive to light.  Neck:     Musculoskeletal: Normal range of motion.  Cardiovascular:     Rate and Rhythm: Normal rate and regular rhythm.     Heart sounds: Normal heart sounds.  Pulmonary:     Effort: Pulmonary effort is normal.     Breath sounds: Normal breath sounds.  Abdominal:     General: Bowel sounds are normal.     Palpations: Abdomen is soft.  Skin:    General: Skin is warm and dry.  Neurological:     Mental Status: She is alert and oriented to person, place, and time.   Patient is status post left mastectomy without reconstruction with a well-healed surgical scar.  No evidence of local infection  CMP Latest Ref Rng & Units 01/18/2019  Glucose 70 - 99 mg/dL 126(H)  BUN 8 - 23 mg/dL 12  Creatinine 0.44 - 1.00 mg/dL 0.76  Sodium 135 - 145 mmol/L 141  Potassium 3.5 - 5.1 mmol/L 3.5  Chloride 98 - 111 mmol/L 105  CO2 22 - 32  mmol/L 29  Calcium 8.9 - 10.3 mg/dL 8.5(L)  Total Protein 6.5 - 8.1 g/dL -  Total Bilirubin 0.3 - 1.2 mg/dL -  Alkaline Phos 38 - 126 U/L -  AST 15 - 41 U/L -  ALT 14 - 54 U/L -   CBC Latest Ref Rng & Units 01/18/2019  WBC 4.0 - 10.5 K/uL 7.2  Hemoglobin 12.0 - 15.0 g/dL  12.1  Hematocrit 36.0 - 46.0 % 37.9  Platelets 150 - 400 K/uL 221    No images are attached to the encounter.  Nm Sentinel Node Injection  Result Date: 01/17/2019 CLINICAL DATA:  Left breast cancer. EXAM: NUCLEAR MEDICINE BREAST LYMPHOSCINTIGRAPHY TECHNIQUE: Intradermal injection of radiopharmaceutical was performed at the 12 o'clock, 3 o'clock, 6 o'clock, and 9 o'clock positions around the left nipple. The patient was then sent to the operating room where the sentinel node(s) were identified and removed by the surgeon. RADIOPHARMACEUTICALS:  Total of 1 mCi Millipore-filtered Technetium-91msulfur colloid, injected in four aliquots of 0.25 mCi each. IMPRESSION: Uncomplicated intradermal injection of a total of 1 mCi Technetium-975mulfur colloid for purposes of sentinel node identification. Electronically Signed   By: MaInez Catalina.D.   On: 01/17/2019 09:01     Assessment and plan- Patient is a [age].o. female with newly diagnosed invasive mammary carcinoma of the left breast pathological prognostic stage Ia pT1b pN0 cM0 ER PR positive HER-2/neu negative status post left mastectomy here to discuss Oncotype testing results  Patient had to undergo mastectomy given that she had prior left breast cancer and underwent adjuvant radiation treatment.  I discussed the results of the pathology with the patient in detail which showed a 6 mm grade 2 invasive mammary carcinoma with negative margins and negative nodes.  Oncotype testing came back as intermediate score of 18.  Given that she is greater than [age] of age according to the results of the tailor X trial she would not benefit from adjuvant chemotherapy.  Given that her tumor was ER PR positive hormone therapy is indicated at this time.  Idiscussed the role for hormone therapy. Given that she is postmenopausal I would favor 5 years of adjuvant hormone therapy with aromatase inhibitor. I discussed the risks and benefits of Arimidex including all but  not limited to fatigue, hypercholesterolemia, hot flashes, arthralgias and worsening bone health.  Patient will also need to be on calcium 1200 mg along with vitamin D 800 international units.  We will obtain a baseline bone density scan written information about Arimidex given to the patient. Patient verbalized understanding and agrees to proceed.  Treatment will be given with a curative intent  I will prescribe Arimidex to her pharmacy and I will see her back in 6 weeks with a bone density scan prior    Visit Diagnosis 1. Malignant neoplasm of left female breast, unspecified estrogen receptor status, unspecified site of breast ([facility]  2. Postmenopausal      Dr. ArRanda EvensMD, MPH [id number]/28/2020 2:23 PM

## 2019-02-13 ENCOUNTER — Ambulatory Visit: Payer: Medicare HMO | Admitting: Oncology

## 2019-03-21 ENCOUNTER — Other Ambulatory Visit: Payer: Medicare HMO

## 2019-03-21 ENCOUNTER — Ambulatory Visit: Payer: Medicare HMO | Admitting: Oncology

## 2019-03-22 ENCOUNTER — Other Ambulatory Visit: Payer: Self-pay | Admitting: *Deleted

## 2019-03-22 ENCOUNTER — Telehealth: Payer: Self-pay | Admitting: *Deleted

## 2019-03-22 ENCOUNTER — Other Ambulatory Visit: Payer: Self-pay

## 2019-03-22 DIAGNOSIS — C50912 Malignant neoplasm of unspecified site of left female breast: Secondary | ICD-10-CM

## 2019-03-22 MED ORDER — ANASTROZOLE 1 MG PO TABS: 1.0000 mg | ORAL_TABLET | Freq: Every day | ORAL | 3 refills | Status: DC

## 2019-03-22 NOTE — Telephone Encounter (Signed)
The patient had said to [facility] on the phone that she needs to talk to someone about getting her med. I called her and she was not there so I left a message to call me and she called later today and said that she never got the medication. She said that she called and left messages but she does not know who's number she called. I asked her if she called me directly and she said no-she never had my number but I gave it to her today. I explained that we wanted her on the medication and then see her 6 weeks from then.,since she did not have medication to start we will need to move the appt.  She was fine with that. I told her that she will come in 5/20 and have labs and then will be 5/21 for webex- but she can only use her phone but it has video capability on it. Once appt is made then I will send it in the mail to her. She will pick up arimidex today and start it tom. She is agreeable to plan

## 2019-03-23 ENCOUNTER — Ambulatory Visit: Payer: Medicare HMO | Admitting: Oncology

## 2019-03-23 ENCOUNTER — Inpatient Hospital Stay: Payer: Medicare HMO

## 2019-03-27 ENCOUNTER — Inpatient Hospital Stay: Payer: Medicare HMO | Admitting: Oncology

## 2019-05-02 ENCOUNTER — Other Ambulatory Visit: Payer: Self-pay

## 2019-05-03 ENCOUNTER — Other Ambulatory Visit: Payer: Self-pay

## 2019-05-03 ENCOUNTER — Inpatient Hospital Stay: Payer: Medicare HMO | Attending: Oncology

## 2019-05-03 DIAGNOSIS — Z7982 Long term (current) use of aspirin: Secondary | ICD-10-CM | POA: Diagnosis not present

## 2019-05-03 DIAGNOSIS — C50912 Malignant neoplasm of unspecified site of left female breast: Secondary | ICD-10-CM

## 2019-05-03 DIAGNOSIS — Z170 Estrogen receptor positive status [ER+]: Secondary | ICD-10-CM | POA: Insufficient documentation

## 2019-05-03 DIAGNOSIS — Z791 Long term (current) use of non-steroidal anti-inflammatories (NSAID): Secondary | ICD-10-CM | POA: Diagnosis not present

## 2019-05-03 DIAGNOSIS — Z8249 Family history of ischemic heart disease and other diseases of the circulatory system: Secondary | ICD-10-CM | POA: Diagnosis not present

## 2019-05-03 DIAGNOSIS — I10 Essential (primary) hypertension: Secondary | ICD-10-CM | POA: Diagnosis not present

## 2019-05-03 DIAGNOSIS — E785 Hyperlipidemia, unspecified: Secondary | ICD-10-CM | POA: Diagnosis not present

## 2019-05-03 DIAGNOSIS — Z79899 Other long term (current) drug therapy: Secondary | ICD-10-CM | POA: Diagnosis not present

## 2019-05-03 DIAGNOSIS — Z79811 Long term (current) use of aromatase inhibitors: Secondary | ICD-10-CM | POA: Diagnosis not present

## 2019-05-03 DIAGNOSIS — C50212 Malignant neoplasm of upper-inner quadrant of left female breast: Secondary | ICD-10-CM | POA: Diagnosis present

## 2019-05-03 DIAGNOSIS — E119 Type 2 diabetes mellitus without complications: Secondary | ICD-10-CM | POA: Diagnosis not present

## 2019-05-03 DIAGNOSIS — Z7984 Long term (current) use of oral hypoglycemic drugs: Secondary | ICD-10-CM | POA: Diagnosis not present

## 2019-05-03 DIAGNOSIS — Z87891 Personal history of nicotine dependence: Secondary | ICD-10-CM | POA: Diagnosis not present

## 2019-05-03 DIAGNOSIS — Z8673 Personal history of transient ischemic attack (TIA), and cerebral infarction without residual deficits: Secondary | ICD-10-CM | POA: Diagnosis not present

## 2019-05-03 LAB — COMPREHENSIVE METABOLIC PANEL
ALT: 14 U/L (ref 0–44)
AST: 17 U/L (ref 15–41)
Albumin: 4.4 g/dL (ref 3.5–5.0)
Alkaline Phosphatase: 54 U/L (ref 38–126)
Anion gap: 10 (ref 5–15)
BUN: 13 mg/dL (ref 8–23)
CO2: 31 mmol/L (ref 22–32)
Calcium: 9.5 mg/dL (ref 8.9–10.3)
Chloride: 101 mmol/L (ref 98–111)
Creatinine, Ser: 0.88 mg/dL (ref 0.44–1.00)
GFR calc Af Amer: >60 mL/min (ref >60)
GFR calc non Af Amer: >60 mL/min (ref >60)
Glucose, Bld: 122 mg/dL — ABNORMAL HIGH (ref 70–99)
Potassium: 3.7 mmol/L (ref 3.5–5.1)
Sodium: 142 mmol/L (ref 135–145)
Total Bilirubin: 0.7 mg/dL (ref 0.3–1.2)
Total Protein: 6.9 g/dL (ref 6.5–8.1)

## 2019-05-04 ENCOUNTER — Encounter: Payer: Self-pay | Admitting: Oncology

## 2019-05-04 ENCOUNTER — Inpatient Hospital Stay (HOSPITAL_BASED_OUTPATIENT_CLINIC_OR_DEPARTMENT_OTHER): Payer: Medicare HMO | Admitting: Oncology

## 2019-05-04 DIAGNOSIS — C50212 Malignant neoplasm of upper-inner quadrant of left female breast: Secondary | ICD-10-CM

## 2019-05-04 DIAGNOSIS — Z87891 Personal history of nicotine dependence: Secondary | ICD-10-CM

## 2019-05-04 DIAGNOSIS — Z5181 Encounter for therapeutic drug level monitoring: Secondary | ICD-10-CM

## 2019-05-04 DIAGNOSIS — Z79899 Other long term (current) drug therapy: Secondary | ICD-10-CM

## 2019-05-04 DIAGNOSIS — Z9012 Acquired absence of left breast and nipple: Secondary | ICD-10-CM | POA: Diagnosis not present

## 2019-05-04 DIAGNOSIS — Z170 Estrogen receptor positive status [ER+]: Secondary | ICD-10-CM

## 2019-05-04 DIAGNOSIS — Z08 Encounter for follow-up examination after completed treatment for malignant neoplasm: Secondary | ICD-10-CM

## 2019-05-04 DIAGNOSIS — Z79811 Long term (current) use of aromatase inhibitors: Secondary | ICD-10-CM

## 2019-05-04 DIAGNOSIS — Z7982 Long term (current) use of aspirin: Secondary | ICD-10-CM

## 2019-05-04 DIAGNOSIS — Z853 Personal history of malignant neoplasm of breast: Secondary | ICD-10-CM

## 2019-05-04 NOTE — Progress Notes (Signed)
Taking pills for breast cancer and no concerns. She says her breast has some quick pains that come and go on occasion

## 2019-05-05 NOTE — Progress Notes (Signed)
I connected with Jennifer Maddox on 05/05/19 at 10:15 AM EDT by video enabled telemedicine visit and verified that I am speaking with the correct person using two identifiers.   I discussed the limitations, risks, security and privacy concerns of performing an evaluation and management service by telemedicine and the availability of in-person appointments. I also discussed with the patient that there may be a patient responsible charge related to this service. The patient expressed understanding and agreed to proceed.  Other persons participating in the visit and their role in the encounter:  none  Patient's location:  home Provider's location:  Home  Diagnosis-invasive mammary carcinoma of the left breast pathological prognostic stage IA pT1 ppN0 cM0 ER PR positive and HER-2/neu negative status post mastectomy  Chief Complaint: Routine follow-up of breast cancer on Arimidex  History of present illness: patient is a 73 year old female who was diagnosed with stage I breast cancer in 2011. It was a T1b N0 M0 stage I tumor s/p lumpectomy. Oncotype DX score was 7 and she did not require adjuvant chemotherapy. She did complete adjuvant radiation and 5 years of hormone therapy.She last saw me in November 2018 when she was referred to me for a low IgA level which was nonspecific. She was continuing to get screening mammograms with her primary care doctor. Recent mammogram and ultrasound on 11/24/2018 showed irregular hypoechoic mass in the left breast measuring 5 x 4 x 4 mm. No enlarged or morphologically abnormal lymph nodes in the left axilla. Patient underwent biopsy which showed invasive mammary carcinoma, grade 2, ER greater than 90% positive, PR greater than 90% positive. HER-2 was equivocal by IHC.FISH testing negative  Patient underwent left mastectomy on 12/01/2018.  Final pathology showed 6 mm, grade 2 invasive mammary carcinoma with negative margins.  ER PR positive and HER-2/neu  negative.  3 sentinel and one non-sentinel lymph node was negative for malignancy.  Oncotype testing came back with intermediate risk score of 18 and given her age patient does not require any adjuvant chemotherapy  Interval history: She has occasional pain at the site of her surgery.  She is tolerating Arimidex well and denies any significant hot flashes or joint pains.  She is also been compliant with calcium and vitamin D   Review of Systems  Constitutional: Negative for chills, fever, malaise/fatigue and weight loss.  HENT: Negative for congestion, ear discharge and nosebleeds.   Eyes: Negative for blurred vision.  Respiratory: Negative for cough, hemoptysis, sputum production, shortness of breath and wheezing.   Cardiovascular: Negative for chest pain, palpitations, orthopnea and claudication.  Gastrointestinal: Negative for abdominal pain, blood in stool, constipation, diarrhea, heartburn, melena, nausea and vomiting.  Genitourinary: Negative for dysuria, flank pain, frequency, hematuria and urgency.  Musculoskeletal: Negative for back pain, joint pain and myalgias.  Skin: Negative for rash.  Neurological: Negative for dizziness, tingling, focal weakness, seizures, weakness and headaches.  Endo/Heme/Allergies: Does not bruise/bleed easily.  Psychiatric/Behavioral: Negative for depression and suicidal ideas. The patient does not have insomnia.     Allergies  Allergen Reactions  . Atorvastatin Other (See Comments)    Cramps  . Naproxen Sodium Other (See Comments)    Head and Neck Puritis    Past Medical History:  Diagnosis Date  . Anemia   . Arthritis   . Breast cancer ([place]) 2011   left breast  . Breast cancer, left breast ([place]) 2011  . COPD (chronic obstructive pulmonary disease) ([place])   . Degenerative disc disease, lumbar   .  Diabetes mellitus without complication ([place])   . Diverticulosis   . Hyperlipidemia   . Hypertension   . Personal history of radiation therapy   .  Sleep apnea   . TIA (transient ischemic attack)     Past Surgical History:  Procedure Laterality Date  . ABDOMINAL HYSTERECTOMY     around age 62 ; ovaries intact  . BREAST BIOPSY Left 12/01/2018   Pathology LEFT breast mass 11 o'clock location: Invasive mammary  . BREAST EXCISIONAL BIOPSY Left 2011   breast ca with radation  . CHOLECYSTECTOMY    . COLONOSCOPY W/ POLYPECTOMY    . COLONOSCOPY WITH PROPOFOL N/A 10/29/2017   Procedure: COLONOSCOPY WITH PROPOFOL;  Surgeon: Manya Silvas, MD;  Location: [facility] ENDOSCOPY;  Service: Endoscopy;  Laterality: N/A;  . ESOPHAGOGASTRODUODENOSCOPY (EGD) WITH PROPOFOL N/A 10/29/2017   Procedure: ESOPHAGOGASTRODUODENOSCOPY (EGD) WITH PROPOFOL;  Surgeon: Manya Silvas, MD;  Location: [facility] ENDOSCOPY;  Service: Endoscopy;  Laterality: N/A;  . KNEE ARTHROSCOPY W/ MENISCAL REPAIR Right   . SENTINEL NODE BIOPSY Left 01/17/2019   Procedure: SENTINEL NODE INJECTION;  Surgeon: Benjamine Sprague, DO;  Location: [facility] ORS;  Service: General;  Laterality: Left;  . TONSILLECTOMY    . TOTAL MASTECTOMY Left 01/17/2019   Procedure: TOTAL MASTECTOMY;  Surgeon: Benjamine Sprague, DO;  Location: [facility] ORS;  Service: General;  Laterality: Left;    Social History   Socioeconomic History  . Marital status: Married    Spouse name: Not on file  . Number of children: Not on file  . Years of education: Not on file  . Highest education level: Not on file  Occupational History  . Not on file  Social Needs  . Financial resource strain: Not on file  . Food insecurity:    Worry: Not on file    Inability: Not on file  . Transportation needs:    Medical: Not on file    Non-medical: Not on file  Tobacco Use  . Smoking status: Former Smoker    Last attempt to quit: 12/14/1977    Years since quitting: 41.4  . Smokeless tobacco: Never Used  Substance and Sexual Activity  . Alcohol use: No  . Drug use: No  . Sexual activity: Not on file  Lifestyle  . Physical activity:    Days  per week: Not on file    Minutes per session: Not on file  . Stress: Not on file  Relationships  . Social connections:    Talks on phone: Not on file    Gets together: Not on file    Attends religious service: Not on file    Active member of club or organization: Not on file    Attends meetings of clubs or organizations: Not on file    Relationship status: Not on file  . Intimate partner violence:    Fear of current or ex partner: Not on file    Emotionally abused: Not on file    Physically abused: Not on file    Forced sexual activity: Not on file  Other Topics Concern  . Not on file  Social History Narrative  . Not on file    Family History  Problem Relation Age of Onset  . Lung cancer Mother        smoker; deceased 18  . Heart attack Father        deceased 34  . Melanoma Brother 45       currently 7  . Prostate cancer Brother 69  currently 71  . Melanoma Son 45       below left armpit; currently 73     Current Outpatient Medications:  .  anastrozole (ARIMIDEX) 1 MG tablet, Take 1 tablet (1 mg total) by mouth daily., Disp: 30 tablet, Rfl: 3 .  aspirin EC 81 MG tablet, Take 81 mg by mouth daily., Disp: , Rfl:  .  blood glucose meter kit and supplies, USE TWICE DAILY, Disp: , Rfl:  .  Calcium Carbonate-Vitamin D (CALCIUM 500/D PO), Take 1 tablet by mouth 2 (two) times a day., Disp: , Rfl:  .  clobetasol ointment (TEMOVATE) 9.48 %, Apply 1 application topically daily as needed (itching). , Disp: , Rfl: 1 .  furosemide (LASIX) 20 MG tablet, Take 20 mg by mouth 2 (two) times daily. , Disp: , Rfl:  .  gabapentin (NEURONTIN) 300 MG capsule, Take 300 mg by mouth at bedtime. , Disp: , Rfl:  .  glucose blood test strip, , Disp: , Rfl:  .  ibuprofen (ADVIL,MOTRIN) 800 MG tablet, Take 1 tablet (800 mg total) by mouth every 8 (eight) hours as needed for mild pain or moderate pain., Disp: 30 tablet, Rfl: 0 .  loperamide (ANTI-DIARRHEAL) 2 MG tablet, Take 2 mg elemental  calcium/kg/hr by mouth daily as needed (diarrhea). , Disp: , Rfl:  .  losartan (COZAAR) 25 MG tablet, Take 50 mg by mouth daily. , Disp: , Rfl:  .  MELATONIN PO, Take 12 mg by mouth at bedtime. , Disp: , Rfl:  .  metFORMIN (GLUCOPHAGE) 1000 MG tablet, Take 1,000 mg by mouth 2 (two) times daily with a meal. , Disp: , Rfl:  .  Multiple Vitamins-Minerals (MULTIVITAMIN ADULT PO), Take 1 tablet by mouth daily. , Disp: , Rfl:  .  Polyethylene Glycol 400 (BLINK TEARS OP), Apply 1 drop to eye daily as needed (dry eyes)., Disp: , Rfl:  .  pravastatin (PRAVACHOL) 40 MG tablet, Take 40 mg by mouth daily. , Disp: , Rfl:  .  Probiotic Product ([employer] PROBIOTIC) CHEW, Chew 500 mg by mouth daily at 6 (six) AM., Disp: , Rfl:  .  traZODone (DESYREL) 100 MG tablet, Take 150 mg by mouth at bedtime. , Disp: , Rfl:  .  vitamin B-12 (CYANOCOBALAMIN) 1000 MCG tablet, Take 2,000 mcg by mouth daily. , Disp: , Rfl:   No results found.  No images are attached to the encounter.   CMP Latest Ref Rng & Units 05/03/2019  Glucose 70 - 99 mg/dL 122(H)  BUN 8 - 23 mg/dL 13  Creatinine 0.44 - 1.00 mg/dL 0.88  Sodium 135 - 145 mmol/L 142  Potassium 3.5 - 5.1 mmol/L 3.7  Chloride 98 - 111 mmol/L 101  CO2 22 - 32 mmol/L 31  Calcium 8.9 - 10.3 mg/dL 9.5  Total Protein 6.5 - 8.1 g/dL 6.9  Total Bilirubin 0.3 - 1.2 mg/dL 0.7  Alkaline Phos 38 - 126 U/L 54  AST 15 - 41 U/L 17  ALT 0 - 44 U/L 14   CBC Latest Ref Rng & Units 01/18/2019  WBC 4.0 - 10.5 K/uL 7.2  Hemoglobin 12.0 - 15.0 g/dL 12.1  Hematocrit 36.0 - 46.0 % 37.9  Platelets 150 - 400 K/uL 221     Observation/objective: Appears in no acute distress of a video visit today.  Breathing is nonlabored  Assessment and plan: Patient is a 72 year old female with invasive mammary carcinoma of the left breast pathological prognostic stage I apT1b pN0 cM0 ER PR  positive HER-2/neu negative status post left mastectomy.  She did not require adjuvant radiation treatment and is  currently on Arimidex  Overall patient is tolerating Arimidex well without any significant side effects.  She also continues to be on calcium and vitamin D.  She is scheduled to get a bone density scan on 05/16/2019.  Clinically she is doing well and there are no suspicious symptoms concerning for recurrence.  CMP from 05/03/2019 was unremarkable  Follow-up instructions: I will see her back in 3 months.  No labs  I discussed the assessment and treatment plan with the patient. The patient was provided an opportunity to ask questions and all were answered. The patient agreed with the plan and demonstrated an understanding of the instructions.   The patient was advised to call back or seek an in-person evaluation if the symptoms worsen or if the condition fails to improve as anticipated.   Visit Diagnosis: No diagnosis found.  Dr. Randa Evens, MD, MPH [place] at [facility] [phone] 05/05/2019 7:06 PM

## 2019-05-16 ENCOUNTER — Ambulatory Visit
Admission: RE | Admit: 2019-05-16 | Discharge: 2019-05-16 | Disposition: A | Discharge status: Home / Self Care | Payer: Medicare HMO | Source: Ambulatory Visit | Attending: Oncology | Admitting: Oncology

## 2019-05-16 ENCOUNTER — Other Ambulatory Visit: Payer: Self-pay

## 2019-05-16 DIAGNOSIS — Z780 Asymptomatic menopausal state: Secondary | ICD-10-CM | POA: Diagnosis present

## 2019-05-16 DIAGNOSIS — C50912 Malignant neoplasm of unspecified site of left female breast: Secondary | ICD-10-CM | POA: Insufficient documentation

## 2019-06-07 ENCOUNTER — Encounter: Payer: Self-pay | Admitting: Oncology

## 2019-06-08 ENCOUNTER — Encounter: Payer: Self-pay | Admitting: *Deleted

## 2019-06-08 NOTE — Progress Notes (Signed)
Patient called and states she is having facial swelling, and ankle swelling. States it started last week, but has progressively gotten worse. States one eye is blood shot. She thinks this is was coming from the Arimidex, but she stated she started that in April. Discussed with Dr. Janese Banks.  Patient advised to stop the Arimidex for 2 weeks and call her primary care provider because this is more than likely not related to the Arimidex.  Patient is agreeable.  She is to call me back in 2 weeks.

## 2019-06-17 ENCOUNTER — Other Ambulatory Visit: Payer: Self-pay | Admitting: Oncology

## 2019-06-22 ENCOUNTER — Encounter: Payer: Self-pay | Admitting: *Deleted

## 2019-06-22 NOTE — Progress Notes (Signed)
Patient called to state she is going back on her antihormonal therapy.  States she saw her primary care provider about her swelling, and he said she needed to decrease her sodium intake.  Patient states stopping the antihormonal did not have any effect on her edema.  States she has resumed her antihormonal, and is now on a low sodium diet.  She will follow up in August at her schedule appointment.

## 2019-08-04 ENCOUNTER — Other Ambulatory Visit: Payer: Self-pay

## 2019-08-07 ENCOUNTER — Other Ambulatory Visit: Payer: Self-pay

## 2019-08-07 ENCOUNTER — Encounter: Payer: Self-pay | Admitting: Oncology

## 2019-08-07 ENCOUNTER — Inpatient Hospital Stay: Payer: Medicare HMO | Attending: Oncology | Admitting: Oncology

## 2019-08-07 VITALS — BP 176/72 | HR 56 | Temp 97.8°F | Ht 65.0 in | Wt 204.5 lb

## 2019-08-07 DIAGNOSIS — Z7984 Long term (current) use of oral hypoglycemic drugs: Secondary | ICD-10-CM | POA: Diagnosis not present

## 2019-08-07 DIAGNOSIS — Z853 Personal history of malignant neoplasm of breast: Secondary | ICD-10-CM

## 2019-08-07 DIAGNOSIS — Z923 Personal history of irradiation: Secondary | ICD-10-CM | POA: Insufficient documentation

## 2019-08-07 DIAGNOSIS — Z87891 Personal history of nicotine dependence: Secondary | ICD-10-CM | POA: Insufficient documentation

## 2019-08-07 DIAGNOSIS — Z8249 Family history of ischemic heart disease and other diseases of the circulatory system: Secondary | ICD-10-CM | POA: Diagnosis not present

## 2019-08-07 DIAGNOSIS — R011 Cardiac murmur, unspecified: Secondary | ICD-10-CM | POA: Insufficient documentation

## 2019-08-07 DIAGNOSIS — K5900 Constipation, unspecified: Secondary | ICD-10-CM | POA: Insufficient documentation

## 2019-08-07 DIAGNOSIS — Z170 Estrogen receptor positive status [ER+]: Secondary | ICD-10-CM | POA: Diagnosis not present

## 2019-08-07 DIAGNOSIS — Z801 Family history of malignant neoplasm of trachea, bronchus and lung: Secondary | ICD-10-CM | POA: Insufficient documentation

## 2019-08-07 DIAGNOSIS — Z5181 Encounter for therapeutic drug level monitoring: Secondary | ICD-10-CM

## 2019-08-07 DIAGNOSIS — M5136 Other intervertebral disc degeneration, lumbar region: Secondary | ICD-10-CM | POA: Insufficient documentation

## 2019-08-07 DIAGNOSIS — E119 Type 2 diabetes mellitus without complications: Secondary | ICD-10-CM | POA: Diagnosis not present

## 2019-08-07 DIAGNOSIS — Z79899 Other long term (current) drug therapy: Secondary | ICD-10-CM | POA: Insufficient documentation

## 2019-08-07 DIAGNOSIS — Z9012 Acquired absence of left breast and nipple: Secondary | ICD-10-CM | POA: Insufficient documentation

## 2019-08-07 DIAGNOSIS — Z808 Family history of malignant neoplasm of other organs or systems: Secondary | ICD-10-CM | POA: Insufficient documentation

## 2019-08-07 DIAGNOSIS — Z79811 Long term (current) use of aromatase inhibitors: Secondary | ICD-10-CM | POA: Insufficient documentation

## 2019-08-07 DIAGNOSIS — Z8042 Family history of malignant neoplasm of prostate: Secondary | ICD-10-CM | POA: Diagnosis not present

## 2019-08-07 DIAGNOSIS — C50212 Malignant neoplasm of upper-inner quadrant of left female breast: Secondary | ICD-10-CM | POA: Insufficient documentation

## 2019-08-07 DIAGNOSIS — M7989 Other specified soft tissue disorders: Secondary | ICD-10-CM | POA: Diagnosis not present

## 2019-08-07 DIAGNOSIS — Z08 Encounter for follow-up examination after completed treatment for malignant neoplasm: Secondary | ICD-10-CM | POA: Diagnosis not present

## 2019-08-07 DIAGNOSIS — Z8673 Personal history of transient ischemic attack (TIA), and cerebral infarction without residual deficits: Secondary | ICD-10-CM | POA: Insufficient documentation

## 2019-08-07 NOTE — Progress Notes (Signed)
Increased family stressors:  Husband with memory changes and 75 year old son has moved back home.  BP is elevated at 176/72

## 2019-08-07 NOTE — Progress Notes (Signed)
Hematology/Oncology Consult note [facility]  [phone] [phone]  Patient Care Team: [place] Pink, MD as PCP - General (Family Medicine)   Name of the patient: Jennifer Maddox  [id number]  12/25/1945   Date of visit: 08/07/19  Diagnosis- invasive mammary carcinoma of the left breast pathological prognostic stage IApT1 ppN0 cM0 ER PR positive and HER-2/neu negative status post mastectomy  Chief complaint/ Reason for visit-routine follow-up of breast cancer on Arimidex  Heme/Onc history: patient is a 73 year old female who was diagnosed with stage I breast cancer in 2011. It was a T1b N0 M0 stage I tumor s/p lumpectomy. Oncotype DX score was 7 and she did not require adjuvant chemotherapy. She did complete adjuvant radiation and 5 years of hormone therapy.She last saw me in November 2018 when she was referred to me for a low IgA level which was nonspecific. She was continuing to get screening mammograms with her primary care doctor. Recent mammogram and ultrasound on 11/24/2018 showed irregular hypoechoic mass in the left breast measuring 5 x 4 x 4 mm. No enlarged or morphologically abnormal lymph nodes in the left axilla. Patient underwent biopsy which showed invasive mammary carcinoma, grade 2, ER greater than 90% positive, PR greater than 90% positive. HER-2 was equivocal by IHC.FISH testingnegative  Patient underwent left mastectomy on 12/01/2018. Final pathology showed 4m, grade 2 invasive mammary carcinoma with negative margins. ER PR positive and HER-2/neu negative.3 sentinel and one non-sentinel lymph node was negative for malignancy.Oncotype testing came back with intermediate risk score of 18 and given her age patient does not require any adjuvant chemotherapy.  She did not require postmastectomy radiation and is currently on Arimidex since March 2020.  Interval history-she reports occasional intermittent leg  swelling and constipation since she started taking her arimidex.  He is using Metamucil for her constipation  ECOG PS- 1 Pain scale- 0   Review of systems- Review of Systems  Constitutional: Negative for chills, fever, malaise/fatigue and weight loss.  HENT: Negative for congestion, ear discharge and nosebleeds.   Eyes: Negative for blurred vision.  Respiratory: Negative for cough, hemoptysis, sputum production, shortness of breath and wheezing.   Cardiovascular: Negative for chest pain, palpitations, orthopnea and claudication.  Gastrointestinal: Negative for abdominal pain, blood in stool, constipation, diarrhea, heartburn, melena, nausea and vomiting.  Genitourinary: Negative for dysuria, flank pain, frequency, hematuria and urgency.  Musculoskeletal: Negative for back pain, joint pain and myalgias.  Skin: Negative for rash.  Neurological: Negative for dizziness, tingling, focal weakness, seizures, weakness and headaches.  Endo/Heme/Allergies: Does not bruise/bleed easily.  Psychiatric/Behavioral: Negative for depression and suicidal ideas. The patient does not have insomnia.        Allergies  Allergen Reactions  . Atorvastatin Other (See Comments)    Cramps  . Naproxen Sodium Other (See Comments)    Head and Neck Puritis     Past Medical History:  Diagnosis Date  . Anemia   . Arthritis   . [place]   [place] breast  . [place]  . COPD (chronic obstructive pulmonary disease) ([place]   . Degenerative disc disease, lumbar   . Diabetes mellitus without complication ([place]   . Diverticulosis   . Hyperlipidemia   . Hypertension   . Personal history of radiation therapy   . Sleep apnea   . TIA (transient ischemic attack)      Past Surgical History:  Procedure Laterality Date  . ABDOMINAL HYSTERECTOMY  around age 28 ; ovaries intact  . BREAST BIOPSY Left 12/01/2018   Pathology LEFT breast mass 11 o'clock location: Invasive  mammary  . BREAST EXCISIONAL BIOPSY Left 2011   breast ca with radation  . CHOLECYSTECTOMY    . COLONOSCOPY W/ POLYPECTOMY    . COLONOSCOPY WITH PROPOFOL N/A 10/29/2017   Procedure: COLONOSCOPY WITH PROPOFOL;  Surgeon: Manya Silvas, MD;  Location: [facility] ENDOSCOPY;  Service: Endoscopy;  Laterality: N/A;  . ESOPHAGOGASTRODUODENOSCOPY (EGD) WITH PROPOFOL N/A 10/29/2017   Procedure: ESOPHAGOGASTRODUODENOSCOPY (EGD) WITH PROPOFOL;  Surgeon: Manya Silvas, MD;  Location: [facility] ENDOSCOPY;  Service: Endoscopy;  Laterality: N/A;  . KNEE ARTHROSCOPY W/ MENISCAL REPAIR Right   . SENTINEL NODE BIOPSY Left 01/17/2019   Procedure: SENTINEL NODE INJECTION;  Surgeon: Benjamine Sprague, DO;  Location: [facility] ORS;  Service: General;  Laterality: Left;  . TONSILLECTOMY    . TOTAL MASTECTOMY Left 01/17/2019   Procedure: TOTAL MASTECTOMY;  Surgeon: Benjamine Sprague, DO;  Location: [facility] ORS;  Service: General;  Laterality: Left;    Social History   Socioeconomic History  . Marital status: Married    Spouse name: Not on file  . Number of children: Not on file  . Years of education: Not on file  . Highest education level: Not on file  Occupational History  . Not on file  Social Needs  . Financial resource strain: Not on file  . Food insecurity    Worry: Not on file    Inability: Not on file  . Transportation needs    Medical: Not on file    Non-medical: Not on file  Tobacco Use  . Smoking status: Former Smoker    Quit date: 12/14/1977    Years since quitting: 41.6  . Smokeless tobacco: Never Used  Substance and Sexual Activity  . Alcohol use: No  . Drug use: No  . Sexual activity: Not on file  Lifestyle  . Physical activity    Days per week: Not on file    Minutes per session: Not on file  . Stress: Not on file  Relationships  . Social Herbalist on phone: Not on file    Gets together: Not on file    Attends religious service: Not on file    Active member of club or organization: Not on  file    Attends meetings of clubs or organizations: Not on file    Relationship status: Not on file  . Intimate partner violence    Fear of current or ex partner: Not on file    Emotionally abused: Not on file    Physically abused: Not on file    Forced sexual activity: Not on file  Other Topics Concern  . Not on file  Social History Narrative  . Not on file    Family History  Problem Relation Age of Onset  . Lung cancer Mother        smoker; deceased 38  . Heart attack Father        deceased 37  . Melanoma Brother 57       currently 75  . Prostate cancer Brother 15       currently 62  . Melanoma Son 45       below left armpit; currently 25     Current Outpatient Medications:  .  anastrozole (ARIMIDEX) 1 MG tablet, TAKE 1 TABLET BY MOUTH EVERY DAY, Disp: 90 tablet, Rfl: 1 .  aspirin EC 81 MG tablet,  Take 81 mg by mouth daily., Disp: , Rfl:  .  blood glucose meter kit and supplies, USE TWICE DAILY, Disp: , Rfl:  .  Calcium Carbonate-Vitamin D (CALCIUM 500/D PO), Take 1 tablet by mouth 2 (two) times a day., Disp: , Rfl:  .  clobetasol ointment (TEMOVATE) 4.19 %, Apply 1 application topically daily as needed (itching). , Disp: , Rfl: 1 .  furosemide (LASIX) 20 MG tablet, Take 20 mg by mouth 2 (two) times daily. , Disp: , Rfl:  .  gabapentin (NEURONTIN) 300 MG capsule, Take 300 mg by mouth at bedtime. , Disp: , Rfl:  .  glucose blood test strip, , Disp: , Rfl:  .  ibuprofen (ADVIL,MOTRIN) 800 MG tablet, Take 1 tablet (800 mg total) by mouth every 8 (eight) hours as needed for mild pain or moderate pain., Disp: 30 tablet, Rfl: 0 .  MELATONIN PO, Take 12 mg by mouth at bedtime. , Disp: , Rfl:  .  Multiple Vitamins-Minerals (MULTIVITAMIN ADULT PO), Take 1 tablet by mouth daily. , Disp: , Rfl:  .  pravastatin (PRAVACHOL) 40 MG tablet, Take 40 mg by mouth daily. , Disp: , Rfl:  .  Probiotic Product ([employer] PROBIOTIC) CHEW, Chew 500 mg by mouth daily at 6 (six) AM., Disp: , Rfl:  .   traZODone (DESYREL) 100 MG tablet, Take 150 mg by mouth at bedtime. , Disp: , Rfl:  .  vitamin B-12 (CYANOCOBALAMIN) 1000 MCG tablet, Take 2,000 mcg by mouth daily. , Disp: , Rfl:  .  loperamide (ANTI-DIARRHEAL) 2 MG tablet, Take 2 mg elemental calcium/kg/hr by mouth daily as needed (diarrhea). , Disp: , Rfl:  .  losartan (COZAAR) 25 MG tablet, Take 50 mg by mouth daily. , Disp: , Rfl:  .  metFORMIN (GLUCOPHAGE) 1000 MG tablet, Take 1,000 mg by mouth 2 (two) times daily with a meal. , Disp: , Rfl:  .  Polyethylene Glycol 400 (BLINK TEARS OP), Apply 1 drop to eye daily as needed (dry eyes)., Disp: , Rfl:   Physical exam:  Vitals:   08/07/19 1430  BP: (!) 176/72  Pulse: (!) 56  Temp: 97.8 F (36.6 C)  TempSrc: Tympanic  Weight: 204 lb 8 oz (92.8 kg)  Height: _0  (1.651 m)   Physical Exam HENT:     Head: Normocephalic and atraumatic.  Eyes:     Pupils: Pupils are equal, round, and reactive to light.  Neck:     Musculoskeletal: Normal range of motion.  Cardiovascular:     Rate and Rhythm: Normal rate and regular rhythm.     Heart sounds: Murmur (Systolic murmur positive) present.  Pulmonary:     Effort: Pulmonary effort is normal.     Breath sounds: Normal breath sounds.  Abdominal:     General: Bowel sounds are normal.     Palpations: Abdomen is soft.  Musculoskeletal:     Comments: Trace bilateral pedal edema  Skin:    General: Skin is warm and dry.  Neurological:     Mental Status: She is alert and oriented to person, place, and time.      CMP Latest Ref Rng & Units 05/03/2019  Glucose 70 - 99 mg/dL 122(H)  BUN 8 - 23 mg/dL 13  Creatinine 0.44 - 1.00 mg/dL 0.88  Sodium 135 - 145 mmol/L 142  Potassium 3.5 - 5.1 mmol/L 3.7  Chloride 98 - 111 mmol/L 101  CO2 22 - 32 mmol/L 31  Calcium 8.9 - 10.3 mg/dL 9.5  Total Protein 6.5 - 8.1 g/dL 6.9  Total Bilirubin 0.3 - 1.2 mg/dL 0.7  Alkaline Phos 38 - 126 U/L 54  AST 15 - 41 U/L 17  ALT 0 - 44 U/L 14   CBC Latest Ref  Rng & Units 01/18/2019  WBC 4.0 - 10.5 K/uL 7.2  Hemoglobin 12.0 - 15.0 g/dL 12.1  Hematocrit 36.0 - 46.0 % 37.9  Platelets 150 - 400 K/uL 221      Assessment and plan- Patient is a 73 y.o. female female with invasive mammary carcinoma of the left breast pathological prognostic stage I apT1b pN0 cM0 ER PR positive HER-2/neu negative status post left mastectomy.  She did not require adjuvant radiation treatment and is currently on Arimidex. this is a routine follow-up visit for breast cancer  Patient had a visit with Dr. Lysle Pearl last week and underwent breast exam which was unremarkable.  I will therefore not repeat a breast exam at this time.  She is due for a repeat mammogram in 4 months time which will be coordinated by Dr. Lysle Pearl as well.  I will see her back in 4 months without labs.  Clinically she is tolerating Arimidex well without any significant side effects.  Her baseline bone density scan was normal.  He has mild bilateral leg swelling which may be secondary to Arimidex.  However given her chronic systolic heart murmur, elevated blood pressure in the 170s as well as a heart rate which has been mostly in the 50s even during her prior visits, it may be worthwhile to refer her to cardiology.  I will defer this decision to Dr. Kary Kos at this time.  Patient will see him next month   Visit Diagnosis 1. Visit for monitoring Arimidex therapy   2. Encounter for follow-up surveillance of breast cancer      Dr. Randa Evens, MD, MPH [facility] at [facility] [phone] 08/07/2019 3:01 PM

## 2019-10-23 ENCOUNTER — Other Ambulatory Visit: Payer: Self-pay | Admitting: Surgery

## 2019-10-23 DIAGNOSIS — Z853 Personal history of malignant neoplasm of breast: Secondary | ICD-10-CM

## 2019-11-06 ENCOUNTER — Other Ambulatory Visit: Payer: Self-pay | Admitting: Surgery

## 2019-11-06 DIAGNOSIS — Z853 Personal history of malignant neoplasm of breast: Secondary | ICD-10-CM

## 2019-11-17 ENCOUNTER — Ambulatory Visit
Admission: RE | Admit: 2019-11-17 | Discharge: 2019-11-17 | Disposition: A | Discharge status: Home / Self Care | Payer: Medicare HMO | Source: Ambulatory Visit | Attending: Surgery | Admitting: Surgery

## 2019-11-17 ENCOUNTER — Other Ambulatory Visit: Payer: Medicare HMO

## 2019-11-17 DIAGNOSIS — Z1231 Encounter for screening mammogram for malignant neoplasm of breast: Secondary | ICD-10-CM | POA: Diagnosis not present

## 2019-11-17 DIAGNOSIS — Z853 Personal history of malignant neoplasm of breast: Secondary | ICD-10-CM

## 2019-11-28 ENCOUNTER — Other Ambulatory Visit: Payer: Self-pay | Admitting: Oncology

## 2019-12-07 ENCOUNTER — Ambulatory Visit: Payer: Medicare HMO | Admitting: Oncology

## 2019-12-11 ENCOUNTER — Inpatient Hospital Stay: Payer: Medicare HMO | Admitting: Oncology

## 2019-12-25 ENCOUNTER — Inpatient Hospital Stay: Payer: Medicare HMO | Attending: Oncology | Admitting: Oncology

## 2019-12-25 ENCOUNTER — Other Ambulatory Visit: Payer: Self-pay

## 2019-12-25 ENCOUNTER — Encounter: Payer: Self-pay | Admitting: Oncology

## 2019-12-25 DIAGNOSIS — Z79811 Long term (current) use of aromatase inhibitors: Secondary | ICD-10-CM

## 2019-12-25 DIAGNOSIS — Z5181 Encounter for therapeutic drug level monitoring: Secondary | ICD-10-CM

## 2019-12-25 DIAGNOSIS — Z853 Personal history of malignant neoplasm of breast: Secondary | ICD-10-CM

## 2019-12-25 DIAGNOSIS — Z08 Encounter for follow-up examination after completed treatment for malignant neoplasm: Secondary | ICD-10-CM | POA: Diagnosis not present

## 2019-12-25 NOTE — Progress Notes (Signed)
Patient stated that she had been doing well with no problems. Patient stated that she saw Dr. Lysle Pearl and had her breast exam and mammogram (11/17/2019) and everything was good. Patient also had her bone density done on 05/16/2019. Patient denied breast pain, nipple discharge and any knot/bumps.

## 2019-12-26 NOTE — Progress Notes (Signed)
I connected with Jennifer Maddox on 12/26/19 at  1:00 PM EST by video enabled telemedicine visit and verified that I am speaking with the correct person using two identifiers.   I discussed the limitations, risks, security and privacy concerns of performing an evaluation and management service by telemedicine and the availability of in-person appointments. I also discussed with the patient that there may be a patient responsible charge related to this service. The patient expressed understanding and agreed to proceed.  Other persons participating in the visit and their role in the encounter:  none  Patient's location:  home Provider's location:  Work   Diagnosis- invasive mammary carcinoma of the left breast pathological prognostic stage IApT1 ppN0 cM0 ER PR positive and HER-2/neu negative status post mastectomy   Chief Complaint: Routine follow-up of breast cancer on Arimidex  History of present illness: patient is a 74 year old female who was diagnosed with stage I breast cancer in 2011. It was a T1b N0 M0 stage I tumor s/p lumpectomy. Oncotype DX score was 7 and she did not require adjuvant chemotherapy. She did complete adjuvant radiation and 5 years of hormone therapy.She last saw me in November 2018 when she was referred to me for a low IgA level which was nonspecific. She was continuing to get screening mammograms with her primary care doctor. Recent mammogram and ultrasound on 11/24/2018 showed irregular hypoechoic mass in the left breast measuring 5 x 4 x 4 mm. No enlarged or morphologically abnormal lymph nodes in the left axilla. Patient underwent biopsy which showed invasive mammary carcinoma, grade 2, ER greater than 90% positive, PR greater than 90% positive. HER-2 was equivocal by IHC.FISH testingnegative  Patient underwent left mastectomy on 12/01/2018. Final pathology showed 61m, grade 2 invasive mammary carcinoma with negative margins. ER PR positive and HER-2/neu  negative.3 sentinel and one non-sentinel lymph node was negative for malignancy.Oncotype testing came back with intermediate risk score of 18 and given her age patient does not require any adjuvant chemotherapy.  She did not require postmastectomy radiation and is currently on Arimidex since March 2020.   Interval history she is tolerating Arimidex well without any significant side effects.  She has some ongoing fatigue but denies any Worsening hot flashes or joint pains.   Review of Systems  Constitutional: Positive for malaise/fatigue. Negative for chills, fever and weight loss.  HENT: Negative for congestion, ear discharge and nosebleeds.   Eyes: Negative for blurred vision.  Respiratory: Negative for cough, hemoptysis, sputum production, shortness of breath and wheezing.   Cardiovascular: Negative for chest pain, palpitations, orthopnea and claudication.  Gastrointestinal: Negative for abdominal pain, blood in stool, constipation, diarrhea, heartburn, melena, nausea and vomiting.  Genitourinary: Negative for dysuria, flank pain, frequency, hematuria and urgency.  Musculoskeletal: Negative for back pain, joint pain and myalgias.  Skin: Negative for rash.  Neurological: Negative for dizziness, tingling, focal weakness, seizures, weakness and headaches.  Endo/Heme/Allergies: Does not bruise/bleed easily.  Psychiatric/Behavioral: Negative for depression and suicidal ideas. The patient does not have insomnia.     Allergies  Allergen Reactions  . Atorvastatin Other (See Comments)    Cramps  . Naproxen Sodium Other (See Comments)    Head and Neck Puritis    Past Medical History:  Diagnosis Date  . Anemia   . Arthritis   . Breast cancer ([facility] 2011/2019   left breast  . Breast cancer, left breast ([place] 2011  . COPD (chronic obstructive pulmonary disease) ([place]   . Degenerative disc disease, lumbar   .  Diabetes mellitus without complication ([place])   . Diverticulosis   .  Hyperlipidemia   . Hypertension   . Personal history of radiation therapy   . Sleep apnea   . TIA (transient ischemic attack)     Past Surgical History:  Procedure Laterality Date  . ABDOMINAL HYSTERECTOMY     around age 51 ; ovaries intact  . BREAST BIOPSY Left 12/01/2018   Pathology LEFT breast mass 11 o'clock location: Invasive mammary  . BREAST EXCISIONAL BIOPSY Left 2011   breast ca with radation  . CHOLECYSTECTOMY    . COLONOSCOPY W/ POLYPECTOMY    . COLONOSCOPY WITH PROPOFOL N/A 10/29/2017   Procedure: COLONOSCOPY WITH PROPOFOL;  Surgeon: Manya Silvas, MD;  Location: [facility] ENDOSCOPY;  Service: Endoscopy;  Laterality: N/A;  . ESOPHAGOGASTRODUODENOSCOPY (EGD) WITH PROPOFOL N/A 10/29/2017   Procedure: ESOPHAGOGASTRODUODENOSCOPY (EGD) WITH PROPOFOL;  Surgeon: Manya Silvas, MD;  Location: [facility] ENDOSCOPY;  Service: Endoscopy;  Laterality: N/A;  . KNEE ARTHROSCOPY W/ MENISCAL REPAIR Right   . MASTECTOMY Left 2019  . SENTINEL NODE BIOPSY Left 01/17/2019   Procedure: SENTINEL NODE INJECTION;  Surgeon: Benjamine Sprague, DO;  Location: [facility] ORS;  Service: General;  Laterality: Left;  . TONSILLECTOMY    . TOTAL MASTECTOMY Left 01/17/2019   Procedure: TOTAL MASTECTOMY;  Surgeon: Benjamine Sprague, DO;  Location: [facility] ORS;  Service: General;  Laterality: Left;    Social History   Socioeconomic History  . Marital status: Married    Spouse name: Not on file  . Number of children: Not on file  . Years of education: Not on file  . Highest education level: Not on file  Occupational History  . Not on file  Tobacco Use  . Smoking status: Former Smoker    Quit date: 12/14/1977    Years since quitting: 42.0  . Smokeless tobacco: Never Used  Substance and Sexual Activity  . Alcohol use: No  . Drug use: No  . Sexual activity: Not on file  Other Topics Concern  . Not on file  Social History Narrative  . Not on file   Social Determinants of Health   Financial Resource Strain:   .  Difficulty of Paying Living Expenses: Not on file  Food Insecurity:   . Worried About Charity fundraiser in the Last Year: Not on file  . Ran Out of Food in the Last Year: Not on file  Transportation Needs:   . Lack of Transportation (Medical): Not on file  . Lack of Transportation (Non-Medical): Not on file  Physical Activity:   . Days of Exercise per Week: Not on file  . Minutes of Exercise per Session: Not on file  Stress:   . Feeling of Stress : Not on file  Social Connections:   . Frequency of Communication with Friends and Family: Not on file  . Frequency of Social Gatherings with Friends and Family: Not on file  . Attends Religious Services: Not on file  . Active Member of Clubs or Organizations: Not on file  . Attends Archivist Meetings: Not on file  . Marital Status: Not on file  Intimate Partner Violence:   . Fear of Current or Ex-Partner: Not on file  . Emotionally Abused: Not on file  . Physically Abused: Not on file  . Sexually Abused: Not on file    Family History  Problem Relation Age of Onset  . Lung cancer Mother        smoker; deceased 25  .  Heart attack Father        deceased 68  . Melanoma Brother 75       currently 70  . Prostate cancer Brother 61       currently 57  . Melanoma Son 45       below left armpit; currently 46     Current Outpatient Medications:  .  amLODipine (NORVASC) 2.5 MG tablet, Take 2.5 mg by mouth daily., Disp: , Rfl:  .  anastrozole (ARIMIDEX) 1 MG tablet, TAKE 1 TABLET BY MOUTH EVERY DAY, Disp: 90 tablet, Rfl: 1 .  aspirin EC 81 MG tablet, Take 81 mg by mouth daily., Disp: , Rfl:  .  blood glucose meter kit and supplies, USE TWICE DAILY, Disp: , Rfl:  .  Calcium Carbonate-Vitamin D (CALCIUM 500/D PO), Take 1 tablet by mouth 2 (two) times a day., Disp: , Rfl:  .  clobetasol ointment (TEMOVATE) 3.82 %, Apply 1 application topically daily as needed (itching). , Disp: , Rfl: 1 .  furosemide (LASIX) 20 MG tablet, Take  20 mg by mouth 2 (two) times daily. , Disp: , Rfl:  .  gabapentin (NEURONTIN) 300 MG capsule, Take 300 mg by mouth at bedtime. , Disp: , Rfl:  .  glipiZIDE (GLUCOTROL XL) 2.5 MG 24 hr tablet, Take 1 tablet by mouth daily., Disp: , Rfl:  .  glucose blood test strip, , Disp: , Rfl:  .  ibuprofen (ADVIL,MOTRIN) 800 MG tablet, Take 1 tablet (800 mg total) by mouth every 8 (eight) hours as needed for mild pain or moderate pain., Disp: 30 tablet, Rfl: 0 .  losartan-hydrochlorothiazide (HYZAAR) 100-12.5 MG tablet, Take 1 tablet by mouth daily., Disp: , Rfl:  .  MELATONIN PO, Take 12 mg by mouth at bedtime. , Disp: , Rfl:  .  Multiple Vitamins-Minerals (MULTIVITAMIN ADULT PO), Take 1 tablet by mouth daily. , Disp: , Rfl:  .  Polyethylene Glycol 400 (BLINK TEARS OP), Apply 1 drop to eye daily as needed (dry eyes)., Disp: , Rfl:  .  pravastatin (PRAVACHOL) 40 MG tablet, Take 40 mg by mouth daily. , Disp: , Rfl:  .  Probiotic Product ([employer] PROBIOTIC) CHEW, Chew 500 mg by mouth daily at 6 (six) AM., Disp: , Rfl:  .  traZODone (DESYREL) 100 MG tablet, Take 150 mg by mouth at bedtime. , Disp: , Rfl:  .  vitamin B-12 (CYANOCOBALAMIN) 1000 MCG tablet, Take 2,000 mcg by mouth daily. , Disp: , Rfl:   No results found.  No images are attached to the encounter.   CMP Latest Ref Rng & Units 05/03/2019  Glucose 70 - 99 mg/dL 122(H)  BUN 8 - 23 mg/dL 13  Creatinine 0.44 - 1.00 mg/dL 0.88  Sodium 135 - 145 mmol/L 142  Potassium 3.5 - 5.1 mmol/L 3.7  Chloride 98 - 111 mmol/L 101  CO2 22 - 32 mmol/L 31  Calcium 8.9 - 10.3 mg/dL 9.5  Total Protein 6.5 - 8.1 g/dL 6.9  Total Bilirubin 0.3 - 1.2 mg/dL 0.7  Alkaline Phos 38 - 126 U/L 54  AST 15 - 41 U/L 17  ALT 0 - 44 U/L 14   CBC Latest Ref Rng & Units 01/18/2019  WBC 4.0 - 10.5 K/uL 7.2  Hemoglobin 12.0 - 15.0 g/dL 12.1  Hematocrit 36.0 - 46.0 % 37.9  Platelets 150 - 400 K/uL 221     Observation/objective: Appears in no acute distress on video visit today.   Breathing is nonlabored  Assessment  and plan: Patient is a 74 year old female with history of stage I ER/PR positive HER-2/neu negative left breast carcinoma s/p left mastectomy and currently on Arimidex.  This is a routine follow-up visit for breast cancer  Clinically patient is doing well and no concerning symptoms of recurrence based on today's visit.  She is tolerating Arimidex well without any significant side effects.  Her Bone density scan from June 2020 was also unremarkable.  She will continue taking calcium and vitamin D.  I will see her back in 6 months time with CBC with differential and CMP for an in person visit for breast exam.  It would be okay for patient to receive her Covid vaccine  Follow-up instructions: As above  I discussed the assessment and treatment plan with the patient. The patient was provided an opportunity to ask questions and all were answered. The patient agreed with the plan and demonstrated an understanding of the instructions.   The patient was advised to call back or seek an in-person evaluation if the symptoms worsen or if the condition fails to improve as anticipated.   Visit Diagnosis: 1. Encounter for follow-up surveillance of breast cancer   2. Visit for monitoring Arimidex therapy     Dr. Randa Evens, MD, MPH [facility] at [facility] Tel- [phone] 12/26/2019 1:48 PM

## 2020-05-10 ENCOUNTER — Other Ambulatory Visit: Payer: Self-pay | Admitting: *Deleted

## 2020-05-10 DIAGNOSIS — C50919 Malignant neoplasm of unspecified site of unspecified female breast: Secondary | ICD-10-CM

## 2020-05-10 DIAGNOSIS — I890 Lymphedema, not elsewhere classified: Secondary | ICD-10-CM

## 2020-05-14 ENCOUNTER — Other Ambulatory Visit: Payer: Self-pay | Admitting: *Deleted

## 2020-05-14 DIAGNOSIS — I890 Lymphedema, not elsewhere classified: Secondary | ICD-10-CM

## 2020-05-20 ENCOUNTER — Other Ambulatory Visit: Payer: Self-pay | Admitting: Oncology

## 2020-05-27 ENCOUNTER — Ambulatory Visit: Payer: Medicare HMO | Attending: Oncology | Admitting: Occupational Therapy

## 2020-05-27 ENCOUNTER — Encounter: Payer: Self-pay | Admitting: Occupational Therapy

## 2020-05-27 ENCOUNTER — Other Ambulatory Visit: Payer: Self-pay

## 2020-05-27 DIAGNOSIS — I972 Postmastectomy lymphedema syndrome: Secondary | ICD-10-CM

## 2020-05-27 NOTE — Therapy (Signed)
[facility] [address] [place], [address] Phone: [phone]   Fax:  [phone]  Occupational Therapy Evaluation  Patient Details  Name: Jennifer Maddox MRN: [id number] Date of Birth: 1946-04-15 Referring Provider (OT): Dr Janese Banks   Encounter Date: 05/27/2020   OT End of Session - 05/27/20 1150    Visit Number 1    Number of Visits 6    Date for OT Re-Evaluation 07/08/20    OT Start Time 0930    OT Stop Time 1028    OT Time Calculation (min) 58 min    Activity Tolerance Patient tolerated treatment well    Behavior During Therapy [facility] for tasks assessed/performed           Past Medical History:  Diagnosis Date  . Anemia   . Arthritis   . Breast cancer ([place]) 2011/2019   left breast  . Breast cancer, left breast ([place]) 2011  . COPD (chronic obstructive pulmonary disease) ([place])   . Degenerative disc disease, lumbar   . Diabetes mellitus without complication ([place])   . Diverticulosis   . Hyperlipidemia   . Hypertension   . Personal history of radiation therapy   . Sleep apnea   . TIA (transient ischemic attack)     Past Surgical History:  Procedure Laterality Date  . ABDOMINAL HYSTERECTOMY     around age 41 ; ovaries intact  . BREAST BIOPSY Left 12/01/2018   Pathology LEFT breast mass 11 o'clock location: Invasive mammary  . BREAST EXCISIONAL BIOPSY Left 2011   breast ca with radation  . CHOLECYSTECTOMY    . COLONOSCOPY W/ POLYPECTOMY    . COLONOSCOPY WITH PROPOFOL N/A 10/29/2017   Procedure: COLONOSCOPY WITH PROPOFOL;  Surgeon: Manya Silvas, MD;  Location: [facility] ENDOSCOPY;  Service: Endoscopy;  Laterality: N/A;  . ESOPHAGOGASTRODUODENOSCOPY (EGD) WITH PROPOFOL N/A 10/29/2017   Procedure: ESOPHAGOGASTRODUODENOSCOPY (EGD) WITH PROPOFOL;  Surgeon: Manya Silvas, MD;  Location: [facility] ENDOSCOPY;  Service: Endoscopy;  Laterality: N/A;  . KNEE ARTHROSCOPY W/ MENISCAL REPAIR Right   . MASTECTOMY  Left 2019  . SENTINEL NODE BIOPSY Left 01/17/2019   Procedure: SENTINEL NODE INJECTION;  Surgeon: Benjamine Sprague, DO;  Location: [facility] ORS;  Service: General;  Laterality: Left;  . TONSILLECTOMY    . TOTAL MASTECTOMY Left 01/17/2019   Procedure: TOTAL MASTECTOMY;  Surgeon: Benjamine Sprague, DO;  Location: [facility] ORS;  Service: General;  Laterality: Left;    There were no vitals filed for this visit.   Subjective Assessment - 05/27/20 1139    Subjective  My L arm was always little bit larger - but the last few months I have more swelling on my chest and under arm - and arm is heavier and more swollen    Pertinent History Pt had L breast CA with lumpectomy and radiation 2011 , and then 12/19 L breast CA return, had early last year L mastectomy - appear 3 ln removed -and on Arimedex March 2020 - refer to OT for L UE and thoracic lymphedema    Patient Stated Goals Do not want my lymphedema or swelling in my  arm to get worse    Currently in Pain? Yes    Pain Score 7     Pain Location Shoulder    Pain Orientation Left    Pain Descriptors / Indicators Aching    Pain Onset More than a month ago    Pain Frequency Occasional  [facility] OT Assessment - 05/27/20 0001      Assessment   Medical Diagnosis L UE and thoracic lymphdema     Referring Provider (OT) Dr Janese Banks    Onset Date/Surgical Date 02/12/20    Hand Dominance Right      Home  Environment   Lives With Spouse      Prior Function   Vocation Retired    Leisure Husband has lung CA stage 4 and need the last few months more physical help from pt- lifting with L arm - son past away March 2021 - do house work , some yardwork       AROM   Overall AROM Comments AROM bilateral shoulders WNL - upper traps tight ,             LYMPHEDEMA/ONCOLOGY QUESTIONNAIRE - 05/27/20 0001      Right Upper Extremity Lymphedema   15 cm Proximal to Olecranon Process 39.5 cm    10 cm Proximal to Olecranon Process 37.3 cm    Olecranon Process 27 cm     15 cm Proximal to Ulnar Styloid Process 26.3 cm    10 cm Proximal to Ulnar Styloid Process 23 cm    Just Proximal to Ulnar Styloid Process 17.5 cm    Across Hand at [employer] 20 cm    At [place] of 2nd Digit 7.5 cm    At [facility] of Thumb 7 cm      Left Upper Extremity Lymphedema   15 cm Proximal to Olecranon Process 40.8 cm    10 cm Proximal to Olecranon Process 40.3 cm    Olecranon Process 29.5 cm    15 cm Proximal to Ulnar Styloid Process 27.8 cm    10 cm Proximal to Ulnar Styloid Process 24.5 cm    Just Proximal to Ulnar Styloid Process 17.3 cm    Across Hand at [employer] 19.5 cm    At [place] of 2nd Digit 7.5 cm    At [place] of Thumb 7 cm                  Pt to get over the counter sleeve and glove from [employer] for L UE  And jovipak unilateral post mastectomy pad to wear   And MLD daily - and scar massage  Hand out provided :  Do manual lymph drainage once each day to help decrease swelling.  This should take you about 15-20 minutes depending on the size of your limb.  For Left Arm:  1. Hug yourself at the base of your neck and do 8 small circles; and 2 fingers behind clavicle 8 x 2. Do 5-8 semicircles at R armpit and L groin 3. Pump across chest from left to right 8 times 4. Pump down the left side of trunk from armpit to groin 8 times 5. Pump up the outside of left upper arm 8 times and inside of upper arm to outside from elbow to over shoulder  6. Pump across chest from L to R 8 times 7. Pump  down the left side of trunk from armpit to groin 8 times 14. Do 5-8 semicircles at right armpit and left groin 4-5 times 15. Repeat nr. 1  SLOW and LIGHT with only your palm NOT FINGERTIPS         OT Education - 05/27/20 1149    Education Details findings of eval and HEP    Person(s) Educated Patient    Methods Explanation;Demonstration;Tactile cues;Handout;Verbal  cues    Comprehension Verbalized understanding;Returned demonstration                OT Long Term Goals - 05/27/20 1157      OT LONG TERM GOAL #1   Title Pt to be indepedent in  MLD, and wearing of compression to decrease circumference in L UE by 1 cm and maintain her lymphdema    Baseline no knowledge    Time 3    Period Weeks    Status New    Target Date 06/17/20      OT LONG TERM GOAL #2   Title Pt to be fitted with correctly compression garments to maintain her lymphedema in L upper qaudrant to prevent infection    Baseline no compression, knowledge on MLD    Time 6    Period Weeks    Status New    Target Date 07/08/20                 Plan - 05/27/20 1151    Clinical Impression Statement Pt present at OT eval with L upper quadrant lymphedema - pt had breast CA in 2011 and 2021 - had radiation and lumpectomy first time and 2nd time mastectomy - pt report her arm was always little larger - but the last few months develop lymphedema in thoracic and arm got larger and heavier - pt husband has stage 4 lung CA and needed more help -and she started lifing and helping him physically , and doing more of the housework and groceries. L UE circumference increase by 1.5 in forearm, elbow 2.5 cm and upper arm 3.3 cm - wiith some scar tissue and lymphedema in chest - recommend for pt to get jovipak unilateral breast pad to use , taught her self MLD and to wear during day over the counter compression sleeve and glove - goal to decrease elbow and upper arm circumference and setting up approriate maintance program to keep lymphedema under contral and prevent infection    OT Occupational Profile and History Problem Focused Assessment - Including review of records relating to presenting problem    Occupational performance deficits (Please refer to evaluation for details): IADL's;ADL's;Leisure    Body Structure / Function / Physical Skills Edema;Strength;Pain;ADL;Decreased knowledge of precautions    Rehab Potential Good    Clinical Decision Making Limited treatment  options, no task modification necessary    Comorbidities Affecting Occupational Performance: --   radiation ,L  breast CA x 2   Modification or Assistance to Complete Evaluation  No modification of tasks or assist necessary to complete eval    OT Frequency 1x / week    OT Duration 6 weeks    OT Treatment/Interventions Self-care/ADL training;Therapeutic exercise;Patient/family education;Compression bandaging;Manual lymph drainage;Scar mobilization    Plan assess if she got her compression , and wearing correctly -and progress in circumderence iwth HEP    OT Home Exercise Plan see HEP           Patient will benefit from skilled therapeutic intervention in order to improve the following deficits and impairments:   Body Structure / Function / Physical Skills: Edema, Strength, Pain, ADL, Decreased knowledge of precautions       Visit Diagnosis: Postmastectomy lymphedema syndrome - Plan: Ot plan of care cert/re-cert    Problem List Patient Active Problem List   Diagnosis Date Noted  . Breast cancer ([place]) 01/17/2019  . Goals of care, counseling/discussion 12/08/2018  . Fatty liver disease, nonalcoholic 62/95/2841  .  Heart murmur previously undiagnosed 12/15/2017  . Aortic valve sclerosis 12/15/2017  . Breast cancer, left breast ([place]) 02/18/2016  . Fecal incontinence 12/31/2014  . Irritable bowel syndrome with constipation and diarrhea 12/31/2014  . Lumbar radiculitis 06/08/2014  . DDD (degenerative disc disease), lumbar 05/21/2014  . Dyspnea on exertion 05/17/2014  . Edema 05/17/2014  . Trochanteric bursitis of left hip 04/19/2014  . Anemia 01/23/2014  . Diverticulosis 01/23/2014  . DM type 2 (diabetes mellitus, type 2) ([place]) 01/23/2014  . H/O adenomatous polyp of colon 01/23/2014  . HTN (hypertension) 01/23/2014  . Hyperlipidemia 01/23/2014  . Osteoarthritis 01/23/2014  . Other symptoms involving digestive system(787.99) 01/23/2014  . Personal history of breast cancer  01/23/2014  . Sleep apnea 01/23/2014    Rosalyn Gess  OTR/l,CLT 05/27/2020, 12:02 PM  [facility] [address], [place], [address] Phone: [phone]   Fax:  [phone]  Name: Jennifer Maddox MRN: [id number] Date of Birth: 25-Jan-1946

## 2020-05-27 NOTE — Patient Instructions (Signed)
Pt to get over the counter sleeve and glove from [employer] for L UE  And jovipak unilateral post mastectomy pad to wear   And MLD daily - and scar massage  Hand out provided :  Do manual lymph drainage once each day to help decrease swelling.  This should take you about 15-20 minutes depending on the size of your limb.  For Left Arm:  1. Hug yourself at the base of your neck and do 8 small circles; and 2 fingers behind clavicle 8 x 2. Do 5-8 semicircles at R armpit and L groin 3. Pump across chest from left to right 8 times 4. Pump down the left side of trunk from armpit to groin 8 times 5. Pump up the outside of left upper arm 8 times and inside of upper arm to outside from elbow to over shoulder  6. Pump across chest from L to R 8 times 7. Pump  down the left side of trunk from armpit to groin 8 times 14. Do 5-8 semicircles at right armpit and left groin 4-5 times 15. Repeat nr. 1  SLOW and LIGHT with only your palm NOT FINGERTIPS

## 2020-06-05 ENCOUNTER — Ambulatory Visit: Payer: Medicare HMO | Admitting: Occupational Therapy

## 2020-06-05 ENCOUNTER — Other Ambulatory Visit: Payer: Self-pay

## 2020-06-05 DIAGNOSIS — I972 Postmastectomy lymphedema syndrome: Secondary | ICD-10-CM

## 2020-06-05 NOTE — Patient Instructions (Signed)
See note

## 2020-06-05 NOTE — Therapy (Signed)
[facility] [address], [place], [address] Phone: [phone]   Fax:  [phone]  Occupational Therapy Treatment  Patient Details  Name: Jennifer Maddox MRN: [id number] Date of Birth: 09/27/1946 Referring Provider (OT): Dr Janese Banks   Encounter Date: 06/05/2020   OT End of Session - 06/05/20 1345    Visit Number 2    Number of Visits 6    Date for OT Re-Evaluation 07/08/20    OT Start Time 2694    OT Stop Time 1340    OT Time Calculation (min) 33 min    Activity Tolerance Patient tolerated treatment well    Behavior During Therapy [facility] for tasks assessed/performed           Past Medical History:  Diagnosis Date  . Anemia   . Arthritis   . Breast cancer ([place]) 2011/2019   left breast  . Breast cancer, left breast ([place]) 2011  . COPD (chronic obstructive pulmonary disease) ([place])   . Degenerative disc disease, lumbar   . Diabetes mellitus without complication ([place])   . Diverticulosis   . Hyperlipidemia   . Hypertension   . Personal history of radiation therapy   . Sleep apnea   . TIA (transient ischemic attack)     Past Surgical History:  Procedure Laterality Date  . ABDOMINAL HYSTERECTOMY     around age 23 ; ovaries intact  . BREAST BIOPSY Left 12/01/2018   Pathology LEFT breast mass 11 o'clock location: Invasive mammary  . BREAST EXCISIONAL BIOPSY Left 2011   breast ca with radation  . CHOLECYSTECTOMY    . COLONOSCOPY W/ POLYPECTOMY    . COLONOSCOPY WITH PROPOFOL N/A 10/29/2017   Procedure: COLONOSCOPY WITH PROPOFOL;  Surgeon: Manya Silvas, MD;  Location: [facility] ENDOSCOPY;  Service: Endoscopy;  Laterality: N/A;  . ESOPHAGOGASTRODUODENOSCOPY (EGD) WITH PROPOFOL N/A 10/29/2017   Procedure: ESOPHAGOGASTRODUODENOSCOPY (EGD) WITH PROPOFOL;  Surgeon: Manya Silvas, MD;  Location: [facility] ENDOSCOPY;  Service: Endoscopy;  Laterality: N/A;  . KNEE ARTHROSCOPY W/ MENISCAL REPAIR Right   . MASTECTOMY  Left 2019  . SENTINEL NODE BIOPSY Left 01/17/2019   Procedure: SENTINEL NODE INJECTION;  Surgeon: Benjamine Sprague, DO;  Location: [facility] ORS;  Service: General;  Laterality: Left;  . TONSILLECTOMY    . TOTAL MASTECTOMY Left 01/17/2019   Procedure: TOTAL MASTECTOMY;  Surgeon: Benjamine Sprague, DO;  Location: [facility] ORS;  Service: General;  Laterality: Left;    There were no vitals filed for this visit.   Subjective Assessment - 06/05/20 1341    Subjective  You need to go over with me the massage again -the 2nd step I am not getting -and then they had to order the compression garments    Pertinent History Pt had L breast CA with lumpectomy and radiation 2011 , and then 12/19 L breast CA return, had early last year L mastectomy - appear 3 ln removed -and on Arimedex March 2020 - refer to OT for L UE and thoracic lymphedema    Patient Stated Goals Do not want my lymphedema or swelling in my  arm to get worse    Currently in Pain? No/denies               LYMPHEDEMA/ONCOLOGY QUESTIONNAIRE - 06/05/20 0001      Left Upper Extremity Lymphedema   15 cm Proximal to Olecranon Process 43 cm    10 cm Proximal to Olecranon Process 41 cm    Olecranon  Process 30 cm    15 cm Proximal to Ulnar Styloid Process 27.8 cm    10 cm Proximal to Ulnar Styloid Process 25 cm    Just Proximal to Ulnar Styloid Process 17 cm    Across Hand at [employer] 19.2 cm    At [place] of 2nd Digit 7.2 cm    At [facility] of Thumb 6.8 cm          Pt arrive- L UE circumference measured - proximal forearm to upper arm increase this past week Pt still awaiting compression garments     Pt was measured last week for over the counter sleeve and glove from [facility] for L UE  And jovipak unilateral post mastectomy pad to wear  Pt to contact me if she gets it before next appt  Pt wanted me to review MLD again  Reviewed scar massage - doing better  Review MLD Hand out provided last time:  Do manual lymph drainage once each day  to help decrease swelling.  This should take you about 15-20 minutes depending on the size of your limb.  For Left Arm:  1.         Hug yourself at the base of your neck and do 8 small circles; and 2 fingers behind clavicle 8 x 2.         Do 5-8 semicircles at R armpit and L groin 3. Pump across chest from left to right 8 times 4. Pump down the left side of trunk from armpit to groin 8 times 5.         Pump up the outside of left upper arm 8 times and inside of upper arm to outside from elbow to over shoulder  6.         Pump across chest from L to R 8 times 7. Pump  down the left side of trunk from armpit to groin 8 times 14. Do 5-8 semicircles at right armpit and left groin 4-5 times 15. Repeat nr. 1  SLOW and LIGHT with only your palm NOT FINGERTIPS  And then fitted with temporary compression - isotoner glove , soft tubigrip hand to elbow and tubigrip E from wrist to uppearm - to wear most all the time                 OT Education - 06/05/20 1342    Education Details MLD and wearing compression    Person(s) Educated Patient    Methods Explanation;Demonstration;Tactile cues;Handout;Verbal cues    Comprehension Verbalized understanding;Returned demonstration               OT Long Term Goals - 05/27/20 1157      OT LONG TERM GOAL #1   Title Pt to be indepedent in  MLD, and wearing of compression to decrease circumference in L UE by 1 cm and maintain her lymphdema    Baseline no knowledge    Time 3    Period Weeks    Status New    Target Date 06/17/20      OT LONG TERM GOAL #2   Title Pt to be fitted with correctly compression garments to maintain her lymphedema in L upper qaudrant to prevent infection    Baseline no compression, knowledge on MLD    Time 6    Period Weeks    Status New    Target Date 07/08/20  Plan - 06/05/20 1346    Clinical Impression Statement Pt arrive this date - waiting for her compression sleeve and  glove -as well as jovipak breast pad- her L UE did increase again in proximal forearm to upper arm- did fit her with temporary compression this date - isotoner glove, soft tubigrip hand to elbow and tubigrip E fomr wrist to axilla- to wear most all the time -and reviewed again MLD - pt to contact if her garments come in prior to next appt    OT Occupational Profile and History Problem Focused Assessment - Including review of records relating to presenting problem    Occupational performance deficits (Please refer to evaluation for details): IADL's;ADL's;Leisure    Body Structure / Function / Physical Skills Edema;Strength;Pain;ADL;Decreased knowledge of precautions    Rehab Potential Good    Clinical Decision Making Limited treatment options, no task modification necessary    Modification or Assistance to Complete Evaluation  No modification of tasks or assist necessary to complete eval    OT Frequency 1x / week    OT Duration 6 weeks    OT Treatment/Interventions Self-care/ADL training;Therapeutic exercise;Patient/family education;Compression bandaging;Manual lymph drainage;Scar mobilization    Plan assess if she got her compression , and wearing correctly -and progress in circumderence iwth HEP    OT Home Exercise Plan see HEP    Consulted and Agree with Plan of Care Patient           Patient will benefit from skilled therapeutic intervention in order to improve the following deficits and impairments:   Body Structure / Function / Physical Skills: Edema, Strength, Pain, ADL, Decreased knowledge of precautions       Visit Diagnosis: Postmastectomy lymphedema syndrome    Problem List Patient Active Problem List   Diagnosis Date Noted  . Breast cancer ([place]) 01/17/2019  . Goals of care, counseling/discussion 12/08/2018  . Fatty liver disease, nonalcoholic 10/30/3566  . Heart murmur previously undiagnosed 12/15/2017  . Aortic valve sclerosis 12/15/2017  . Breast cancer, left breast  ([place]) 02/18/2016  . Fecal incontinence 12/31/2014  . Irritable bowel syndrome with constipation and diarrhea 12/31/2014  . Lumbar radiculitis 06/08/2014  . DDD (degenerative disc disease), lumbar 05/21/2014  . Dyspnea on exertion 05/17/2014  . Edema 05/17/2014  . Trochanteric bursitis of left hip 04/19/2014  . Anemia 01/23/2014  . Diverticulosis 01/23/2014  . DM type 2 (diabetes mellitus, type 2) ([place]) 01/23/2014  . H/O adenomatous polyp of colon 01/23/2014  . HTN (hypertension) 01/23/2014  . Hyperlipidemia 01/23/2014  . Osteoarthritis 01/23/2014  . Other symptoms involving digestive system(787.99) 01/23/2014  . Personal history of breast cancer 01/23/2014  . Sleep apnea 01/23/2014    Rosalyn Gess OTR/L,CLT  06/05/2020, 1:52 PM  [facility] [address], [place], [address] Phone: [phone]   Fax:  [phone]  Name: AVERLEIGH SAVARY MRN: [id number] Date of Birth: May 26, 1946

## 2020-06-13 ENCOUNTER — Ambulatory Visit: Payer: Medicare HMO | Attending: Oncology | Admitting: Occupational Therapy

## 2020-06-13 ENCOUNTER — Other Ambulatory Visit: Payer: Self-pay

## 2020-06-13 DIAGNOSIS — I972 Postmastectomy lymphedema syndrome: Secondary | ICD-10-CM

## 2020-06-13 NOTE — Therapy (Signed)
[facility] [address], [place], [address] Phone: [phone]   Fax:  [phone]  Occupational Therapy Treatment  Patient Details  Name: Jennifer Maddox MRN: [id number] Date of Birth: 06-Jun-1946 Referring Provider (OT): Dr Janese Banks   Encounter Date: 06/13/2020   OT End of Session - 06/13/20 1137    Visit Number 3    Number of Visits 6    Date for OT Re-Evaluation 07/08/20    OT Start Time 1137    OT Stop Time 1219    OT Time Calculation (min) 42 min    Activity Tolerance Patient tolerated treatment well    Behavior During Therapy [facility] for tasks assessed/performed           Past Medical History:  Diagnosis Date  . Anemia   . Arthritis   . Breast cancer ([place]) 2011/2019   left breast  . Breast cancer, left breast ([place]) 2011  . COPD (chronic obstructive pulmonary disease) ([place])   . Degenerative disc disease, lumbar   . Diabetes mellitus without complication ([place])   . Diverticulosis   . Hyperlipidemia   . Hypertension   . Personal history of radiation therapy   . Sleep apnea   . TIA (transient ischemic attack)     Past Surgical History:  Procedure Laterality Date  . ABDOMINAL HYSTERECTOMY     around age 48 ; ovaries intact  . BREAST BIOPSY Left 12/01/2018   Pathology LEFT breast mass 11 o'clock location: Invasive mammary  . BREAST EXCISIONAL BIOPSY Left 2011   breast ca with radation  . CHOLECYSTECTOMY    . COLONOSCOPY W/ POLYPECTOMY    . COLONOSCOPY WITH PROPOFOL N/A 10/29/2017   Procedure: COLONOSCOPY WITH PROPOFOL;  Surgeon: Manya Silvas, MD;  Location: [facility] ENDOSCOPY;  Service: Endoscopy;  Laterality: N/A;  . ESOPHAGOGASTRODUODENOSCOPY (EGD) WITH PROPOFOL N/A 10/29/2017   Procedure: ESOPHAGOGASTRODUODENOSCOPY (EGD) WITH PROPOFOL;  Surgeon: Manya Silvas, MD;  Location: [facility] ENDOSCOPY;  Service: Endoscopy;  Laterality: N/A;  . KNEE ARTHROSCOPY W/ MENISCAL REPAIR Right   . MASTECTOMY  Left 2019  . SENTINEL NODE BIOPSY Left 01/17/2019   Procedure: SENTINEL NODE INJECTION;  Surgeon: Benjamine Sprague, DO;  Location: [facility] ORS;  Service: General;  Laterality: Left;  . TONSILLECTOMY    . TOTAL MASTECTOMY Left 01/17/2019   Procedure: TOTAL MASTECTOMY;  Surgeon: Benjamine Sprague, DO;  Location: [facility] ORS;  Service: General;  Laterality: Left;    There were no vitals filed for this visit.   Subjective Assessment - 06/13/20 1231    Subjective  I have picked this sleeve up onTuesday - but the sleeve and glove are to tight, and then you need to show me how to wear this compression jovipak under my bra- my husband needs more help - I need hospital bed    Pertinent History Pt had L breast CA with lumpectomy and radiation 2011 , and then 12/19 L breast CA return, had early last year L mastectomy - appear 3 ln removed -and on Arimedex March 2020 - refer to OT for L UE and thoracic lymphedema    Patient Stated Goals Do not want my lymphedema or swelling in my  arm to get worse    Currently in Pain? No/denies               LYMPHEDEMA/ONCOLOGY QUESTIONNAIRE - 06/13/20 0001      Left Upper Extremity Lymphedema   15 cm Proximal to Olecranon Process 42  cm    10 cm Proximal to Olecranon Process 39.5 cm    Olecranon Process 29.5 cm    15 cm Proximal to Ulnar Styloid Process 27.5 cm    10 cm Proximal to Ulnar Styloid Process 25.5 cm    Just Proximal to Ulnar Styloid Process 17.2 cm    Across Hand at [employer] 19.3 cm    At [place] of 2nd Digit 7 cm    At [facility] of Thumb 6.8 cm          Pt has been wearing the last week temporary compression - isotoner glove, soft tubigrip hand to elbow and tubigrip E from mid forearm to axilla- pt's circumference did not increase this past week  pt arrive with [employer] over the counter bella lite sleeve and glove- report she tried to put it on and - to tight and causing pain OT attempt to fit pt- but to tight  [employer] strong will also cause issues - because of  wrist that is to tight - causing tourniquet Pt cont to have under arm and chest lymphedema - even with doing self MLD for more than 2 wks  Pt fitted with Jovipak breast pad - unilateral breast pt to use during day and night for the next 2 wks   Contacted Rep at [facility] - pt can go be there after leaving here - to get measured for custom [employer] sleeve and glove  And PUmp  Pt will not be able to use night time compression sleeve - has to help her husband at night time too - husband has lung CA  Pt physically helping husband the last few months - partially causing her lymphedema to increase the last few months - and had COVID 2nd shot March-? Pt to cont with MLD, jovipak breast pad and temporary compression on L UE for about 2wks  While waiting for pump and custom sleeve and glove                   OT Education - 06/13/20 1137    Education Details MLD and wearing compression    Person(s) Educated Patient    Methods Explanation;Demonstration;Tactile cues;Handout;Verbal cues    Comprehension Verbalized understanding;Returned demonstration               OT Long Term Goals - 05/27/20 1157      OT LONG TERM GOAL #1   Title Pt to be indepedent in  MLD, and wearing of compression to decrease circumference in L UE by 1 cm and maintain her lymphdema    Baseline no knowledge    Time 3    Period Weeks    Status New    Target Date 06/17/20      OT LONG TERM GOAL #2   Title Pt to be fitted with correctly compression garments to maintain her lymphedema in L upper qaudrant to prevent infection    Baseline no compression, knowledge on MLD    Time 6    Period Weeks    Status New    Target Date 07/08/20                 Plan - 06/13/20 1137    Clinical Impression Statement Pt report that her L arm was always little larger - but in April it starting getting larger - and she had to help her husband that has Lung CA more - she had her 2nd COVID vaccine in March -  the over the counter compression sleeve and glove not fitting her and causing pain - and she needs to help her husband at night time - not be able to wear night compression sleeve- recommend custom [employer] soft sleeve and glove for pt - will get measure this afternoon -and pump to use 2 x day with chest piece - pt has chest and under arm lymphedema- and using jovipak breast pad she is fitted with today - she's already doing MLD for more than 2 wks - and still chest and under arm lymphedema    OT Occupational Profile and History Problem Focused Assessment - Including review of records relating to presenting problem    Occupational performance deficits (Please refer to evaluation for details): IADL's;ADL's;Leisure    Body Structure / Function / Physical Skills Edema;Strength;Pain;ADL;Decreased knowledge of precautions    Rehab Potential Good    Clinical Decision Making Limited treatment options, no task modification necessary    Modification or Assistance to Complete Evaluation  No modification of tasks or assist necessary to complete eval    OT Frequency 1x / week    OT Duration 6 weeks    OT Treatment/Interventions Self-care/ADL training;Therapeutic exercise;Patient/family education;Compression bandaging;Manual lymph drainage;Scar mobilization    Plan assess if she got her compression , and wearing correctly -and pump use if got it yet    OT Home Exercise Plan see HEP    Consulted and Agree with Plan of Care Patient           Patient will benefit from skilled therapeutic intervention in order to improve the following deficits and impairments:   Body Structure / Function / Physical Skills: Edema, Strength, Pain, ADL, Decreased knowledge of precautions       Visit Diagnosis: Postmastectomy lymphedema syndrome    Problem List Patient Active Problem List   Diagnosis Date Noted  . Breast cancer ([place]) 01/17/2019  . Goals of care, counseling/discussion 12/08/2018  . Fatty liver  disease, nonalcoholic 37/62/8315  . Heart murmur previously undiagnosed 12/15/2017  . Aortic valve sclerosis 12/15/2017  . Breast cancer, left breast ([place]) 02/18/2016  . Fecal incontinence 12/31/2014  . Irritable bowel syndrome with constipation and diarrhea 12/31/2014  . Lumbar radiculitis 06/08/2014  . DDD (degenerative disc disease), lumbar 05/21/2014  . Dyspnea on exertion 05/17/2014  . Edema 05/17/2014  . Trochanteric bursitis of left hip 04/19/2014  . Anemia 01/23/2014  . Diverticulosis 01/23/2014  . DM type 2 (diabetes mellitus, type 2) ([place]) 01/23/2014  . H/O adenomatous polyp of colon 01/23/2014  . HTN (hypertension) 01/23/2014  . Hyperlipidemia 01/23/2014  . Osteoarthritis 01/23/2014  . Other symptoms involving digestive system(787.99) 01/23/2014  . Personal history of breast cancer 01/23/2014  . Sleep apnea 01/23/2014    Rosalyn Gess OTR/l,CLT  06/13/2020, 12:38 PM  [facility] [address], [place], [address] Phone: [phone]   Fax:  [phone]  Name: Jennifer Maddox MRN: [id number] Date of Birth: Mar 16, 1946

## 2020-06-13 NOTE — Patient Instructions (Signed)
See note

## 2020-06-24 ENCOUNTER — Inpatient Hospital Stay: Payer: Medicare HMO | Attending: Oncology

## 2020-06-24 ENCOUNTER — Encounter: Payer: Self-pay | Admitting: Oncology

## 2020-06-24 ENCOUNTER — Other Ambulatory Visit: Payer: Self-pay

## 2020-06-24 ENCOUNTER — Inpatient Hospital Stay (HOSPITAL_BASED_OUTPATIENT_CLINIC_OR_DEPARTMENT_OTHER): Payer: Medicare HMO | Admitting: Oncology

## 2020-06-24 VITALS — BP 123/68 | HR 66 | Temp 97.0°F | Resp 18 | Wt 210.2 lb

## 2020-06-24 DIAGNOSIS — Z8673 Personal history of transient ischemic attack (TIA), and cerebral infarction without residual deficits: Secondary | ICD-10-CM | POA: Diagnosis not present

## 2020-06-24 DIAGNOSIS — C50912 Malignant neoplasm of unspecified site of left female breast: Secondary | ICD-10-CM | POA: Diagnosis present

## 2020-06-24 DIAGNOSIS — Z170 Estrogen receptor positive status [ER+]: Secondary | ICD-10-CM | POA: Insufficient documentation

## 2020-06-24 DIAGNOSIS — Z08 Encounter for follow-up examination after completed treatment for malignant neoplasm: Secondary | ICD-10-CM

## 2020-06-24 DIAGNOSIS — Z923 Personal history of irradiation: Secondary | ICD-10-CM | POA: Insufficient documentation

## 2020-06-24 DIAGNOSIS — Z79811 Long term (current) use of aromatase inhibitors: Secondary | ICD-10-CM | POA: Diagnosis not present

## 2020-06-24 DIAGNOSIS — Z79899 Other long term (current) drug therapy: Secondary | ICD-10-CM | POA: Diagnosis not present

## 2020-06-24 DIAGNOSIS — Z87891 Personal history of nicotine dependence: Secondary | ICD-10-CM | POA: Insufficient documentation

## 2020-06-24 DIAGNOSIS — Z853 Personal history of malignant neoplasm of breast: Secondary | ICD-10-CM

## 2020-06-24 DIAGNOSIS — I890 Lymphedema, not elsewhere classified: Secondary | ICD-10-CM | POA: Diagnosis not present

## 2020-06-24 DIAGNOSIS — Z808 Family history of malignant neoplasm of other organs or systems: Secondary | ICD-10-CM | POA: Diagnosis not present

## 2020-06-24 DIAGNOSIS — Z801 Family history of malignant neoplasm of trachea, bronchus and lung: Secondary | ICD-10-CM | POA: Diagnosis not present

## 2020-06-24 DIAGNOSIS — Z8249 Family history of ischemic heart disease and other diseases of the circulatory system: Secondary | ICD-10-CM | POA: Insufficient documentation

## 2020-06-24 DIAGNOSIS — Z5181 Encounter for therapeutic drug level monitoring: Secondary | ICD-10-CM

## 2020-06-24 DIAGNOSIS — Z8042 Family history of malignant neoplasm of prostate: Secondary | ICD-10-CM | POA: Insufficient documentation

## 2020-06-24 LAB — CBC WITH DIFFERENTIAL/PLATELET
Abs Immature Granulocytes: 0.03 10*3/uL (ref 0.00–0.07)
Basophils Absolute: 0 10*3/uL (ref 0.0–0.1)
Basophils Relative: 0 %
Eosinophils Absolute: 0.2 10*3/uL (ref 0.0–0.5)
Eosinophils Relative: 2 %
HCT: 40.9 % (ref 36.0–46.0)
Hemoglobin: 13.9 g/dL (ref 12.0–15.0)
Immature Granulocytes: 1 %
Lymphocytes Relative: 24 %
Lymphs Abs: 1.5 10*3/uL (ref 0.7–4.0)
MCH: 30.3 pg (ref 26.0–34.0)
MCHC: 34 g/dL (ref 30.0–36.0)
MCV: 89.3 fL (ref 80.0–100.0)
Monocytes Absolute: 0.6 10*3/uL (ref 0.1–1.0)
Monocytes Relative: 10 %
Neutro Abs: 4 10*3/uL (ref 1.7–7.7)
Neutrophils Relative %: 63 %
Platelets: 264 10*3/uL (ref 150–400)
RBC: 4.58 MIL/uL (ref 3.87–5.11)
RDW: 13.9 % (ref 11.5–15.5)
WBC: 6.3 10*3/uL (ref 4.0–10.5)
nRBC: 0 % (ref 0.0–0.2)

## 2020-06-24 LAB — COMPREHENSIVE METABOLIC PANEL
ALT: 23 U/L (ref 0–44)
AST: 18 U/L (ref 15–41)
Albumin: 4.5 g/dL (ref 3.5–5.0)
Alkaline Phosphatase: 58 U/L (ref 38–126)
Anion gap: 11 (ref 5–15)
BUN: 18 mg/dL (ref 8–23)
CO2: 32 mmol/L (ref 22–32)
Calcium: 9.5 mg/dL (ref 8.9–10.3)
Chloride: 97 mmol/L — ABNORMAL LOW (ref 98–111)
Creatinine, Ser: 0.97 mg/dL (ref 0.44–1.00)
GFR calc Af Amer: >60 mL/min (ref >60)
GFR calc non Af Amer: 57 mL/min — ABNORMAL LOW (ref >60)
Glucose, Bld: 166 mg/dL — ABNORMAL HIGH (ref 70–99)
Potassium: 3.2 mmol/L — ABNORMAL LOW (ref 3.5–5.1)
Sodium: 140 mmol/L (ref 135–145)
Total Bilirubin: 0.7 mg/dL (ref 0.3–1.2)
Total Protein: 7.2 g/dL (ref 6.5–8.1)

## 2020-06-24 NOTE — Progress Notes (Signed)
Pt in for follow up, reports been seeing lymphedema therapist for a few weeks and usually wears a sleeve to help with swelling.

## 2020-07-01 NOTE — Progress Notes (Signed)
Hematology/Oncology Consult note [facility]  [phone] [phone]  Patient Care Team: Maryland Pink, MD as PCP - General (Family Medicine)   Name of the patient: Jennifer Maddox  [id number]  [birth year]   Date of visit: 07/01/20  Diagnosis- invasive mammary carcinoma of the left breast pathological prognostic stage IApT1 ppN0 cM0 ER PR positive and HER-2/neu negative status post mastectomy   Chief complaint/ Reason for visit-routine follow-up of breast cancer on Arimidex  Heme/Onc history: patient is a 74 year old female who was diagnosed with stage I breast cancer in 2011. It was a T1b N0 M0 stage I tumor s/p lumpectomy. Oncotype DX score was 7 and she did not require adjuvant chemotherapy. She did complete adjuvant radiation and 5 years of hormone therapy.Mammogram and ultrasound on 11/24/2018 showed irregular hypoechoic mass in the left breast measuring 5 x 4 x 4 mm. No enlarged or morphologically abnormal lymph nodes in the left axilla. Patient underwent biopsy which showed invasive mammary carcinoma, grade 2, ER greater than 90% positive, PR greater than 90% positive. HER-2 was equivocal by IHC.FISH testingnegative  Patient underwent left mastectomy on 12/01/2018. Final pathology showed 52m, grade 2 invasive mammary carcinoma with negative margins. ER PR positive and HER-2/neu negative.3 sentinel and one non-sentinel lymph node was negative for malignancy.Oncotype testing came back with intermediate risk score of 18 and given her age patient does not require any adjuvant chemotherapy.She did not require postmastectomy radiation and is currently on Arimidex since March 2020.  Interval history-currently patient reports doing well and tolerating Arimidex without any significant side effects.  She does have chronic lymphedema from her recent surgery and is seeing a therapist for the same.She has gained 6 pounds since August  2020.  Denies any new aches or pains anywhere  ECOG PS- 1 Pain scale- 0  Review of systems- Review of Systems  Constitutional: Positive for malaise/fatigue. Negative for chills, fever and weight loss.  HENT: Negative for congestion, ear discharge and nosebleeds.   Eyes: Negative for blurred vision.  Respiratory: Negative for cough, hemoptysis, sputum production, shortness of breath and wheezing.   Cardiovascular: Negative for chest pain, palpitations, orthopnea and claudication.  Gastrointestinal: Negative for abdominal pain, blood in stool, constipation, diarrhea, heartburn, melena, nausea and vomiting.  Genitourinary: Negative for dysuria, flank pain, frequency, hematuria and urgency.  Musculoskeletal: Negative for back pain, joint pain and myalgias.  Skin: Negative for rash.  Neurological: Negative for dizziness, tingling, focal weakness, seizures, weakness and headaches.  Endo/Heme/Allergies: Does not bruise/bleed easily.  Psychiatric/Behavioral: Negative for depression and suicidal ideas. The patient does not have insomnia.       Allergies  Allergen Reactions  . Atorvastatin Other (See Comments)    Cramps  . Naproxen Sodium Other (See Comments)    Head and Neck Puritis     Past Medical History:  Diagnosis Date  . Anemia   . Arthritis   . Breast cancer ([facility] 2011/2019   left breast  . Breast cancer, left breast ([facility] 2011  . COPD (chronic obstructive pulmonary disease) ([facility]   . Degenerative disc disease, lumbar   . Diabetes mellitus without complication ([facility]   . Diverticulosis   . Hyperlipidemia   . Hypertension   . Personal history of radiation therapy   . Sleep apnea   . TIA (transient ischemic attack)      Past Surgical History:  Procedure Laterality Date  . ABDOMINAL HYSTERECTOMY     around age 74; ovaries intact  .  BREAST BIOPSY Left 12/01/2018   Pathology LEFT breast mass 11 o'clock location: Invasive mammary  . BREAST EXCISIONAL BIOPSY Left 2011    breast ca with radation  . CHOLECYSTECTOMY    . COLONOSCOPY W/ POLYPECTOMY    . COLONOSCOPY WITH PROPOFOL N/A 10/29/2017   Procedure: COLONOSCOPY WITH PROPOFOL;  Surgeon: Manya Silvas, MD;  Location: [facility] ENDOSCOPY;  Service: Endoscopy;  Laterality: N/A;  . ESOPHAGOGASTRODUODENOSCOPY (EGD) WITH PROPOFOL N/A 10/29/2017   Procedure: ESOPHAGOGASTRODUODENOSCOPY (EGD) WITH PROPOFOL;  Surgeon: Manya Silvas, MD;  Location: [facility] ENDOSCOPY;  Service: Endoscopy;  Laterality: N/A;  . KNEE ARTHROSCOPY W/ MENISCAL REPAIR Right   . MASTECTOMY Left 2019  . SENTINEL NODE BIOPSY Left 01/17/2019   Procedure: SENTINEL NODE INJECTION;  Surgeon: Benjamine Sprague, DO;  Location: [facility] ORS;  Service: General;  Laterality: Left;  . TONSILLECTOMY    . TOTAL MASTECTOMY Left 01/17/2019   Procedure: TOTAL MASTECTOMY;  Surgeon: Benjamine Sprague, DO;  Location: [facility] ORS;  Service: General;  Laterality: Left;    Social History   Socioeconomic History  . Marital status: Married    Spouse name: Not on file  . Number of children: Not on file  . Years of education: Not on file  . Highest education level: Not on file  Occupational History  . Not on file  Tobacco Use  . Smoking status: Former Smoker    Quit date: 12/14/1977    Years since quitting: 42.5  . Smokeless tobacco: Never Used  Vaping Use  . Vaping Use: Never used  Substance and Sexual Activity  . Alcohol use: No  . Drug use: No  . Sexual activity: Not on file  Other Topics Concern  . Not on file  Social History Narrative  . Not on file   Social Determinants of Health   Financial Resource Strain:   . Difficulty of Paying Living Expenses:   Food Insecurity:   . Worried About Charity fundraiser in the Last Year:   . Arboriculturist in the Last Year:   Transportation Needs:   . Film/video editor (Medical):   Marland Kitchen Lack of Transportation (Non-Medical):   Physical Activity:   . Days of Exercise per Week:   . Minutes of Exercise per Session:     Stress:   . Feeling of Stress :   Social Connections:   . Frequency of Communication with Friends and Family:   . Frequency of Social Gatherings with Friends and Family:   . Attends Religious Services:   . Active Member of Clubs or Organizations:   . Attends Archivist Meetings:   Marland Kitchen Marital Status:   Intimate Partner Violence:   . Fear of Current or Ex-Partner:   . Emotionally Abused:   Marland Kitchen Physically Abused:   . Sexually Abused:     Family History  Problem Relation Age of Onset  . Lung cancer Mother        smoker; deceased 64  . Heart attack Father        deceased 19  . Melanoma Brother 56       currently 33  . Prostate cancer Brother 43       currently 90  . Melanoma Son 45       below left armpit; currently 40     Current Outpatient Medications:  .  amLODipine (NORVASC) 2.5 MG tablet, Take 2.5 mg by mouth daily., Disp: , Rfl:  .  anastrozole (ARIMIDEX) 1 MG tablet, TAKE  1 TABLET BY MOUTH EVERY DAY, Disp: 90 tablet, Rfl: 1 .  aspirin EC 81 MG tablet, Take 81 mg by mouth daily., Disp: , Rfl:  .  blood glucose meter kit and supplies, USE TWICE DAILY, Disp: , Rfl:  .  Calcium Carbonate-Vitamin D (CALCIUM 500/D PO), Take 1 tablet by mouth 2 (two) times a day., Disp: , Rfl:  .  clobetasol ointment (TEMOVATE) 8.56 %, Apply 1 application topically daily as needed (itching). , Disp: , Rfl: 1 .  furosemide (LASIX) 20 MG tablet, Take 20 mg by mouth 2 (two) times daily. , Disp: , Rfl:  .  gabapentin (NEURONTIN) 300 MG capsule, Take 300 mg by mouth at bedtime. , Disp: , Rfl:  .  glipiZIDE (GLUCOTROL XL) 2.5 MG 24 hr tablet, Take 1 tablet by mouth daily., Disp: , Rfl:  .  glucose blood test strip, , Disp: , Rfl:  .  ibuprofen (ADVIL,MOTRIN) 800 MG tablet, Take 1 tablet (800 mg total) by mouth every 8 (eight) hours as needed for mild pain or moderate pain., Disp: 30 tablet, Rfl: 0 .  losartan-hydrochlorothiazide (HYZAAR) 100-12.5 MG tablet, Take 1 tablet by mouth daily.,  Disp: , Rfl:  .  MELATONIN PO, Take 12 mg by mouth at bedtime. , Disp: , Rfl:  .  Multiple Vitamins-Minerals (MULTIVITAMIN ADULT PO), Take 1 tablet by mouth daily. , Disp: , Rfl:  .  Polyethylene Glycol 400 (BLINK TEARS OP), Apply 1 drop to eye daily as needed (dry eyes)., Disp: , Rfl:  .  pravastatin (PRAVACHOL) 40 MG tablet, Take 40 mg by mouth daily. , Disp: , Rfl:  .  traZODone (DESYREL) 100 MG tablet, Take 150 mg by mouth at bedtime. , Disp: , Rfl:  .  Probiotic Product ([employer] PROBIOTIC) CHEW, Chew 500 mg by mouth daily at 6 (six) AM. (Patient not taking: Reported on 05/27/2020), Disp: , Rfl:  .  vitamin B-12 (CYANOCOBALAMIN) 1000 MCG tablet, Take 2,000 mcg by mouth daily.  (Patient not taking: Reported on 05/27/2020), Disp: , Rfl:   Physical exam:  Vitals:   06/24/20 1327  BP: 123/68  Pulse: 66  Resp: 18  Temp: (!) 97 F (36.1 C)  TempSrc: Tympanic  Weight: 210 lb 3.2 oz (95.3 kg)   Physical Exam Constitutional:      General: She is not in acute distress. Cardiovascular:     Rate and Rhythm: Normal rate and regular rhythm.     Heart sounds: Normal heart sounds.  Pulmonary:     Effort: Pulmonary effort is normal.     Breath sounds: Normal breath sounds.  Abdominal:     General: Bowel sounds are normal.     Palpations: Abdomen is soft.  Skin:    General: Skin is warm and dry.  Neurological:     Mental Status: She is alert and oriented to person, place, and time.   Breast exam: Patient is s/p left mastectomy without reconstruction.  No evidence of chest wall recurrence.  No palpable bilateral axillary adenopathy.  No palpable masses in the right breast  CMP Latest Ref Rng & Units 06/24/2020  Glucose 70 - 99 mg/dL 166(H)  BUN 8 - 23 mg/dL 18  Creatinine 0.44 - 1.00 mg/dL 0.97  Sodium 135 - 145 mmol/L 140  Potassium 3.5 - 5.1 mmol/L 3.2(L)  Chloride 98 - 111 mmol/L 97(L)  CO2 22 - 32 mmol/L 32  Calcium 8.9 - 10.3 mg/dL 9.5  Total Protein 6.5 - 8.1 g/dL 7.2  Total  Bilirubin 0.3 - 1.2 mg/dL 0.7  Alkaline Phos 38 - 126 U/L 58  AST 15 - 41 U/L 18  ALT 0 - 44 U/L 23   CBC Latest Ref Rng & Units 06/24/2020  WBC 4.0 - 10.5 K/uL 6.3  Hemoglobin 12.0 - 15.0 g/dL 13.9  Hematocrit 36 - 46 % 40.9  Platelets 150 - 400 K/uL 264      Assessment and plan- Patient is a 74 y.o. female with history of left breast cancer ER PR positive HER-2 negative currently on Arimidex here for routine follow-up  Clinically patient is doing well with no concerning signs and symptoms of recurrence based on today's exam.  Recent mammogram from December 2020 revealed no evidence of malignancy in the right breast.  I will see her back in 6 months for routine visit.  She will continue Arimidex along with calcium and vitamin D.   Visit Diagnosis 1. Visit for monitoring Arimidex therapy   2. Encounter for follow-up surveillance of breast cancer      Dr. Randa Evens, MD, MPH [facility] [phone] 07/01/2020 12:26 PM

## 2020-07-02 ENCOUNTER — Ambulatory Visit: Payer: Medicare HMO | Admitting: Occupational Therapy

## 2020-07-02 ENCOUNTER — Other Ambulatory Visit: Payer: Self-pay

## 2020-07-02 DIAGNOSIS — I972 Postmastectomy lymphedema syndrome: Secondary | ICD-10-CM | POA: Diagnosis not present

## 2020-07-02 NOTE — Therapy (Signed)
[facility] [address], [place], [address] Phone: [phone]   Fax:  [phone]  Occupational Therapy Treatment  Patient Details  Name: Jennifer Maddox MRN: [id number] Date of Birth: 1946/12/07 Referring Provider (OT): Dr Janese Banks   Encounter Date: 07/02/2020   OT End of Session - 07/02/20 1133    Visit Number 4    Number of Visits 6    Date for OT Re-Evaluation 07/08/20    OT Start Time 1046    OT Stop Time 1110    OT Time Calculation (min) 24 min    Activity Tolerance Patient tolerated treatment well           Past Medical History:  Diagnosis Date  . Anemia   . Arthritis   . Breast cancer ([place]) 2011/2019   left breast  . Breast cancer, left breast ([place]) 2011  . COPD (chronic obstructive pulmonary disease) ([place])   . Degenerative disc disease, lumbar   . Diabetes mellitus without complication ([place])   . Diverticulosis   . Hyperlipidemia   . Hypertension   . Personal history of radiation therapy   . Sleep apnea   . TIA (transient ischemic attack)     Past Surgical History:  Procedure Laterality Date  . ABDOMINAL HYSTERECTOMY     around age 67 ; ovaries intact  . BREAST BIOPSY Left 12/01/2018   Pathology LEFT breast mass 11 o'clock location: Invasive mammary  . BREAST EXCISIONAL BIOPSY Left 2011   breast ca with radation  . CHOLECYSTECTOMY    . COLONOSCOPY W/ POLYPECTOMY    . COLONOSCOPY WITH PROPOFOL N/A 10/29/2017   Procedure: COLONOSCOPY WITH PROPOFOL;  Surgeon: Manya Silvas, MD;  Location: [facility] ENDOSCOPY;  Service: Endoscopy;  Laterality: N/A;  . ESOPHAGOGASTRODUODENOSCOPY (EGD) WITH PROPOFOL N/A 10/29/2017   Procedure: ESOPHAGOGASTRODUODENOSCOPY (EGD) WITH PROPOFOL;  Surgeon: Manya Silvas, MD;  Location: [facility] ENDOSCOPY;  Service: Endoscopy;  Laterality: N/A;  . KNEE ARTHROSCOPY W/ MENISCAL REPAIR Right   . MASTECTOMY Left 2019  . SENTINEL NODE BIOPSY Left 01/17/2019    Procedure: SENTINEL NODE INJECTION;  Surgeon: Benjamine Sprague, DO;  Location: [facility] ORS;  Service: General;  Laterality: Left;  . TONSILLECTOMY    . TOTAL MASTECTOMY Left 01/17/2019   Procedure: TOTAL MASTECTOMY;  Surgeon: Benjamine Sprague, DO;  Location: [facility] ORS;  Service: General;  Laterality: Left;    There were no vitals filed for this visit.   Subjective Assessment - 07/02/20 1131    Subjective  I did not hear anything from them about my sleeve or pump yet - I have nurse helping once a week with my husband and have hospital bed - he is little better maybe now that chemo stopped    Pertinent History Pt had L breast CA with lumpectomy and radiation 2011 , and then 12/19 L breast CA return, had early last year L mastectomy - appear 3 ln removed -and on Arimedex March 2020 - refer to OT for L UE and thoracic lymphedema    Patient Stated Goals Do not want my lymphedema or swelling in my  arm to get worse    Currently in Pain? No/denies               LYMPHEDEMA/ONCOLOGY QUESTIONNAIRE - 07/02/20 0001      Left Upper Extremity Lymphedema   15 cm Proximal to Olecranon Process 41.8 cm    10 cm Proximal to Olecranon Process 40 cm  Olecranon Process 29.8 cm    15 cm Proximal to Ulnar Styloid Process 27 cm    10 cm Proximal to Ulnar Styloid Process 24 cm    Just Proximal to Ulnar Styloid Process 17 cm    Across Hand at [employer] 18.5 cm    At [place] of 2nd Digit 7 cm    At [facility] of Thumb 6.6 cm             Pt arrive with her temporary compression on L UE - isotoner glove,  tubigrip D to elbow and tubigrip E from mid forearm to axilla- pt's circumference did decrease in hand to forearm but elbow and upper arm still increase by 2.3 to 2.7 cm, elbow increase 2.8 cm  - forearm still increase about 1 cm  Compare to R UE  Pt busy with helping husband that has CA too - and need physical help -did get hospital bed for him and nurse comes once week    She had Bella strong sleeve but was causing  issues  - because of wrist that is to tight - causing tourniquet Was send to [place] on July 1st to get measured for custom [employer] el[employer] sleeve and glove -as well as pump  She cont to have under arm and chest lymphedema - even with doing self MLD for more than 3-5  wks  Pt fitted with Jovipak breast pad about 3 wks ago  - unilateral breast pt to use during day and night for the next 2 wks as needed - reinforce importance to wear to clear thoracic lymphedema  Contacted Rep at [facility] and left message on progress on sleeve /glove and pump  To contact  Pt and let her know - this OT will be out of office rest of week - but pt to contact me next week on update   -  Pt will not be able to use night time compression sleeve - has to help her husband at night time too - husband has lung CA  Pt physically helping husband the last few months  And doing most every day tasks around the house - partially causing her lymphedema to increase the last few months - and had COVID 2nd shot March-? Pt to cont with MLD, jovipak breast pad and temporary compression on L UE until she gets pump and custom sleeve/glove                OT Education - 07/02/20 1132    Education Details wearing of coompression    Person(s) Educated Patient    Methods Explanation;Demonstration;Tactile cues;Handout;Verbal cues    Comprehension Verbalized understanding;Returned demonstration               OT Long Term Goals - 05/27/20 1157      OT LONG TERM GOAL #1   Title Pt to be indepedent in  MLD, and wearing of compression to decrease circumference in L UE by 1 cm and maintain her lymphdema    Baseline no knowledge    Time 3    Period Weeks    Status New    Target Date 06/17/20      OT LONG TERM GOAL #2   Title Pt to be fitted with correctly compression garments to maintain her lymphedema in L upper qaudrant to prevent infection    Baseline no compression, knowledge on MLD    Time 6    Period  Weeks    Status New  Target Date 07/08/20                 Plan - 07/02/20 1133    Clinical Impression Statement Pt present this date with wearing still her temporary compression on her L UE - did not get call from Rep about pump and custom compression sleeve and glove - left message with [facility] to contact pt - her upper arm still increase by 2.7 cm and elbow 2.8 cm , forearm 1 cm - pt to call me for appt when she pick up her custom sleeve and get her pump    OT Occupational Profile and History Problem Focused Assessment - Including review of records relating to presenting problem    Occupational performance deficits (Please refer to evaluation for details): IADL's;ADL's;Leisure    Body Structure / Function / Physical Skills Edema;Strength;Pain;ADL;Decreased knowledge of precautions    Rehab Potential Good    Clinical Decision Making Limited treatment options, no task modification necessary    Modification or Assistance to Complete Evaluation  No modification of tasks or assist necessary to complete eval    OT Frequency 1x / week    OT Duration --   1 wks   OT Treatment/Interventions Self-care/ADL training;Therapeutic exercise;Patient/family education;Compression bandaging;Manual lymph drainage;Scar mobilization    Plan assess if she got her compression , and wearing correctly -and pump use if got it yet    OT Home Exercise Plan see HEP    Consulted and Agree with Plan of Care Patient           Patient will benefit from skilled therapeutic intervention in order to improve the following deficits and impairments:   Body Structure / Function / Physical Skills: Edema, Strength, Pain, ADL, Decreased knowledge of precautions       Visit Diagnosis: Postmastectomy lymphedema syndrome    Problem List Patient Active Problem List   Diagnosis Date Noted  . Breast cancer ([place]) 01/17/2019  . Goals of care, counseling/discussion 12/08/2018  . Fatty liver disease, nonalcoholic  29/47/6546  . Heart murmur previously undiagnosed 12/15/2017  . Aortic valve sclerosis 12/15/2017  . Breast cancer, left breast ([place]) 02/18/2016  . Fecal incontinence 12/31/2014  . Irritable bowel syndrome with constipation and diarrhea 12/31/2014  . Lumbar radiculitis 06/08/2014  . DDD (degenerative disc disease), lumbar 05/21/2014  . Dyspnea on exertion 05/17/2014  . Edema 05/17/2014  . Trochanteric bursitis of left hip 04/19/2014  . Anemia 01/23/2014  . Diverticulosis 01/23/2014  . DM type 2 (diabetes mellitus, type 2) ([place]) 01/23/2014  . H/O adenomatous polyp of colon 01/23/2014  . HTN (hypertension) 01/23/2014  . Hyperlipidemia 01/23/2014  . Osteoarthritis 01/23/2014  . Other symptoms involving digestive system(787.99) 01/23/2014  . Personal history of breast cancer 01/23/2014  . Sleep apnea 01/23/2014    Rosalyn Gess OTR/L,CLT 07/02/2020, 11:37 AM  [facility] [address] [place], [address] Phone: [phone]   Fax:  [phone]  Name: Jennifer Maddox MRN: [id number] Date of Birth: [birth year]

## 2020-07-15 ENCOUNTER — Ambulatory Visit: Payer: Medicare HMO | Attending: Oncology | Admitting: Occupational Therapy

## 2020-07-15 ENCOUNTER — Other Ambulatory Visit: Payer: Self-pay

## 2020-07-15 DIAGNOSIS — I972 Postmastectomy lymphedema syndrome: Secondary | ICD-10-CM | POA: Diagnosis not present

## 2020-07-15 NOTE — Therapy (Signed)
[place] [facility] [address] [place], [address] Phone: [phone]   Fax:  [phone]  Occupational Therapy Treatment  Patient Details  Name: Jennifer Maddox MRN: [id number] Date of Birth: [birth year] Referring Provider (OT): Dr Janese Banks   Encounter Date: 07/15/2020   OT End of Session - 07/15/20 1204    Visit Number 5    Number of Visits 8    Date for OT Re-Evaluation 08/19/20    OT Start Time 1116    OT Stop Time 1136    OT Time Calculation (min) 20 min    Activity Tolerance Patient tolerated treatment well    Behavior During Therapy [facility] for tasks assessed/performed           Past Medical History:  Diagnosis Date  . Anemia   . Arthritis   . Breast cancer ([place]) 2011/2019   left breast  . Breast cancer, left breast ([place]) 2011  . COPD (chronic obstructive pulmonary disease) ([place])   . Degenerative disc disease, lumbar   . Diabetes mellitus without complication ([place])   . Diverticulosis   . Hyperlipidemia   . Hypertension   . Personal history of radiation therapy   . Sleep apnea   . TIA (transient ischemic attack)     Past Surgical History:  Procedure Laterality Date  . ABDOMINAL HYSTERECTOMY     around age 1 ; ovaries intact  . BREAST BIOPSY Left 12/01/2018   Pathology LEFT breast mass 11 o'clock location: Invasive mammary  . BREAST EXCISIONAL BIOPSY Left 2011   breast ca with radation  . CHOLECYSTECTOMY    . COLONOSCOPY W/ POLYPECTOMY    . COLONOSCOPY WITH PROPOFOL N/A 10/29/2017   Procedure: COLONOSCOPY WITH PROPOFOL;  Surgeon: Manya Silvas, MD;  Location: [facility] ENDOSCOPY;  Service: Endoscopy;  Laterality: N/A;  . ESOPHAGOGASTRODUODENOSCOPY (EGD) WITH PROPOFOL N/A 10/29/2017   Procedure: ESOPHAGOGASTRODUODENOSCOPY (EGD) WITH PROPOFOL;  Surgeon: Manya Silvas, MD;  Location: [facility] ENDOSCOPY;  Service: Endoscopy;  Laterality: N/A;  . KNEE ARTHROSCOPY W/ MENISCAL REPAIR Right   . MASTECTOMY  Left 2019  . SENTINEL NODE BIOPSY Left 01/17/2019   Procedure: SENTINEL NODE INJECTION;  Surgeon: Benjamine Sprague, DO;  Location: [facility] ORS;  Service: General;  Laterality: Left;  . TONSILLECTOMY    . TOTAL MASTECTOMY Left 01/17/2019   Procedure: TOTAL MASTECTOMY;  Surgeon: Benjamine Sprague, DO;  Location: [facility] ORS;  Service: General;  Laterality: Left;    There were no vitals filed for this visit.   Subjective Assessment - 07/15/20 1203    Subjective  I am doing okay with the new sleeve- I lay down when putting it on - trying to get it up as high as possible to elbow - glove maybe little tight- did not hear about the pump yet    Pertinent History Pt had L breast CA with lumpectomy and radiation 2011 , and then 12/19 L breast CA return, had early last year L mastectomy - appear 3 ln removed -and on Arimedex March 2020 - refer to OT for L UE and thoracic lymphedema    Patient Stated Goals Do not want my lymphedema or swelling in my  arm to get worse    Currently in Pain? No/denies               LYMPHEDEMA/ONCOLOGY QUESTIONNAIRE - 07/15/20 0001      Left Upper Extremity Lymphedema   15 cm Proximal to Olecranon Process 40.5 cm  10 cm Proximal to Olecranon Process 39.5 cm    Olecranon Process 29.3 cm    15 cm Proximal to Ulnar Styloid Process 27.4 cm    10 cm Proximal to Ulnar Styloid Process 24.5 cm    Just Proximal to Ulnar Styloid Process 16.8 cm    Across Hand at [employer] 19 cm    At [place] of 2nd Digit 7 cm    At [facility] of Thumb 6.6 cm                Pt arrive her custom [employer] soft sleeve and glove - appear to fit well   pt to make sure she pulls up high enough  Pt was increase at elbow  and upper arm last time by  2.3 to 2.8 cm   Compare to R UE  But this date now increase only 2.3 at elbow/ upper arm -and forearm 1.5 cm  Pt busy with helping husband that has CA too - and need physical help -did get hospital bed for him and nurse comes once week   She cont to  have under arm and chest lymphedema - even with doing self MLD for more than 6 wks  Pt fitted with Jovipak breast pad at [facility] wks ago  - unilateral breast pt to use during day and night for the next 2 wks as needed - reinforce importance to wear to clear thoracic lymphedema  Contacted Rep at [employer]  waiting for pump  Authorization for MD per REP   -  Pt will not be able to use night time compression sleeve - has to help her husband at night time too - husband has lung CA  Pt physically helping husband the last few months  And doing most every day tasks around the house - partially causing her lymphedema to increase the last few months - and had COVID 2nd shot March-? Pt to cont with MLD, jovipak breast pad and temporary compression on L UE until she gets pump and custom sleeve/glove               OT Education - 07/15/20 1204    Education Details wearing of coompression               OT Long Term Goals - 07/15/20 1208      OT LONG TERM GOAL #1   Title Pt to be indepedent in  MLD, and wearing of compression to decrease circumference in L UE by 1 cm and maintain her lymphdema    Status Achieved      OT LONG TERM GOAL #2   Title Pt to be fitted with correctly compression garments to maintain her lymphedema in L upper qaudrant to prevent infection    Baseline but pt not wearing consistantly - husband has lung CA - cannot always help with jovipak - await pump    Time 5    Period Weeks    Status On-going    Target Date 08/19/20                 Plan - 07/15/20 1205    Clinical Impression Statement Pt present with her [employer] soft custome sleev and glove- doiing well - measurements decrease - but stil increase 2.3 at upper arm and elbow, forearm increase 1.5- encourage for pt to wear her jovipak breast pad to clear thoracic lymphedema- and then called the Rep - waiting for authroization from MD - will contat  today    OT Occupational Profile and History  Problem Focused Assessment - Including review of records relating to presenting problem    Occupational performance deficits (Please refer to evaluation for details): IADL's;ADL's;Leisure    Body Structure / Function / Physical Skills Edema;Strength;Pain;ADL;Decreased knowledge of precautions    Rehab Potential Good    Clinical Decision Making Limited treatment options, no task modification necessary    Comorbidities Affecting Occupational Performance: None    Modification or Assistance to Complete Evaluation  No modification of tasks or assist necessary to complete eval    OT Frequency --   1 x wk for 1 wk and then biweekly   OT Duration --   5 wks   OT Treatment/Interventions Self-care/ADL training;Therapeutic exercise;Patient/family education;Compression bandaging;Manual lymph drainage;Scar mobilization    Plan assess if she got her compression , and wearing correctly -and pump use if got it yet    OT Home Exercise Plan see HEP    Consulted and Agree with Plan of Care Patient           Patient will benefit from skilled therapeutic intervention in order to improve the following deficits and impairments:   Body Structure / Function / Physical Skills: Edema, Strength, Pain, ADL, Decreased knowledge of precautions       Visit Diagnosis: Postmastectomy lymphedema syndrome    Problem List Patient Active Problem List   Diagnosis Date Noted  . Breast cancer ([place]) 01/17/2019  . Goals of care, counseling/discussion 12/08/2018  . Fatty liver disease, nonalcoholic 22/01/5426  . Heart murmur previously undiagnosed 12/15/2017  . Aortic valve sclerosis 12/15/2017  . Breast cancer, left breast ([place]) 02/18/2016  . Fecal incontinence 12/31/2014  . Irritable bowel syndrome with constipation and diarrhea 12/31/2014  . Lumbar radiculitis 06/08/2014  . DDD (degenerative disc disease), lumbar 05/21/2014  . Dyspnea on exertion 05/17/2014  . Edema 05/17/2014  . Trochanteric bursitis of left hip  04/19/2014  . Anemia 01/23/2014  . Diverticulosis 01/23/2014  . DM type 2 (diabetes mellitus, type 2) ([place]) 01/23/2014  . H/O adenomatous polyp of colon 01/23/2014  . HTN (hypertension) 01/23/2014  . Hyperlipidemia 01/23/2014  . Osteoarthritis 01/23/2014  . Other symptoms involving digestive system(787.99) 01/23/2014  . Personal history of breast cancer 01/23/2014  . Sleep apnea 01/23/2014    Rosalyn Gess OTR/L,CLT 07/15/2020, 12:10 PM  [place] [facility] [address], [place], [address] Phone: [phone]   Fax:  [phone]  Name: Jennifer Maddox MRN: [id number] Date of Birth: 1946-06-25

## 2020-07-17 ENCOUNTER — Telehealth: Payer: Self-pay | Admitting: *Deleted

## 2020-07-17 NOTE — Telephone Encounter (Addendum)
Corene Cornea called asking if orders were able to be signed yet. Please return his call [phone]

## 2020-07-29 ENCOUNTER — Other Ambulatory Visit: Payer: Self-pay

## 2020-07-29 ENCOUNTER — Ambulatory Visit: Payer: Medicare HMO | Admitting: Occupational Therapy

## 2020-07-29 DIAGNOSIS — I972 Postmastectomy lymphedema syndrome: Secondary | ICD-10-CM | POA: Diagnosis not present

## 2020-07-29 NOTE — Therapy (Signed)
[place] [facility] [address] [place], [address] Phone: [phone]   Fax:  [phone]  Occupational Therapy Treatment  Patient Details  Name: Jennifer Maddox MRN: [id number] Date of Birth: 10/26/1946 Referring Provider (OT): Dr Janese Banks   Encounter Date: 07/29/2020   OT End of Session - 07/29/20 1547    Visit Number 6    Number of Visits 8    Date for OT Re-Evaluation 08/19/20    OT Start Time 1524    OT Stop Time 1545    OT Time Calculation (min) 21 min    Activity Tolerance Patient tolerated treatment well    Behavior During Therapy [facility] for tasks assessed/performed           Past Medical History:  Diagnosis Date  . Anemia   . Arthritis   . Breast cancer ([place]) 2011/2019   left breast  . Breast cancer, left breast ([place]) 2011  . COPD (chronic obstructive pulmonary disease) ([place])   . Degenerative disc disease, lumbar   . Diabetes mellitus without complication ([place])   . Diverticulosis   . Hyperlipidemia   . Hypertension   . Personal history of radiation therapy   . Sleep apnea   . TIA (transient ischemic attack)     Past Surgical History:  Procedure Laterality Date  . ABDOMINAL HYSTERECTOMY     around age 84 ; ovaries intact  . BREAST BIOPSY Left 12/01/2018   Pathology LEFT breast mass 11 o'clock location: Invasive mammary  . BREAST EXCISIONAL BIOPSY Left 2011   breast ca with radation  . CHOLECYSTECTOMY    . COLONOSCOPY W/ POLYPECTOMY    . COLONOSCOPY WITH PROPOFOL N/A 10/29/2017   Procedure: COLONOSCOPY WITH PROPOFOL;  Surgeon: Manya Silvas, MD;  Location: [facility] ENDOSCOPY;  Service: Endoscopy;  Laterality: N/A;  . ESOPHAGOGASTRODUODENOSCOPY (EGD) WITH PROPOFOL N/A 10/29/2017   Procedure: ESOPHAGOGASTRODUODENOSCOPY (EGD) WITH PROPOFOL;  Surgeon: Manya Silvas, MD;  Location: [facility] ENDOSCOPY;  Service: Endoscopy;  Laterality: N/A;  . KNEE ARTHROSCOPY W/ MENISCAL REPAIR Right   . MASTECTOMY  Left 2019  . SENTINEL NODE BIOPSY Left 01/17/2019   Procedure: SENTINEL NODE INJECTION;  Surgeon: Benjamine Sprague, DO;  Location: [facility] ORS;  Service: General;  Laterality: Left;  . TONSILLECTOMY    . TOTAL MASTECTOMY Left 01/17/2019   Procedure: TOTAL MASTECTOMY;  Surgeon: Benjamine Sprague, DO;  Location: [facility] ORS;  Service: General;  Laterality: Left;    There were no vitals filed for this visit.   Subjective Assessment - 07/29/20 1545    Subjective  They fitted me with my pump last week -and doing well - using it about 5 out 7 days - 2 x day -my arm feels softer - still hard to get sleeve all the way up - but doing better    Pertinent History Pt had L breast CA with lumpectomy and radiation 2011 , and then 12/19 L breast CA return, had early last year L mastectomy - appear 3 ln removed -and on Arimedex March 2020 - refer to OT for L UE and thoracic lymphedema    Patient Stated Goals Do not want my lymphedema or swelling in my  arm to get worse               LYMPHEDEMA/ONCOLOGY QUESTIONNAIRE - 07/29/20 0001      Left Upper Extremity Lymphedema   15 cm Proximal to Olecranon Process 40.8 cm    10 cm Proximal to  Olecranon Process 39.5 cm    Olecranon Process 29 cm    15 cm Proximal to Ulnar Styloid Process 27.3 cm    10 cm Proximal to Ulnar Styloid Process 24.8 cm    Just Proximal to Ulnar Styloid Process 16.8 cm    Across Hand at [employer] 19 cm    At [place] of 2nd Digit 6.8 cm    At [facility] of Thumb 6.2 cm             Ptarrive her custom [employer] soft sleeve and glove - appear to fit well   pt to make sure she pulls up high enough - still hard time but doing better -getting it all the way up    She started using her pump about week ago - less fibrosis in forearm and upper arm  Pt fitted with Jovipak breast padat [facility] wks ago- unilateral breast pt to use during day and night for the next 2 wks as needed - reinforce importance to wear to clear thoracic lymphedema Pt to cont  with pump 2 x day - am and pm for about 45 min to hour - and then daycomrpession Follow up in month   Pt will not be able to use night time compression sleeve - has to help her husband at night time too - husband has lung CA  Pt physically helping husband the last few monthsAnd doing most every day tasks around the house- partially causing her lymphedema to increase the last few months - and had COVID 2nd shot March-? Pt to cont with pump, jovipak breast pad and day compression                OT Education - 07/29/20 1547    Education Details wearing of coompression and use of pump    Person(s) Educated Patient    Methods Explanation;Demonstration;Tactile cues;Handout;Verbal cues    Comprehension Verbalized understanding;Returned demonstration               OT Long Term Goals - 07/29/20 1549      OT LONG TERM GOAL #1   Title Pt to be indepedent in  MLD, and wearing of compression to decrease circumference in L UE by 1 cm and maintain her lymphdema    Status Achieved      OT LONG TERM GOAL #2   Title Pt to be fitted with correctly compression garments to maintain her lymphedema in L upper qaudrant to prevent infection    Status Achieved                 Plan - 07/29/20 1547    Clinical Impression Statement Pt doing very well with homeprogram using pump for L UE and chest lymphedema and [employer] soft sleeve and glove maintaining her lymphedema in L UE circumference - and less fibrosis in forearm and upper arm - pt to cont with homeprogram for month and return to me for follow up    OT Occupational Profile and History Problem Focused Assessment - Including review of records relating to presenting problem    Occupational performance deficits (Please refer to evaluation for details): IADL's;ADL's;Leisure    Body Structure / Function / Physical Skills Edema;Strength;Pain;ADL;Decreased knowledge of precautions    Rehab Potential Good    Clinical Decision  Making Limited treatment options, no task modification necessary    Comorbidities Affecting Occupational Performance: None    Modification or Assistance to Complete Evaluation  No modification of tasks or  assist necessary to complete eval    OT Frequency Monthly    OT Duration 4 weeks    OT Treatment/Interventions Self-care/ADL training;Therapeutic exercise;Patient/family education;Compression bandaging;Manual lymph drainage;Scar mobilization    Plan assess how she did with homeprogram for month    OT Home Exercise Plan see HEP    Consulted and Agree with Plan of Care Patient           Patient will benefit from skilled therapeutic intervention in order to improve the following deficits and impairments:   Body Structure / Function / Physical Skills: Edema, Strength, Pain, ADL, Decreased knowledge of precautions       Visit Diagnosis: Postmastectomy lymphedema syndrome    Problem List Patient Active Problem List   Diagnosis Date Noted  . Breast cancer ([place]) 01/17/2019  . Goals of care, counseling/discussion 12/08/2018  . Fatty liver disease, nonalcoholic 47/82/9562  . Heart murmur previously undiagnosed 12/15/2017  . Aortic valve sclerosis 12/15/2017  . Breast cancer, left breast ([place]) 02/18/2016  . Fecal incontinence 12/31/2014  . Irritable bowel syndrome with constipation and diarrhea 12/31/2014  . Lumbar radiculitis 06/08/2014  . DDD (degenerative disc disease), lumbar 05/21/2014  . Dyspnea on exertion 05/17/2014  . Edema 05/17/2014  . Trochanteric bursitis of left hip 04/19/2014  . Anemia 01/23/2014  . Diverticulosis 01/23/2014  . DM type 2 (diabetes mellitus, type 2) ([place]) 01/23/2014  . H/O adenomatous polyp of colon 01/23/2014  . HTN (hypertension) 01/23/2014  . Hyperlipidemia 01/23/2014  . Osteoarthritis 01/23/2014  . Other symptoms involving digestive system(787.99) 01/23/2014  . Personal history of breast cancer 01/23/2014  . Sleep apnea 01/23/2014     Rosalyn Gess OTR/l,CLT 07/29/2020, 3:51 PM  [place] [facility] [address] [place], [address] Phone: [phone]   Fax:  [phone]  Name: IDALIA ALLBRITTON MRN: [id number] Date of Birth: 04-15-1946

## 2020-08-26 ENCOUNTER — Ambulatory Visit: Payer: Medicare HMO | Admitting: Occupational Therapy

## 2020-10-21 ENCOUNTER — Other Ambulatory Visit: Payer: Self-pay | Admitting: Family Medicine

## 2020-10-21 DIAGNOSIS — Z1231 Encounter for screening mammogram for malignant neoplasm of breast: Secondary | ICD-10-CM

## 2020-11-17 ENCOUNTER — Other Ambulatory Visit: Payer: Self-pay | Admitting: Oncology

## 2020-11-26 ENCOUNTER — Other Ambulatory Visit: Payer: Self-pay

## 2020-11-26 ENCOUNTER — Ambulatory Visit
Admission: RE | Admit: 2020-11-26 | Discharge: 2020-11-26 | Disposition: A | Discharge status: Home / Self Care | Payer: Medicare HMO | Source: Ambulatory Visit | Attending: Family Medicine | Admitting: Family Medicine

## 2020-11-26 DIAGNOSIS — Z1231 Encounter for screening mammogram for malignant neoplasm of breast: Secondary | ICD-10-CM | POA: Insufficient documentation

## 2020-12-23 ENCOUNTER — Other Ambulatory Visit: Payer: Self-pay | Admitting: *Deleted

## 2020-12-23 DIAGNOSIS — E1165 Type 2 diabetes mellitus with hyperglycemia: Secondary | ICD-10-CM | POA: Diagnosis not present

## 2020-12-23 DIAGNOSIS — C50912 Malignant neoplasm of unspecified site of left female breast: Secondary | ICD-10-CM

## 2020-12-24 ENCOUNTER — Other Ambulatory Visit: Payer: Self-pay

## 2020-12-24 ENCOUNTER — Encounter: Payer: Self-pay | Admitting: Oncology

## 2020-12-24 ENCOUNTER — Inpatient Hospital Stay: Payer: Medicare HMO | Attending: Oncology

## 2020-12-24 ENCOUNTER — Inpatient Hospital Stay (HOSPITAL_BASED_OUTPATIENT_CLINIC_OR_DEPARTMENT_OTHER): Payer: Medicare HMO | Admitting: Oncology

## 2020-12-24 VITALS — BP 126/74 | HR 72 | Temp 98.0°F | Wt 207.1 lb

## 2020-12-24 DIAGNOSIS — Z08 Encounter for follow-up examination after completed treatment for malignant neoplasm: Secondary | ICD-10-CM

## 2020-12-24 DIAGNOSIS — Z9049 Acquired absence of other specified parts of digestive tract: Secondary | ICD-10-CM | POA: Insufficient documentation

## 2020-12-24 DIAGNOSIS — C50912 Malignant neoplasm of unspecified site of left female breast: Secondary | ICD-10-CM | POA: Diagnosis not present

## 2020-12-24 DIAGNOSIS — Z8673 Personal history of transient ischemic attack (TIA), and cerebral infarction without residual deficits: Secondary | ICD-10-CM | POA: Diagnosis not present

## 2020-12-24 DIAGNOSIS — Z853 Personal history of malignant neoplasm of breast: Secondary | ICD-10-CM

## 2020-12-24 DIAGNOSIS — Z79811 Long term (current) use of aromatase inhibitors: Secondary | ICD-10-CM | POA: Diagnosis not present

## 2020-12-24 DIAGNOSIS — Z8042 Family history of malignant neoplasm of prostate: Secondary | ICD-10-CM | POA: Diagnosis not present

## 2020-12-24 DIAGNOSIS — Z8249 Family history of ischemic heart disease and other diseases of the circulatory system: Secondary | ICD-10-CM | POA: Insufficient documentation

## 2020-12-24 DIAGNOSIS — Z801 Family history of malignant neoplasm of trachea, bronchus and lung: Secondary | ICD-10-CM | POA: Insufficient documentation

## 2020-12-24 DIAGNOSIS — Z808 Family history of malignant neoplasm of other organs or systems: Secondary | ICD-10-CM | POA: Diagnosis not present

## 2020-12-24 DIAGNOSIS — R5383 Other fatigue: Secondary | ICD-10-CM | POA: Diagnosis not present

## 2020-12-24 DIAGNOSIS — Z170 Estrogen receptor positive status [ER+]: Secondary | ICD-10-CM | POA: Insufficient documentation

## 2020-12-24 DIAGNOSIS — Z923 Personal history of irradiation: Secondary | ICD-10-CM | POA: Diagnosis not present

## 2020-12-24 DIAGNOSIS — Z87891 Personal history of nicotine dependence: Secondary | ICD-10-CM | POA: Insufficient documentation

## 2020-12-24 DIAGNOSIS — Z79899 Other long term (current) drug therapy: Secondary | ICD-10-CM | POA: Insufficient documentation

## 2020-12-24 DIAGNOSIS — Z5181 Encounter for therapeutic drug level monitoring: Secondary | ICD-10-CM

## 2020-12-24 LAB — CBC WITH DIFFERENTIAL/PLATELET
Abs Immature Granulocytes: 0.03 10*3/uL (ref 0.00–0.07)
Basophils Absolute: 0 10*3/uL (ref 0.0–0.1)
Basophils Relative: 0 %
Eosinophils Absolute: 0.2 10*3/uL (ref 0.0–0.5)
Eosinophils Relative: 3 %
HCT: 41.7 % (ref 36.0–46.0)
Hemoglobin: 14.3 g/dL (ref 12.0–15.0)
Immature Granulocytes: 1 %
Lymphocytes Relative: 21 %
Lymphs Abs: 1.1 10*3/uL (ref 0.7–4.0)
MCH: 29.9 pg (ref 26.0–34.0)
MCHC: 34.3 g/dL (ref 30.0–36.0)
MCV: 87.2 fL (ref 80.0–100.0)
Monocytes Absolute: 0.6 10*3/uL (ref 0.1–1.0)
Monocytes Relative: 10 %
Neutro Abs: 3.5 10*3/uL (ref 1.7–7.7)
Neutrophils Relative %: 65 %
Platelets: 238 10*3/uL (ref 150–400)
RBC: 4.78 MIL/uL (ref 3.87–5.11)
RDW: 13.2 % (ref 11.5–15.5)
WBC: 5.4 10*3/uL (ref 4.0–10.5)
nRBC: 0 % (ref 0.0–0.2)

## 2020-12-24 LAB — COMPREHENSIVE METABOLIC PANEL
ALT: 22 U/L (ref 0–44)
AST: 26 U/L (ref 15–41)
Albumin: 4.2 g/dL (ref 3.5–5.0)
Alkaline Phosphatase: 50 U/L (ref 38–126)
Anion gap: 11 (ref 5–15)
BUN: 13 mg/dL (ref 8–23)
CO2: 29 mmol/L (ref 22–32)
Calcium: 8.9 mg/dL (ref 8.9–10.3)
Chloride: 97 mmol/L — ABNORMAL LOW (ref 98–111)
Creatinine, Ser: 0.92 mg/dL (ref 0.44–1.00)
GFR, Estimated: >60 mL/min (ref >60)
Glucose, Bld: 216 mg/dL — ABNORMAL HIGH (ref 70–99)
Potassium: 3.4 mmol/L — ABNORMAL LOW (ref 3.5–5.1)
Sodium: 137 mmol/L (ref 135–145)
Total Bilirubin: 0.8 mg/dL (ref 0.3–1.2)
Total Protein: 6.8 g/dL (ref 6.5–8.1)

## 2020-12-24 NOTE — Progress Notes (Signed)
Patient doesn't have any no health concern to discuss but did want to ask you about taking the flu shot

## 2020-12-24 NOTE — Progress Notes (Signed)
Hematology/Oncology Consult note [facility]  Telephone:([phone] [phone]  Patient Care Team: [place] Pink, MD as PCP - General (Family Medicine)   Name of the patient: Jennifer Maddox  [id number]  05-22-1946   Date of visit: 12/24/20  Diagnosis- invasive mammary carcinoma of the left breast pathological prognostic stage IApT1 ppN0 cM0 ER PR positive and HER-2/neu negative status post mastectomy  Chief complaint/ Reason for visit-routine follow-up of breast cancer on Arimidex  Heme/Onc history: patient is a 75 year old female who was diagnosed with stage I right breast cancer in 2011. It was a T1b N0 M0 stage I tumor s/p lumpectomy. Oncotype DX score was 7 and she did not require adjuvant chemotherapy. She did complete adjuvant radiation and 5 years of hormone therapy.Mammogram and ultrasound on 11/24/2018 showed irregular hypoechoic mass in the left breast measuring 5 x 4 x 4 mm. No enlarged or morphologically abnormal lymph nodes in the left axilla. Patient underwent biopsy which showed invasive mammary carcinoma, grade 2, ER greater than 90% positive, PR greater than 90% positive. HER-2 was equivocal by IHC.FISH testingnegative  Patient underwent left mastectomy on 12/01/2018. Final pathology showed 51m, grade 2 invasive mammary carcinoma with negative margins. ER PR positive and HER-2/neu negative.3 sentinel and one non-sentinel lymph node was negative for malignancy.Oncotype testing came back with intermediate risk score of 18 and given her age patient does not require any adjuvant chemotherapy.She did not require postmastectomy radiation and is currently on Arimidex since March 2020.  Interval history-patient is tolerating Arimidex well without any significant side effects. Has mild baseline fatigue. Appetite and weight have remained stable. Denies any breast concerns today. She is the primary caregiver for her husband who has  stage IV lung cancer  ECOG PS- 1 Pain scale- 0   Review of systems- Review of Systems  Constitutional: Positive for malaise/fatigue. Negative for chills, fever and weight loss.  HENT: Negative for congestion, ear discharge and nosebleeds.   Eyes: Negative for blurred vision.  Respiratory: Negative for cough, hemoptysis, sputum production, shortness of breath and wheezing.   Cardiovascular: Negative for chest pain, palpitations, orthopnea and claudication.  Gastrointestinal: Negative for abdominal pain, blood in stool, constipation, diarrhea, heartburn, melena, nausea and vomiting.  Genitourinary: Negative for dysuria, flank pain, frequency, hematuria and urgency.  Musculoskeletal: Negative for back pain, joint pain and myalgias.  Skin: Negative for rash.  Neurological: Negative for dizziness, tingling, focal weakness, seizures, weakness and headaches.  Endo/Heme/Allergies: Does not bruise/bleed easily.  Psychiatric/Behavioral: Negative for depression and suicidal ideas. The patient does not have insomnia.      Allergies  Allergen Reactions  . Atorvastatin Other (See Comments)    Cramps  . Naproxen Sodium Other (See Comments)    Head and Neck Puritis     Past Medical History:  Diagnosis Date  . Anemia   . Arthritis   . Breast [place]   left breast  . Breast cancer, left breast ([facility]  . COPD (chronic obstructive pulmonary disease) ([facility]   . Degenerative disc disease, lumbar   . Diabetes mellitus without complication ([facility]   . Diverticulosis   . Hyperlipidemia   . Hypertension   . Personal history of radiation therapy   . Sleep apnea   . TIA (transient ischemic attack)      Past Surgical History:  Procedure Laterality Date  . ABDOMINAL HYSTERECTOMY     around age 75; ovaries intact  . BREAST BIOPSY Left 12/01/2018  Pathology LEFT breast mass 11 o'clock location: Invasive mammary  . BREAST EXCISIONAL BIOPSY Left 2011   breast ca with radation   . CHOLECYSTECTOMY    . COLONOSCOPY W/ POLYPECTOMY    . COLONOSCOPY WITH PROPOFOL N/A 10/29/2017   Procedure: COLONOSCOPY WITH PROPOFOL;  Surgeon: Manya Silvas, MD;  Location: [facility] ENDOSCOPY;  Service: Endoscopy;  Laterality: N/A;  . ESOPHAGOGASTRODUODENOSCOPY (EGD) WITH PROPOFOL N/A 10/29/2017   Procedure: ESOPHAGOGASTRODUODENOSCOPY (EGD) WITH PROPOFOL;  Surgeon: Manya Silvas, MD;  Location: [facility] ENDOSCOPY;  Service: Endoscopy;  Laterality: N/A;  . KNEE ARTHROSCOPY W/ MENISCAL REPAIR Right   . MASTECTOMY Left 2019  . SENTINEL NODE BIOPSY Left 01/17/2019   Procedure: SENTINEL NODE INJECTION;  Surgeon: Benjamine Sprague, DO;  Location: [facility] ORS;  Service: General;  Laterality: Left;  . TONSILLECTOMY    . TOTAL MASTECTOMY Left 01/17/2019   Procedure: TOTAL MASTECTOMY;  Surgeon: Benjamine Sprague, DO;  Location: [facility] ORS;  Service: General;  Laterality: Left;    Social History   Socioeconomic History  . Marital status: Married    Spouse name: Not on file  . Number of children: Not on file  . Years of education: Not on file  . Highest education level: Not on file  Occupational History  . Not on file  Tobacco Use  . Smoking status: Former Smoker    Quit date: 12/14/1977    Years since quitting: 43.0  . Smokeless tobacco: Never Used  Vaping Use  . Vaping Use: Never used  Substance and Sexual Activity  . Alcohol use: No  . Drug use: No  . Sexual activity: Not on file  Other Topics Concern  . Not on file  Social History Narrative  . Not on file   Social Determinants of Health   Financial Resource Strain: Not on file  Food Insecurity: Not on file  Transportation Needs: Not on file  Physical Activity: Not on file  Stress: Not on file  Social Connections: Not on file  Intimate Partner Violence: Not on file    Family History  Problem Relation Age of Onset  . Lung cancer Mother        smoker; deceased 91  . Heart attack Father        deceased 24  . Melanoma Brother 24        currently 20  . Prostate cancer Brother 55       currently 15  . Melanoma Son 22       below left armpit; currently 68  . Breast cancer Neg Hx      Current Outpatient Medications:  .  amLODipine (NORVASC) 2.5 MG tablet, Take 2.5 mg by mouth daily., Disp: , Rfl:  .  anastrozole (ARIMIDEX) 1 MG tablet, TAKE 1 TABLET BY MOUTH EVERY DAY, Disp: 90 tablet, Rfl: 1 .  blood glucose meter kit and supplies, USE TWICE DAILY, Disp: , Rfl:  .  clobetasol ointment (TEMOVATE) 4.03 %, Apply 1 application topically daily as needed (itching). , Disp: , Rfl: 1 .  furosemide (LASIX) 20 MG tablet, Take 20 mg by mouth 2 (two) times daily. , Disp: , Rfl:  .  glipiZIDE (GLUCOTROL XL) 2.5 MG 24 hr tablet, Take 1 tablet by mouth daily., Disp: , Rfl:  .  glucose blood test strip, , Disp: , Rfl:  .  ibuprofen (ADVIL,MOTRIN) 800 MG tablet, Take 1 tablet (800 mg total) by mouth every 8 (eight) hours as needed for mild pain or moderate pain., Disp: 30  tablet, Rfl: 0 .  losartan-hydrochlorothiazide (HYZAAR) 100-12.5 MG tablet, Take 1 tablet by mouth daily., Disp: , Rfl:  .  MELATONIN PO, Take 12 mg by mouth at bedtime. , Disp: , Rfl:  .  Multiple Vitamins-Minerals (MULTIVITAMIN ADULT PO), Take 1 tablet by mouth daily. , Disp: , Rfl:  .  pravastatin (PRAVACHOL) 40 MG tablet, Take 40 mg by mouth daily. , Disp: , Rfl:  .  Probiotic Product ([employer] PROBIOTIC) CHEW, Chew 500 mg by mouth daily at 6 (six) AM., Disp: , Rfl:  .  traZODone (DESYREL) 100 MG tablet, Take 150 mg by mouth at bedtime. , Disp: , Rfl:  .  gabapentin (NEURONTIN) 300 MG capsule, Take 300 mg by mouth at bedtime. , Disp: , Rfl:   Physical exam:  Vitals:   12/24/20 1022  BP: 126/74  Pulse: 72  Temp: 98 F (36.7 C)  TempSrc: Tympanic  Weight: 207 lb 1.6 oz (93.9 kg)   Physical Exam Constitutional:      General: She is not in acute distress. Eyes:     Extraocular Movements: EOM normal.  Cardiovascular:     Rate and Rhythm: Normal rate and regular  rhythm.     Heart sounds: Normal heart sounds.  Pulmonary:     Effort: Pulmonary effort is normal.     Breath sounds: Normal breath sounds.  Abdominal:     General: Bowel sounds are normal.     Palpations: Abdomen is soft.  Skin:    General: Skin is warm and dry.  Neurological:     Mental Status: She is alert and oriented to person, place, and time.   Breast exam: Not done today as patient had a breast exam with surgery a month ago and a recent mammogram a month ago as well. CMP Latest Ref Rng & Units 12/24/2020  Glucose 70 - 99 mg/dL 216(H)  BUN 8 - 23 mg/dL 13  Creatinine 0.44 - 1.00 mg/dL 0.92  Sodium 135 - 145 mmol/L 137  Potassium 3.5 - 5.1 mmol/L 3.4(L)  Chloride 98 - 111 mmol/L 97(L)  CO2 22 - 32 mmol/L 29  Calcium 8.9 - 10.3 mg/dL 8.9  Total Protein 6.5 - 8.1 g/dL 6.8  Total Bilirubin 0.3 - 1.2 mg/dL 0.8  Alkaline Phos 38 - 126 U/L 50  AST 15 - 41 U/L 26  ALT 0 - 44 U/L 22   CBC Latest Ref Rng & Units 12/24/2020  WBC 4.0 - 10.5 K/uL 5.4  Hemoglobin 12.0 - 15.0 g/dL 14.3  Hematocrit 36.0 - 46.0 % 41.7  Platelets 150 - 400 K/uL 238    No images are attached to the encounter.  MM 3D SCREEN BREAST UNI RIGHT  Result Date: 11/29/2020 CLINICAL DATA:  Screening. EXAM: DIGITAL SCREENING UNILATERAL RIGHT MAMMOGRAM WITH CAD AND TOMO COMPARISON:  Previous exam(s). ACR Breast Density Category a: The breast tissue is almost entirely fatty. FINDINGS: The patient has had a left mastectomy. There are no findings suspicious for malignancy. Images were processed with CAD. IMPRESSION: No mammographic evidence of malignancy. A result letter of this screening mammogram will be mailed directly to the patient. RECOMMENDATION: Screening mammogram in one year.  (Code:SM-R-71M) BI-RADS CATEGORY  1: Negative. Electronically Signed   By: Ammie Ferrier M.D.   On: 11/29/2020 08:44     Assessment and plan- Patient is a 75 y.o. female with history of left breast cancer ER PR positive HER-2  negative currently on Arimidex   Patient is tolerating Arimidex well without  any significant side effects and I have encouraged her to take calcium and vitamin D as well. She will need a repeat bone density scan in June 2022. Recent right breast mammogram was within normal limits.  I will see her back in 6 months for routine breast exam   Visit Diagnosis 1. Encounter for follow-up surveillance of breast cancer   2. Visit for monitoring Arimidex therapy      Dr. Randa Evens, MD, MPH [facility] at [facility] [phone] 12/24/2020 12:54 PM

## 2020-12-26 DIAGNOSIS — L570 Actinic keratosis: Secondary | ICD-10-CM | POA: Diagnosis not present

## 2020-12-26 DIAGNOSIS — L728 Other follicular cysts of the skin and subcutaneous tissue: Secondary | ICD-10-CM | POA: Diagnosis not present

## 2020-12-26 DIAGNOSIS — C44311 Basal cell carcinoma of skin of nose: Secondary | ICD-10-CM | POA: Diagnosis not present

## 2020-12-26 DIAGNOSIS — D485 Neoplasm of uncertain behavior of skin: Secondary | ICD-10-CM | POA: Diagnosis not present

## 2020-12-26 DIAGNOSIS — L900 Lichen sclerosus et atrophicus: Secondary | ICD-10-CM | POA: Diagnosis not present

## 2021-01-01 DIAGNOSIS — I10 Essential (primary) hypertension: Secondary | ICD-10-CM | POA: Diagnosis not present

## 2021-01-01 DIAGNOSIS — E785 Hyperlipidemia, unspecified: Secondary | ICD-10-CM | POA: Diagnosis not present

## 2021-01-01 DIAGNOSIS — Z636 Dependent relative needing care at home: Secondary | ICD-10-CM | POA: Diagnosis not present

## 2021-01-01 DIAGNOSIS — G4762 Sleep related leg cramps: Secondary | ICD-10-CM | POA: Diagnosis not present

## 2021-01-01 DIAGNOSIS — E1165 Type 2 diabetes mellitus with hyperglycemia: Secondary | ICD-10-CM | POA: Diagnosis not present

## 2021-01-01 DIAGNOSIS — Z6834 Body mass index (BMI) 34.0-34.9, adult: Secondary | ICD-10-CM | POA: Diagnosis not present

## 2021-01-02 DIAGNOSIS — L570 Actinic keratosis: Secondary | ICD-10-CM | POA: Diagnosis not present

## 2021-01-02 DIAGNOSIS — X32XXXA Exposure to sunlight, initial encounter: Secondary | ICD-10-CM | POA: Diagnosis not present

## 2021-01-23 DIAGNOSIS — G4733 Obstructive sleep apnea (adult) (pediatric): Secondary | ICD-10-CM | POA: Diagnosis not present

## 2021-02-04 DIAGNOSIS — L814 Other melanin hyperpigmentation: Secondary | ICD-10-CM | POA: Diagnosis not present

## 2021-02-04 DIAGNOSIS — C44311 Basal cell carcinoma of skin of nose: Secondary | ICD-10-CM | POA: Diagnosis not present

## 2021-02-04 DIAGNOSIS — L578 Other skin changes due to chronic exposure to nonionizing radiation: Secondary | ICD-10-CM | POA: Diagnosis not present

## 2021-02-04 DIAGNOSIS — L988 Other specified disorders of the skin and subcutaneous tissue: Secondary | ICD-10-CM | POA: Diagnosis not present

## 2021-02-10 DIAGNOSIS — R300 Dysuria: Secondary | ICD-10-CM | POA: Diagnosis not present

## 2021-03-25 DIAGNOSIS — E1165 Type 2 diabetes mellitus with hyperglycemia: Secondary | ICD-10-CM | POA: Diagnosis not present

## 2021-04-01 DIAGNOSIS — G609 Hereditary and idiopathic neuropathy, unspecified: Secondary | ICD-10-CM | POA: Diagnosis not present

## 2021-04-01 DIAGNOSIS — E876 Hypokalemia: Secondary | ICD-10-CM | POA: Diagnosis not present

## 2021-04-01 DIAGNOSIS — E1165 Type 2 diabetes mellitus with hyperglycemia: Secondary | ICD-10-CM | POA: Diagnosis not present

## 2021-04-01 DIAGNOSIS — I10 Essential (primary) hypertension: Secondary | ICD-10-CM | POA: Diagnosis not present

## 2021-05-08 DIAGNOSIS — E1165 Type 2 diabetes mellitus with hyperglycemia: Secondary | ICD-10-CM | POA: Diagnosis not present

## 2021-05-08 DIAGNOSIS — E1159 Type 2 diabetes mellitus with other circulatory complications: Secondary | ICD-10-CM | POA: Diagnosis not present

## 2021-05-08 DIAGNOSIS — E1169 Type 2 diabetes mellitus with other specified complication: Secondary | ICD-10-CM | POA: Diagnosis not present

## 2021-05-08 DIAGNOSIS — I152 Hypertension secondary to endocrine disorders: Secondary | ICD-10-CM | POA: Diagnosis not present

## 2021-05-08 DIAGNOSIS — E785 Hyperlipidemia, unspecified: Secondary | ICD-10-CM | POA: Diagnosis not present

## 2021-05-17 ENCOUNTER — Other Ambulatory Visit: Payer: Self-pay | Admitting: Oncology

## 2021-05-20 ENCOUNTER — Ambulatory Visit
Admission: RE | Admit: 2021-05-20 | Discharge: 2021-05-20 | Disposition: A | Discharge status: Home / Self Care | Payer: Medicare HMO | Source: Ambulatory Visit | Attending: Oncology | Admitting: Oncology

## 2021-05-20 ENCOUNTER — Other Ambulatory Visit: Payer: Self-pay

## 2021-05-20 DIAGNOSIS — Z853 Personal history of malignant neoplasm of breast: Secondary | ICD-10-CM | POA: Insufficient documentation

## 2021-05-20 DIAGNOSIS — Z08 Encounter for follow-up examination after completed treatment for malignant neoplasm: Secondary | ICD-10-CM

## 2021-05-20 DIAGNOSIS — Z780 Asymptomatic menopausal state: Secondary | ICD-10-CM | POA: Insufficient documentation

## 2021-05-20 DIAGNOSIS — M85852 Other specified disorders of bone density and structure, left thigh: Secondary | ICD-10-CM | POA: Diagnosis not present

## 2021-05-27 DIAGNOSIS — G4733 Obstructive sleep apnea (adult) (pediatric): Secondary | ICD-10-CM | POA: Diagnosis not present

## 2021-06-05 DIAGNOSIS — Z4432 Encounter for fitting and adjustment of external left breast prosthesis: Secondary | ICD-10-CM | POA: Diagnosis not present

## 2021-06-05 DIAGNOSIS — C50112 Malignant neoplasm of central portion of left female breast: Secondary | ICD-10-CM | POA: Diagnosis not present

## 2021-06-23 ENCOUNTER — Other Ambulatory Visit: Payer: Self-pay

## 2021-06-23 ENCOUNTER — Inpatient Hospital Stay (HOSPITAL_BASED_OUTPATIENT_CLINIC_OR_DEPARTMENT_OTHER): Payer: Medicare HMO | Admitting: Oncology

## 2021-06-23 ENCOUNTER — Encounter: Payer: Self-pay | Admitting: Oncology

## 2021-06-23 ENCOUNTER — Inpatient Hospital Stay: Payer: Medicare HMO | Attending: Oncology

## 2021-06-23 VITALS — BP 145/56 | HR 60 | Temp 97.6°F | Resp 20 | Wt 210.3 lb

## 2021-06-23 DIAGNOSIS — Z801 Family history of malignant neoplasm of trachea, bronchus and lung: Secondary | ICD-10-CM | POA: Insufficient documentation

## 2021-06-23 DIAGNOSIS — Z08 Encounter for follow-up examination after completed treatment for malignant neoplasm: Secondary | ICD-10-CM

## 2021-06-23 DIAGNOSIS — Z9049 Acquired absence of other specified parts of digestive tract: Secondary | ICD-10-CM | POA: Insufficient documentation

## 2021-06-23 DIAGNOSIS — Z170 Estrogen receptor positive status [ER+]: Secondary | ICD-10-CM | POA: Diagnosis not present

## 2021-06-23 DIAGNOSIS — Z79811 Long term (current) use of aromatase inhibitors: Secondary | ICD-10-CM | POA: Insufficient documentation

## 2021-06-23 DIAGNOSIS — C50912 Malignant neoplasm of unspecified site of left female breast: Secondary | ICD-10-CM | POA: Diagnosis not present

## 2021-06-23 DIAGNOSIS — Z808 Family history of malignant neoplasm of other organs or systems: Secondary | ICD-10-CM | POA: Insufficient documentation

## 2021-06-23 DIAGNOSIS — Z8042 Family history of malignant neoplasm of prostate: Secondary | ICD-10-CM | POA: Diagnosis not present

## 2021-06-23 DIAGNOSIS — Z5181 Encounter for therapeutic drug level monitoring: Secondary | ICD-10-CM | POA: Diagnosis not present

## 2021-06-23 DIAGNOSIS — Z8249 Family history of ischemic heart disease and other diseases of the circulatory system: Secondary | ICD-10-CM | POA: Insufficient documentation

## 2021-06-23 DIAGNOSIS — Z79899 Other long term (current) drug therapy: Secondary | ICD-10-CM | POA: Insufficient documentation

## 2021-06-23 DIAGNOSIS — Z8673 Personal history of transient ischemic attack (TIA), and cerebral infarction without residual deficits: Secondary | ICD-10-CM | POA: Diagnosis not present

## 2021-06-23 DIAGNOSIS — Z853 Personal history of malignant neoplasm of breast: Secondary | ICD-10-CM

## 2021-06-23 DIAGNOSIS — Z923 Personal history of irradiation: Secondary | ICD-10-CM | POA: Diagnosis not present

## 2021-06-23 LAB — COMPREHENSIVE METABOLIC PANEL
ALT: 15 U/L (ref 0–44)
AST: 18 U/L (ref 15–41)
Albumin: 4.3 g/dL (ref 3.5–5.0)
Alkaline Phosphatase: 49 U/L (ref 38–126)
Anion gap: 8 (ref 5–15)
BUN: 19 mg/dL (ref 8–23)
CO2: 32 mmol/L (ref 22–32)
Calcium: 9.5 mg/dL (ref 8.9–10.3)
Chloride: 100 mmol/L (ref 98–111)
Creatinine, Ser: 0.87 mg/dL (ref 0.44–1.00)
GFR, Estimated: >60 mL/min (ref >60)
Glucose, Bld: 139 mg/dL — ABNORMAL HIGH (ref 70–99)
Potassium: 3.7 mmol/L (ref 3.5–5.1)
Sodium: 140 mmol/L (ref 135–145)
Total Bilirubin: 0.6 mg/dL (ref 0.3–1.2)
Total Protein: 6.9 g/dL (ref 6.5–8.1)

## 2021-06-23 LAB — CBC WITH DIFFERENTIAL/PLATELET
Abs Immature Granulocytes: 0.02 10*3/uL (ref 0.00–0.07)
Basophils Absolute: 0 10*3/uL (ref 0.0–0.1)
Basophils Relative: 0 %
Eosinophils Absolute: 0.1 10*3/uL (ref 0.0–0.5)
Eosinophils Relative: 2 %
HCT: 40.5 % (ref 36.0–46.0)
Hemoglobin: 13.3 g/dL (ref 12.0–15.0)
Immature Granulocytes: 0 %
Lymphocytes Relative: 22 %
Lymphs Abs: 1.1 10*3/uL (ref 0.7–4.0)
MCH: 30 pg (ref 26.0–34.0)
MCHC: 32.8 g/dL (ref 30.0–36.0)
MCV: 91.2 fL (ref 80.0–100.0)
Monocytes Absolute: 0.6 10*3/uL (ref 0.1–1.0)
Monocytes Relative: 12 %
Neutro Abs: 3.2 10*3/uL (ref 1.7–7.7)
Neutrophils Relative %: 64 %
Platelets: 241 10*3/uL (ref 150–400)
RBC: 4.44 MIL/uL (ref 3.87–5.11)
RDW: 13.4 % (ref 11.5–15.5)
WBC: 5.2 10*3/uL (ref 4.0–10.5)
nRBC: 0 % (ref 0.0–0.2)

## 2021-06-23 NOTE — Progress Notes (Signed)
Hematology/Oncology Consult note [facility]  [phone] [phone]  Patient Care Team: [place] Pink, MD as PCP - General (Family Medicine)   Name of the patient: Jennifer Maddox  [id number]  1946-06-13   Date of visit: 06/23/21  Diagnosis-  invasive mammary carcinoma of the left breast pathological prognostic stage IA pT1 ppN0 cM0 ER PR positive and HER-2/neu negative status post mastectomy   Chief complaint/ Reason for visit-routine follow-up of breast cancer  Heme/Onc history: patient is a 75 year old female who was diagnosed with stage I right breast cancer in 2011.  It was a T1b N0 M0 stage I tumor s/p lumpectomy.  Oncotype DX score was 7 and she did not require adjuvant chemotherapy.  She did complete adjuvant radiation and 5 years of hormone therapy.  Mammogram and ultrasound on 11/24/2018 showed irregular hypoechoic mass in the left breast measuring 5 x 4 x 4 mm.  No enlarged or morphologically abnormal lymph nodes in the left axilla.  Patient underwent biopsy which showed invasive mammary carcinoma, grade 2, ER greater than 90% positive, PR greater than 90% positive.  HER-2 was equivocal by IHC.  FISH testing negative   Patient underwent left mastectomy on 12/01/2018.  Final pathology showed 6 mm, grade 2 invasive mammary carcinoma with negative margins.  ER PR positive and HER-2/neu negative.  3 sentinel and one non-sentinel lymph node was negative for malignancy.  Oncotype testing came back with intermediate risk score of 18 and given her age patient does not require any adjuvant chemotherapy.  She did not require postmastectomy radiation and is currently on Arimidex since March 2020.    Interval history-patient is doing well on Arimidex presently and reports no new complaints at this time.Denies any breast concerns today  ECOG PS- 1 Pain scale- 0   Review of systems- Review of Systems  Constitutional:  Positive for  malaise/fatigue. Negative for chills, fever and weight loss.  HENT:  Negative for congestion, ear discharge and nosebleeds.   Eyes:  Negative for blurred vision.  Respiratory:  Negative for cough, hemoptysis, sputum production, shortness of breath and wheezing.   Cardiovascular:  Negative for chest pain, palpitations, orthopnea and claudication.  Gastrointestinal:  Negative for abdominal pain, blood in stool, constipation, diarrhea, heartburn, melena, nausea and vomiting.  Genitourinary:  Negative for dysuria, flank pain, frequency, hematuria and urgency.  Musculoskeletal:  Negative for back pain, joint pain and myalgias.  Skin:  Negative for rash.  Neurological:  Negative for dizziness, tingling, focal weakness, seizures, weakness and headaches.  Endo/Heme/Allergies:  Does not bruise/bleed easily.  Psychiatric/Behavioral:  Negative for depression and suicidal ideas. The patient does not have insomnia.       Allergies  Allergen Reactions   Atorvastatin Other (See Comments)    Cramps   Naproxen Sodium Other (See Comments)    Head and Neck Puritis     Past Medical History:  Diagnosis Date   Anemia    Arthritis    Breast cancer ([place]) 2011/2019   left breast   Breast cancer, left breast ([place]) 2011   COPD (chronic obstructive pulmonary disease) ([facility])    Degenerative disc disease, lumbar    Diabetes mellitus without complication ([place])    Diverticulosis    Hyperlipidemia    Hypertension    Personal history of radiation therapy    Sleep apnea    TIA (transient ischemic attack)      Past Surgical History:  Procedure Laterality Date   ABDOMINAL HYSTERECTOMY  around age 37 ; ovaries intact   BREAST BIOPSY Left 12/01/2018   Pathology LEFT breast mass 11 o'clock location: Invasive mammary   BREAST EXCISIONAL BIOPSY Left 2011   breast ca with radation   CHOLECYSTECTOMY     COLONOSCOPY W/ POLYPECTOMY     COLONOSCOPY WITH PROPOFOL N/A 10/29/2017   Procedure: COLONOSCOPY WITH  PROPOFOL;  Surgeon: Manya Silvas, MD;  Location: [facility] ENDOSCOPY;  Service: Endoscopy;  Laterality: N/A;   ESOPHAGOGASTRODUODENOSCOPY (EGD) WITH PROPOFOL N/A 10/29/2017   Procedure: ESOPHAGOGASTRODUODENOSCOPY (EGD) WITH PROPOFOL;  Surgeon: Manya Silvas, MD;  Location[facility] ENDOSCOPY;  Service: Endoscopy;  Laterality: N/A;   KNEE ARTHROSCOPY W/ MENISCAL REPAIR Right    MASTECTOMY Left 2019   SENTINEL NODE BIOPSY Left 01/17/2019   Procedure: SENTINEL NODE INJECTION;  Surgeon: Benjamine Sprague, DO;  Location: [facility] ORS;  Service: General;  Laterality: Left;   TONSILLECTOMY     TOTAL MASTECTOMY Left 01/17/2019   Procedure: TOTAL MASTECTOMY;  Surgeon: Benjamine Sprague, DO;  Location: [facility] ORS;  Service: General;  Laterality: Left;    Social History   Socioeconomic History   Marital status: Married    Spouse name: Not on file   Number of children: Not on file   Years of education: Not on file   Highest education level: Not on file  Occupational History   Not on file  Tobacco Use   Smoking status: Former    Pack years: 0.00    Types: Cigarettes    Quit date: 12/14/1977    Years since quitting: 43.5   Smokeless tobacco: Never  Vaping Use   Vaping Use: Never used  Substance and Sexual Activity   Alcohol use: No   Drug use: No   Sexual activity: Not on file  Other Topics Concern   Not on file  Social History Narrative   Not on file   Social Determinants of Health   Financial Resource Strain: Not on file  Food Insecurity: Not on file  Transportation Needs: Not on file  Physical Activity: Not on file  Stress: Not on file  Social Connections: Not on file  Intimate Partner Violence: Not on file    Family History  Problem Relation Age of Onset   Lung cancer Mother        smoker; deceased 19   Heart attack Father        deceased 81   Melanoma Brother 43       currently 90   Prostate cancer Brother 28       currently 23   Melanoma Son 45       below left armpit; currently 25    Breast cancer Neg Hx      Current Outpatient Medications:    amLODipine (NORVASC) 2.5 MG tablet, Take 2.5 mg by mouth daily., Disp: , Rfl:    anastrozole (ARIMIDEX) 1 MG tablet, TAKE 1 TABLET BY MOUTH EVERY DAY, Disp: 90 tablet, Rfl: 1   blood glucose meter kit and supplies, USE TWICE DAILY, Disp: , Rfl:    clobetasol ointment (TEMOVATE) 4.81 %, Apply 1 application topically daily as needed (itching). , Disp: , Rfl: 1   furosemide (LASIX) 20 MG tablet, Take 20 mg by mouth 2 (two) times daily. , Disp: , Rfl:    gabapentin (NEURONTIN) 300 MG capsule, Take 300 mg by mouth at bedtime. , Disp: , Rfl:    glipiZIDE (GLUCOTROL XL) 2.5 MG 24 hr tablet, Take 1 tablet by mouth daily., Disp: ,  Rfl:    glucose blood test strip, , Disp: , Rfl:    ibuprofen (ADVIL,MOTRIN) 800 MG tablet, Take 1 tablet (800 mg total) by mouth every 8 (eight) hours as needed for mild pain or moderate pain., Disp: 30 tablet, Rfl: 0   MELATONIN PO, Take 12 mg by mouth at bedtime. , Disp: , Rfl:    Multiple Vitamins-Minerals (MULTIVITAMIN ADULT PO), Take 1 tablet by mouth daily. , Disp: , Rfl:    pravastatin (PRAVACHOL) 40 MG tablet, Take 40 mg by mouth daily. , Disp: , Rfl:    Probiotic Product ([employer] PROBIOTIC) CHEW, Chew 500 mg by mouth daily at 6 (six) AM., Disp: , Rfl:    traZODone (DESYREL) 100 MG tablet, Take 150 mg by mouth at bedtime. , Disp: , Rfl:    losartan-hydrochlorothiazide (HYZAAR) 100-12.5 MG tablet, Take 1 tablet by mouth daily., Disp: , Rfl:   Physical exam:  Vitals:   06/23/21 1106  BP: (!) 145/56  Pulse: 60  Resp: 20  Temp: 97.6 F (36.4 C)  TempSrc: Tympanic  SpO2: 100%  Weight: 210 lb 4.8 oz (95.4 kg)   Physical Exam Constitutional:      General: She is not in acute distress. Cardiovascular:     Rate and Rhythm: Normal rate and regular rhythm.     Heart sounds: Normal heart sounds.  Pulmonary:     Effort: Pulmonary effort is normal.     Breath sounds: Normal breath sounds.  Abdominal:      General: Bowel sounds are normal.     Palpations: Abdomen is soft.  Skin:    General: Skin is warm and dry.  Neurological:     Mental Status: She is alert and oriented to person, place, and time.    Plan: Patient is s/p left mastectomy without reconstruction.  No evidence of chest wall recurrence.  No palpable masses in the right breast.  No palpable right axillary adenopathy   CMP Latest Ref Rng & Units 06/23/2021  Glucose 70 - 99 mg/dL 139(H)  BUN 8 - 23 mg/dL 19  Creatinine 0.44 - 1.00 mg/dL 0.87  Sodium 135 - 145 mmol/L 140  Potassium 3.5 - 5.1 mmol/L 3.7  Chloride 98 - 111 mmol/L 100  CO2 22 - 32 mmol/L 32  Calcium 8.9 - 10.3 mg/dL 9.5  Total Protein 6.5 - 8.1 g/dL 6.9  Total Bilirubin 0.3 - 1.2 mg/dL 0.6  Alkaline Phos 38 - 126 U/L 49  AST 15 - 41 U/L 18  ALT 0 - 44 U/L 15   CBC Latest Ref Rng & Units 06/23/2021  WBC 4.0 - 10.5 K/uL 5.2  Hemoglobin 12.0 - 15.0 g/dL 13.3  Hematocrit 36.0 - 46.0 % 40.5  Platelets 150 - 400 K/uL 241     Assessment and plan- Patient is a 75 y.o. female with history of stage I ER/PR positive HER2 negative left breast cancer on Arimidex and this is a routine follow-up visit  Patient is tolerating Arimidex well so far and will continue it for 5 years ending in March 2025.  She will also continue to take calcium and vitamin D.  Patient had a repeat bone density scan in June 2022 which showed her 10-year probability of a major osteoporotic fracture was 10.5% and hip fracture was 1.9%.  She does not require any adjuvant bisphosphonates at this time.  I will schedule her for right breast mammogram for December 2022 when she will be seen by covering NP in 6 months and  I will see her back in 1 year   Visit Diagnosis 1. Encounter for follow-up surveillance of breast cancer   2. Visit for monitoring Arimidex therapy      Dr. Randa Evens, MD, MPH [facility] at [facility] [phone] 06/23/2021 7:37 PM

## 2021-07-02 DIAGNOSIS — E1165 Type 2 diabetes mellitus with hyperglycemia: Secondary | ICD-10-CM | POA: Diagnosis not present

## 2021-07-04 DIAGNOSIS — X32XXXA Exposure to sunlight, initial encounter: Secondary | ICD-10-CM | POA: Diagnosis not present

## 2021-07-04 DIAGNOSIS — Z872 Personal history of diseases of the skin and subcutaneous tissue: Secondary | ICD-10-CM | POA: Diagnosis not present

## 2021-07-04 DIAGNOSIS — Z85828 Personal history of other malignant neoplasm of skin: Secondary | ICD-10-CM | POA: Diagnosis not present

## 2021-07-04 DIAGNOSIS — L900 Lichen sclerosus et atrophicus: Secondary | ICD-10-CM | POA: Diagnosis not present

## 2021-07-04 DIAGNOSIS — D485 Neoplasm of uncertain behavior of skin: Secondary | ICD-10-CM | POA: Diagnosis not present

## 2021-07-04 DIAGNOSIS — D2272 Melanocytic nevi of left lower limb, including hip: Secondary | ICD-10-CM | POA: Diagnosis not present

## 2021-07-04 DIAGNOSIS — D2239 Melanocytic nevi of other parts of face: Secondary | ICD-10-CM | POA: Diagnosis not present

## 2021-07-04 DIAGNOSIS — L570 Actinic keratosis: Secondary | ICD-10-CM | POA: Diagnosis not present

## 2021-07-04 DIAGNOSIS — D2262 Melanocytic nevi of left upper limb, including shoulder: Secondary | ICD-10-CM | POA: Diagnosis not present

## 2021-07-09 DIAGNOSIS — E785 Hyperlipidemia, unspecified: Secondary | ICD-10-CM | POA: Diagnosis not present

## 2021-07-09 DIAGNOSIS — F4321 Adjustment disorder with depressed mood: Secondary | ICD-10-CM | POA: Diagnosis not present

## 2021-07-09 DIAGNOSIS — E119 Type 2 diabetes mellitus without complications: Secondary | ICD-10-CM | POA: Diagnosis not present

## 2021-07-09 DIAGNOSIS — I10 Essential (primary) hypertension: Secondary | ICD-10-CM | POA: Diagnosis not present

## 2021-07-29 DIAGNOSIS — M25511 Pain in right shoulder: Secondary | ICD-10-CM | POA: Diagnosis not present

## 2021-07-29 DIAGNOSIS — M5412 Radiculopathy, cervical region: Secondary | ICD-10-CM | POA: Diagnosis not present

## 2021-08-13 DIAGNOSIS — I152 Hypertension secondary to endocrine disorders: Secondary | ICD-10-CM | POA: Diagnosis not present

## 2021-08-13 DIAGNOSIS — E1159 Type 2 diabetes mellitus with other circulatory complications: Secondary | ICD-10-CM | POA: Diagnosis not present

## 2021-08-13 DIAGNOSIS — E1169 Type 2 diabetes mellitus with other specified complication: Secondary | ICD-10-CM | POA: Diagnosis not present

## 2021-08-13 DIAGNOSIS — E1165 Type 2 diabetes mellitus with hyperglycemia: Secondary | ICD-10-CM | POA: Diagnosis not present

## 2021-08-13 DIAGNOSIS — E785 Hyperlipidemia, unspecified: Secondary | ICD-10-CM | POA: Diagnosis not present

## 2021-08-28 DIAGNOSIS — M9902 Segmental and somatic dysfunction of thoracic region: Secondary | ICD-10-CM | POA: Diagnosis not present

## 2021-08-28 DIAGNOSIS — M9901 Segmental and somatic dysfunction of cervical region: Secondary | ICD-10-CM | POA: Diagnosis not present

## 2021-08-28 DIAGNOSIS — M5383 Other specified dorsopathies, cervicothoracic region: Secondary | ICD-10-CM | POA: Diagnosis not present

## 2021-08-28 DIAGNOSIS — M9904 Segmental and somatic dysfunction of sacral region: Secondary | ICD-10-CM | POA: Diagnosis not present

## 2021-08-28 DIAGNOSIS — M461 Sacroiliitis, not elsewhere classified: Secondary | ICD-10-CM | POA: Diagnosis not present

## 2021-08-28 DIAGNOSIS — M9903 Segmental and somatic dysfunction of lumbar region: Secondary | ICD-10-CM | POA: Diagnosis not present

## 2021-09-01 DIAGNOSIS — M25511 Pain in right shoulder: Secondary | ICD-10-CM | POA: Diagnosis not present

## 2021-09-01 DIAGNOSIS — M542 Cervicalgia: Secondary | ICD-10-CM | POA: Diagnosis not present

## 2021-09-01 DIAGNOSIS — M5412 Radiculopathy, cervical region: Secondary | ICD-10-CM | POA: Diagnosis not present

## 2021-09-04 DIAGNOSIS — M5383 Other specified dorsopathies, cervicothoracic region: Secondary | ICD-10-CM | POA: Diagnosis not present

## 2021-09-04 DIAGNOSIS — M9904 Segmental and somatic dysfunction of sacral region: Secondary | ICD-10-CM | POA: Diagnosis not present

## 2021-09-04 DIAGNOSIS — M9903 Segmental and somatic dysfunction of lumbar region: Secondary | ICD-10-CM | POA: Diagnosis not present

## 2021-09-04 DIAGNOSIS — M9902 Segmental and somatic dysfunction of thoracic region: Secondary | ICD-10-CM | POA: Diagnosis not present

## 2021-09-04 DIAGNOSIS — M9901 Segmental and somatic dysfunction of cervical region: Secondary | ICD-10-CM | POA: Diagnosis not present

## 2021-09-04 DIAGNOSIS — M461 Sacroiliitis, not elsewhere classified: Secondary | ICD-10-CM | POA: Diagnosis not present

## 2021-09-10 DIAGNOSIS — M542 Cervicalgia: Secondary | ICD-10-CM | POA: Diagnosis not present

## 2021-09-10 DIAGNOSIS — M461 Sacroiliitis, not elsewhere classified: Secondary | ICD-10-CM | POA: Diagnosis not present

## 2021-09-10 DIAGNOSIS — M5383 Other specified dorsopathies, cervicothoracic region: Secondary | ICD-10-CM | POA: Diagnosis not present

## 2021-09-10 DIAGNOSIS — M9904 Segmental and somatic dysfunction of sacral region: Secondary | ICD-10-CM | POA: Diagnosis not present

## 2021-09-10 DIAGNOSIS — M9901 Segmental and somatic dysfunction of cervical region: Secondary | ICD-10-CM | POA: Diagnosis not present

## 2021-09-10 DIAGNOSIS — M9903 Segmental and somatic dysfunction of lumbar region: Secondary | ICD-10-CM | POA: Diagnosis not present

## 2021-09-10 DIAGNOSIS — M9902 Segmental and somatic dysfunction of thoracic region: Secondary | ICD-10-CM | POA: Diagnosis not present

## 2021-09-16 DIAGNOSIS — M461 Sacroiliitis, not elsewhere classified: Secondary | ICD-10-CM | POA: Diagnosis not present

## 2021-09-16 DIAGNOSIS — M9904 Segmental and somatic dysfunction of sacral region: Secondary | ICD-10-CM | POA: Diagnosis not present

## 2021-09-16 DIAGNOSIS — M9902 Segmental and somatic dysfunction of thoracic region: Secondary | ICD-10-CM | POA: Diagnosis not present

## 2021-09-16 DIAGNOSIS — M9903 Segmental and somatic dysfunction of lumbar region: Secondary | ICD-10-CM | POA: Diagnosis not present

## 2021-09-16 DIAGNOSIS — M5383 Other specified dorsopathies, cervicothoracic region: Secondary | ICD-10-CM | POA: Diagnosis not present

## 2021-09-16 DIAGNOSIS — M9901 Segmental and somatic dysfunction of cervical region: Secondary | ICD-10-CM | POA: Diagnosis not present

## 2021-09-17 DIAGNOSIS — M542 Cervicalgia: Secondary | ICD-10-CM | POA: Diagnosis not present

## 2021-09-18 DIAGNOSIS — E119 Type 2 diabetes mellitus without complications: Secondary | ICD-10-CM | POA: Diagnosis not present

## 2021-09-18 DIAGNOSIS — I10 Essential (primary) hypertension: Secondary | ICD-10-CM | POA: Diagnosis not present

## 2021-09-18 DIAGNOSIS — Z23 Encounter for immunization: Secondary | ICD-10-CM | POA: Diagnosis not present

## 2021-09-18 DIAGNOSIS — Z0000 Encounter for general adult medical examination without abnormal findings: Secondary | ICD-10-CM | POA: Diagnosis not present

## 2021-09-23 DIAGNOSIS — M5383 Other specified dorsopathies, cervicothoracic region: Secondary | ICD-10-CM | POA: Diagnosis not present

## 2021-09-23 DIAGNOSIS — M9904 Segmental and somatic dysfunction of sacral region: Secondary | ICD-10-CM | POA: Diagnosis not present

## 2021-09-23 DIAGNOSIS — M9903 Segmental and somatic dysfunction of lumbar region: Secondary | ICD-10-CM | POA: Diagnosis not present

## 2021-09-23 DIAGNOSIS — M461 Sacroiliitis, not elsewhere classified: Secondary | ICD-10-CM | POA: Diagnosis not present

## 2021-09-23 DIAGNOSIS — M9901 Segmental and somatic dysfunction of cervical region: Secondary | ICD-10-CM | POA: Diagnosis not present

## 2021-09-23 DIAGNOSIS — M9902 Segmental and somatic dysfunction of thoracic region: Secondary | ICD-10-CM | POA: Diagnosis not present

## 2021-09-25 DIAGNOSIS — M542 Cervicalgia: Secondary | ICD-10-CM | POA: Diagnosis not present

## 2021-09-30 DIAGNOSIS — M9904 Segmental and somatic dysfunction of sacral region: Secondary | ICD-10-CM | POA: Diagnosis not present

## 2021-09-30 DIAGNOSIS — M5383 Other specified dorsopathies, cervicothoracic region: Secondary | ICD-10-CM | POA: Diagnosis not present

## 2021-09-30 DIAGNOSIS — M9902 Segmental and somatic dysfunction of thoracic region: Secondary | ICD-10-CM | POA: Diagnosis not present

## 2021-09-30 DIAGNOSIS — M9901 Segmental and somatic dysfunction of cervical region: Secondary | ICD-10-CM | POA: Diagnosis not present

## 2021-09-30 DIAGNOSIS — M9903 Segmental and somatic dysfunction of lumbar region: Secondary | ICD-10-CM | POA: Diagnosis not present

## 2021-09-30 DIAGNOSIS — M75111 Incomplete rotator cuff tear or rupture of right shoulder, not specified as traumatic: Secondary | ICD-10-CM | POA: Diagnosis not present

## 2021-09-30 DIAGNOSIS — M461 Sacroiliitis, not elsewhere classified: Secondary | ICD-10-CM | POA: Diagnosis not present

## 2021-10-02 DIAGNOSIS — E1165 Type 2 diabetes mellitus with hyperglycemia: Secondary | ICD-10-CM | POA: Diagnosis not present

## 2021-10-02 DIAGNOSIS — I10 Essential (primary) hypertension: Secondary | ICD-10-CM | POA: Diagnosis not present

## 2021-10-07 DIAGNOSIS — M9904 Segmental and somatic dysfunction of sacral region: Secondary | ICD-10-CM | POA: Diagnosis not present

## 2021-10-07 DIAGNOSIS — M9902 Segmental and somatic dysfunction of thoracic region: Secondary | ICD-10-CM | POA: Diagnosis not present

## 2021-10-07 DIAGNOSIS — M5383 Other specified dorsopathies, cervicothoracic region: Secondary | ICD-10-CM | POA: Diagnosis not present

## 2021-10-07 DIAGNOSIS — M9901 Segmental and somatic dysfunction of cervical region: Secondary | ICD-10-CM | POA: Diagnosis not present

## 2021-10-07 DIAGNOSIS — M461 Sacroiliitis, not elsewhere classified: Secondary | ICD-10-CM | POA: Diagnosis not present

## 2021-10-07 DIAGNOSIS — M9903 Segmental and somatic dysfunction of lumbar region: Secondary | ICD-10-CM | POA: Diagnosis not present

## 2021-10-13 DIAGNOSIS — E119 Type 2 diabetes mellitus without complications: Secondary | ICD-10-CM | POA: Diagnosis not present

## 2021-10-16 DIAGNOSIS — E1165 Type 2 diabetes mellitus with hyperglycemia: Secondary | ICD-10-CM | POA: Diagnosis not present

## 2021-10-16 DIAGNOSIS — E1159 Type 2 diabetes mellitus with other circulatory complications: Secondary | ICD-10-CM | POA: Diagnosis not present

## 2021-10-16 DIAGNOSIS — I152 Hypertension secondary to endocrine disorders: Secondary | ICD-10-CM | POA: Diagnosis not present

## 2021-10-16 DIAGNOSIS — E1169 Type 2 diabetes mellitus with other specified complication: Secondary | ICD-10-CM | POA: Diagnosis not present

## 2021-10-16 DIAGNOSIS — E785 Hyperlipidemia, unspecified: Secondary | ICD-10-CM | POA: Diagnosis not present

## 2021-10-20 ENCOUNTER — Emergency Department: Payer: Medicare HMO

## 2021-10-20 ENCOUNTER — Emergency Department
Admission: EM | Admit: 2021-10-20 | Discharge: 2021-10-20 | Disposition: A | Discharge status: Home / Self Care | Payer: Medicare HMO | Attending: Emergency Medicine | Admitting: Emergency Medicine

## 2021-10-20 DIAGNOSIS — E1169 Type 2 diabetes mellitus with other specified complication: Secondary | ICD-10-CM | POA: Diagnosis not present

## 2021-10-20 DIAGNOSIS — R079 Chest pain, unspecified: Secondary | ICD-10-CM

## 2021-10-20 DIAGNOSIS — Z853 Personal history of malignant neoplasm of breast: Secondary | ICD-10-CM | POA: Insufficient documentation

## 2021-10-20 DIAGNOSIS — Z7984 Long term (current) use of oral hypoglycemic drugs: Secondary | ICD-10-CM | POA: Diagnosis not present

## 2021-10-20 DIAGNOSIS — J449 Chronic obstructive pulmonary disease, unspecified: Secondary | ICD-10-CM | POA: Diagnosis not present

## 2021-10-20 DIAGNOSIS — Z79899 Other long term (current) drug therapy: Secondary | ICD-10-CM | POA: Insufficient documentation

## 2021-10-20 DIAGNOSIS — I10 Essential (primary) hypertension: Secondary | ICD-10-CM | POA: Diagnosis not present

## 2021-10-20 DIAGNOSIS — M79601 Pain in right arm: Secondary | ICD-10-CM | POA: Insufficient documentation

## 2021-10-20 DIAGNOSIS — Z87891 Personal history of nicotine dependence: Secondary | ICD-10-CM | POA: Diagnosis not present

## 2021-10-20 DIAGNOSIS — R0789 Other chest pain: Secondary | ICD-10-CM | POA: Diagnosis not present

## 2021-10-20 DIAGNOSIS — Z7982 Long term (current) use of aspirin: Secondary | ICD-10-CM | POA: Diagnosis not present

## 2021-10-20 DIAGNOSIS — E785 Hyperlipidemia, unspecified: Secondary | ICD-10-CM | POA: Diagnosis not present

## 2021-10-20 LAB — CBC WITH DIFFERENTIAL/PLATELET
Abs Immature Granulocytes: 0.02 10*3/uL (ref 0.00–0.07)
Basophils Absolute: 0 10*3/uL (ref 0.0–0.1)
Basophils Relative: 0 %
Eosinophils Absolute: 0.1 10*3/uL (ref 0.0–0.5)
Eosinophils Relative: 2 %
HCT: 42.1 % (ref 36.0–46.0)
Hemoglobin: 13.8 g/dL (ref 12.0–15.0)
Immature Granulocytes: 0 %
Lymphocytes Relative: 19 %
Lymphs Abs: 1.3 10*3/uL (ref 0.7–4.0)
MCH: 29.9 pg (ref 26.0–34.0)
MCHC: 32.8 g/dL (ref 30.0–36.0)
MCV: 91.3 fL (ref 80.0–100.0)
Monocytes Absolute: 0.6 10*3/uL (ref 0.1–1.0)
Monocytes Relative: 8 %
Neutro Abs: 4.9 10*3/uL (ref 1.7–7.7)
Neutrophils Relative %: 71 %
Platelets: 257 10*3/uL (ref 150–400)
RBC: 4.61 MIL/uL (ref 3.87–5.11)
RDW: 13.7 % (ref 11.5–15.5)
WBC: 6.9 10*3/uL (ref 4.0–10.5)
nRBC: 0 % (ref 0.0–0.2)

## 2021-10-20 LAB — BASIC METABOLIC PANEL
Anion gap: 9 (ref 5–15)
BUN: 21 mg/dL (ref 8–23)
CO2: 31 mmol/L (ref 22–32)
Calcium: 9.7 mg/dL (ref 8.9–10.3)
Chloride: 97 mmol/L — ABNORMAL LOW (ref 98–111)
Creatinine, Ser: 1 mg/dL (ref 0.44–1.00)
GFR, Estimated: 58 mL/min — ABNORMAL LOW (ref >60)
Glucose, Bld: 105 mg/dL — ABNORMAL HIGH (ref 70–99)
Potassium: 3.5 mmol/L (ref 3.5–5.1)
Sodium: 137 mmol/L (ref 135–145)

## 2021-10-20 LAB — TROPONIN I (HIGH SENSITIVITY)
Troponin I (High Sensitivity): 4 ng/L (ref <18)
Troponin I (High Sensitivity): 4 ng/L (ref <18)

## 2021-10-20 NOTE — ED Provider Notes (Signed)
[facility] Emergency Department Provider Note   ____________________________________________   Event Date/Time   First MD Initiated Contact with Patient 10/20/21 1417     (approximate)  I have reviewed the triage vital signs and the nursing notes.   HISTORY  Chief Complaint Chest Pain and Arm Pain    HPI Jennifer Maddox is a 75 y.o. female with past medical history of hypertension, hyperlipidemia, diabetes, and breast cancer who presents to the ED for chest pain.  Patient reports that approximately 1 hour prior to arrival she developed sharp pain in the left side of her chest that radiates towards her right chest and right arm.  Pain was severe at the time of onset and patient felt nauseous with the onset of pain.  She denies any vomiting and has not had any fever, cough, or shortness of breath.  She has not noticed any pain or swelling in her legs.  She called a triage nurse at the onset of pain, was told to take 4 baby aspirin.  She states that the pain seemed to resolve shortly after she took the aspirin.  She now states that she is asymptomatic with no ongoing pain in her chest.  She reports 1 episode of similar pain many years ago, but denies any cardiac history.        Past Medical History:  Diagnosis Date   Anemia    Arthritis    Breast cancer ([place]) 2011/2019   left breast   Breast cancer, left breast ([place]) 2011   COPD (chronic obstructive pulmonary disease) ([place])    Degenerative disc disease, lumbar    Diabetes mellitus without complication ([place])    Diverticulosis    Hyperlipidemia    Hypertension    Personal history of radiation therapy    Sleep apnea    TIA (transient ischemic attack)     Patient Active Problem List   Diagnosis Date Noted   Breast cancer ([place]) 01/17/2019   Goals of care, counseling/discussion 12/08/2018   Fatty liver disease, nonalcoholic 48/12/6551   Heart murmur previously undiagnosed 12/15/2017   Aortic valve  sclerosis 12/15/2017   Breast cancer, left breast ([place]) 02/18/2016   Fecal incontinence 12/31/2014   Irritable bowel syndrome with constipation and diarrhea 12/31/2014   Lumbar radiculitis 06/08/2014   DDD (degenerative disc disease), lumbar 05/21/2014   Dyspnea on exertion 05/17/2014   Edema 05/17/2014   Trochanteric bursitis of left hip 04/19/2014   Anemia 01/23/2014   Diverticulosis 01/23/2014   DM type 2 (diabetes mellitus, type 2) ([place]) 01/23/2014   H/O adenomatous polyp of colon 01/23/2014   HTN (hypertension) 01/23/2014   Hyperlipidemia 01/23/2014   Osteoarthritis 01/23/2014   Other symptoms involving digestive system(787.99) 01/23/2014   Personal history of breast cancer 01/23/2014   Sleep apnea 01/23/2014    Past Surgical History:  Procedure Laterality Date   ABDOMINAL HYSTERECTOMY     around age 72 ; ovaries intact   BREAST BIOPSY Left 12/01/2018   Pathology LEFT breast mass 11 o'clock location: Invasive mammary   BREAST EXCISIONAL BIOPSY Left 2011   breast ca with radation   CHOLECYSTECTOMY     COLONOSCOPY W/ POLYPECTOMY     COLONOSCOPY WITH PROPOFOL N/A 10/29/2017   Procedure: COLONOSCOPY WITH PROPOFOL;  Surgeon: Manya Silvas, MD;  Location: [facility] ENDOSCOPY;  Service: Endoscopy;  Laterality: N/A;   ESOPHAGOGASTRODUODENOSCOPY (EGD) WITH PROPOFOL N/A 10/29/2017   Procedure: ESOPHAGOGASTRODUODENOSCOPY (EGD) WITH PROPOFOL;  Surgeon: Manya Silvas, MD;  Location: [facility]  ENDOSCOPY;  Service: Endoscopy;  Laterality: N/A;   KNEE ARTHROSCOPY W/ MENISCAL REPAIR Right    MASTECTOMY Left 2019   SENTINEL NODE BIOPSY Left 01/17/2019   Procedure: SENTINEL NODE INJECTION;  Surgeon: Benjamine Sprague, DO;  Location: [facility] ORS;  Service: General;  Laterality: Left;   TONSILLECTOMY     TOTAL MASTECTOMY Left 01/17/2019   Procedure: TOTAL MASTECTOMY;  Surgeon: Benjamine Sprague, DO;  Location: [facility] ORS;  Service: General;  Laterality: Left;    Prior to Admission medications   Medication  Sig Start Date End Date Taking? Authorizing Provider  amLODipine (NORVASC) 2.5 MG tablet Take 2.5 mg by mouth daily. 12/15/19  Yes [provider]  anastrozole (ARIMIDEX) 1 MG tablet TAKE 1 TABLET BY MOUTH EVERY DAY 05/19/21  Yes Sindy Guadeloupe, MD  aspirin 325 MG tablet Take 325 mg by mouth daily.   Yes [provider]  furosemide (LASIX) 20 MG tablet Take 20 mg by mouth 2 (two) times daily.  04/22/15  Yes [provider]  gabapentin (NEURONTIN) 300 MG capsule Take 300 mg by mouth at bedtime.  08/07/15  Yes [provider]  glipiZIDE (GLUCOTROL XL) 2.5 MG 24 hr tablet Take 1 tablet by mouth daily. 09/08/19  Yes [provider]  hydrALAZINE (APRESOLINE) 10 MG tablet Take 10 mg by mouth 2 (two) times daily. 09/30/21  Yes [provider]  ibuprofen (ADVIL,MOTRIN) 800 MG tablet Take 1 tablet (800 mg total) by mouth every 8 (eight) hours as needed for mild pain or moderate pain. 01/18/19  Yes Sakai, Isami, DO  MELATONIN PO Take 12 mg by mouth at bedtime.    Yes [provider]  Multiple Vitamins-Minerals (MULTIVITAMIN ADULT PO) Take 1 tablet by mouth daily.    Yes [provider]  phentermine (ADIPEX-P) 37.5 MG tablet Take 37.5 mg by mouth every morning. 10/16/21  Yes [provider]  pioglitazone (ACTOS) 15 MG tablet Take 15 mg by mouth daily. 08/01/21  Yes [provider]  potassium chloride (KLOR-CON) 10 MEQ tablet Take 1 tablet by mouth daily at 12 noon. 04/01/21 04/01/22 Yes [provider]  pravastatin (PRAVACHOL) 40 MG tablet Take 40 mg by mouth daily.  07/29/15  Yes [provider]  Probiotic Product ([employer] PROBIOTIC) CHEW Chew 500 mg by mouth daily at 6 (six) AM.   Yes [provider]  topiramate (TOPAMAX) 25 MG tablet Take 25 mg by mouth daily. 10/16/21  Yes [provider]  traZODone (DESYREL) 100 MG tablet Take 150 mg by mouth at bedtime.  08/07/15  Yes [provider]  blood  glucose meter kit and supplies USE TWICE DAILY 09/27/15   [provider]  clobetasol ointment (TEMOVATE) 1.61 % Apply 1 application topically daily as needed (itching).  10/14/17   [provider]  glucose blood test strip  06/14/14   [provider]  losartan-hydrochlorothiazide (HYZAAR) 100-12.5 MG tablet Take 1 tablet by mouth daily. 11/28/19 12/24/20  [provider]    Allergies Atorvastatin and Naproxen sodium  Family History  Problem Relation Age of Onset   Lung cancer Mother        smoker; deceased 48   Heart attack Father        deceased 87   Melanoma Brother 82       currently 63   Prostate cancer Brother 45       currently 55   Melanoma Son 60       below left armpit; currently  59   Breast cancer Neg Hx     Social History Social History   Tobacco Use   Smoking status: Former    Types: Cigarettes    Quit date: 12/14/1977    Years since quitting: 43.8   Smokeless tobacco: Never  Vaping Use   Vaping Use: Never used  Substance Use Topics   Alcohol use: No   Drug use: No    Review of Systems  Constitutional: No fever/chills Eyes: No visual changes. ENT: No sore throat. Cardiovascular: Positive for chest pain. Respiratory: Denies shortness of breath. Gastrointestinal: No abdominal pain.  Positive for nausea, no vomiting.  No diarrhea.  No constipation. Genitourinary: Negative for dysuria. Musculoskeletal: Negative for back pain. Skin: Negative for rash. Neurological: Negative for headaches, focal weakness or numbness.  ____________________________________________   PHYSICAL EXAM:  VITAL SIGNS: ED Triage Vitals  Enc Vitals Group     BP 10/20/21 1354 119/74     Pulse Rate 10/20/21 1354 69     Resp 10/20/21 1354 18     Temp 10/20/21 1354 98 F (36.7 C)     Temp Source 10/20/21 1354 Oral     SpO2 10/20/21 1354 100 %     Weight --      Height --      Head Circumference --      Peak Flow --      Pain Score 10/20/21  1356 0     Pain Loc --      Pain Edu? --      Excl. in [place]? --     Constitutional: Alert and oriented. Eyes: Conjunctivae are normal. Head: Atraumatic. Nose: No congestion/rhinnorhea. Mouth/Throat: Mucous membranes are moist. Neck: Normal ROM Cardiovascular: Normal rate, regular rhythm. Grossly normal heart sounds.  2+ radial pulses bilaterally. Respiratory: Normal respiratory effort.  No retractions. Lungs CTAB.  No chest wall tenderness to palpation. Gastrointestinal: Soft and nontender. No distention. Genitourinary: deferred Musculoskeletal: No lower extremity tenderness nor edema. Neurologic:  Normal speech and language. No gross focal neurologic deficits are appreciated. Skin:  Skin is warm, dry and intact. No rash noted. Psychiatric: Mood and affect are normal. Speech and behavior are normal.  ____________________________________________   LABS (all labs ordered are listed, but only abnormal results are displayed)  Labs Reviewed  BASIC METABOLIC PANEL - Abnormal; Notable for the following components:      Result Value   Chloride 97 (*)    Glucose, Bld 105 (*)    GFR, Estimated 58 (*)    All other components within normal limits  CBC WITH DIFFERENTIAL/PLATELET  TROPONIN I (HIGH SENSITIVITY)   ____________________________________________  EKG  ED ECG REPORT I, Blake Divine, the attending physician, personally viewed and interpreted this ECG.   Date: 10/20/2021  EKG Time: 14:00  Rate: 64  Rhythm: normal sinus rhythm  Axis: LAD  Intervals: Incomplete LBBB  ST&T Change: None   PROCEDURES  Procedure(s) performed (including Critical Care):  Procedures   ____________________________________________   INITIAL IMPRESSION / ASSESSMENT AND PLAN / ED COURSE      75 year old female with past medical history of hypertension, hyperlipidemia, diabetes, and breast cancer presents to the ED with sudden onset left-sided chest pain radiating towards her right chest  and right arm.  Pain has now resolved, EKG shows no evidence of arrhythmia or ischemia.  Symptoms sound atypical for ACS and I doubt PE or dissection given chest pain has now resolved.  Chest x-ray reviewed by me and shows no infiltrate, edema,  or effusion.  Initial labs are reassuring, troponin within normal limits.  We will check second set of troponin, but if this is within normal limits then she would be appropriate for discharge home with PCP follow-up.  Patient turned over to oncoming provider pending repeat troponin.      ____________________________________________   FINAL CLINICAL IMPRESSION(S) / ED DIAGNOSES  Final diagnoses:  Chest pain, unspecified type     ED Discharge Orders     None        Note:  This document was prepared using Dragon voice recognition software and may include unintentional dictation errors.    Blake Divine, MD 10/20/21 1525

## 2021-10-20 NOTE — Discharge Instructions (Addendum)
Your cardiac markers were negative but you need to return to the ER if you have return of chest pain, shortness of breath or any other concerns otherwise call the cardiology number to get a follow-up appointment.  State that you were seen in the ER and an ER follow-up

## 2021-10-20 NOTE — ED Notes (Signed)
After this RN started this pt's IV and collected blood samples, this pt's daughter who was at bedside stated she was starting to feel "like I'm going to be sick, blood always does this to me". Daughter stated she did not believe she was going to pass out, but laid on the floor stating that the cold floor helps her to typically feel better when she sees blood. RN Claiborne Billings notified of visitor voluntarily and steadily lowering herself to lay on the floor.

## 2021-10-20 NOTE — ED Notes (Signed)
Patient transported to X-ray 

## 2021-10-20 NOTE — ED Triage Notes (Addendum)
Pt presents to ED with c/o of having L sided chest pain that moves to her R arm.   Pt was given 324 of ASA and states she is no longer having pain.   Pt states hypertension other than that no cardiac HX.   Pt states slight nausea and some diaphoresis when episode happened.

## 2021-10-20 NOTE — ED Provider Notes (Signed)
5:52 PM Assumed care for off going team.   Blood pressure 129/73, pulse 69, temperature 98 F (36.7 C), temperature source Oral, resp. rate 19, SpO2 99 %.  See their HPI for full report but in brief pending repeat trop then dc   Repeat troponin was negative.  6:00 PM reevaluate patient continues to have no chest pain.  Family is at bedside as well.  Patient would like to follow-up with cardiologist Dr. Saralyn Pilar from cardiology.  At this time they feel comfortable with discharge home understanding may need an outpatient stress test and return to the ER if develop recurrent pain or any other concerns  I discussed the provisional nature of ED diagnosis, the treatment so far, the ongoing plan of care, follow up appointments and return precautions with the patient and any family or support people present. They expressed understanding and agreed with the plan, discharged home.      Vanessa [place], MD 10/20/21 [phone]

## 2021-10-20 NOTE — ED Notes (Signed)
RN at bedside to check on pt's daughter. Daughter back in chair. Daughter stating she feels better just needed a second. Daughter denies any needs at this time.

## 2021-10-20 NOTE — ED Triage Notes (Signed)
Pt to ED via [facility] with CP PTA. They gave her 324 ASA and CP went away. Denies SOB, N/V or diaphoresis per EMS

## 2021-10-24 DIAGNOSIS — G4733 Obstructive sleep apnea (adult) (pediatric): Secondary | ICD-10-CM | POA: Diagnosis not present

## 2021-10-24 DIAGNOSIS — R079 Chest pain, unspecified: Secondary | ICD-10-CM | POA: Diagnosis not present

## 2021-10-24 DIAGNOSIS — R0609 Other forms of dyspnea: Secondary | ICD-10-CM | POA: Diagnosis not present

## 2021-10-24 DIAGNOSIS — I350 Nonrheumatic aortic (valve) stenosis: Secondary | ICD-10-CM | POA: Diagnosis not present

## 2021-10-24 DIAGNOSIS — E785 Hyperlipidemia, unspecified: Secondary | ICD-10-CM | POA: Diagnosis not present

## 2021-10-24 DIAGNOSIS — E119 Type 2 diabetes mellitus without complications: Secondary | ICD-10-CM | POA: Diagnosis not present

## 2021-10-24 DIAGNOSIS — I10 Essential (primary) hypertension: Secondary | ICD-10-CM | POA: Diagnosis not present

## 2021-10-28 DIAGNOSIS — R0609 Other forms of dyspnea: Secondary | ICD-10-CM | POA: Diagnosis not present

## 2021-10-28 DIAGNOSIS — R079 Chest pain, unspecified: Secondary | ICD-10-CM | POA: Diagnosis not present

## 2021-10-28 DIAGNOSIS — E119 Type 2 diabetes mellitus without complications: Secondary | ICD-10-CM | POA: Diagnosis not present

## 2021-10-28 DIAGNOSIS — R413 Other amnesia: Secondary | ICD-10-CM | POA: Diagnosis not present

## 2021-10-28 DIAGNOSIS — I10 Essential (primary) hypertension: Secondary | ICD-10-CM | POA: Diagnosis not present

## 2021-10-30 DIAGNOSIS — R413 Other amnesia: Secondary | ICD-10-CM | POA: Diagnosis not present

## 2021-10-30 DIAGNOSIS — Z113 Encounter for screening for infections with a predominantly sexual mode of transmission: Secondary | ICD-10-CM | POA: Diagnosis not present

## 2021-11-13 DIAGNOSIS — R413 Other amnesia: Secondary | ICD-10-CM | POA: Diagnosis not present

## 2021-11-13 DIAGNOSIS — Z820 Family history of epilepsy and other diseases of the nervous system: Secondary | ICD-10-CM | POA: Diagnosis not present

## 2021-11-13 DIAGNOSIS — F4321 Adjustment disorder with depressed mood: Secondary | ICD-10-CM | POA: Diagnosis not present

## 2021-11-21 DIAGNOSIS — R079 Chest pain, unspecified: Secondary | ICD-10-CM | POA: Diagnosis not present

## 2021-11-24 ENCOUNTER — Other Ambulatory Visit: Payer: Self-pay | Admitting: Oncology

## 2021-11-26 DIAGNOSIS — R0609 Other forms of dyspnea: Secondary | ICD-10-CM | POA: Diagnosis not present

## 2021-11-26 DIAGNOSIS — E785 Hyperlipidemia, unspecified: Secondary | ICD-10-CM | POA: Diagnosis not present

## 2021-11-26 DIAGNOSIS — I350 Nonrheumatic aortic (valve) stenosis: Secondary | ICD-10-CM | POA: Diagnosis not present

## 2021-11-26 DIAGNOSIS — I10 Essential (primary) hypertension: Secondary | ICD-10-CM | POA: Diagnosis not present

## 2021-11-27 ENCOUNTER — Other Ambulatory Visit: Payer: Self-pay

## 2021-11-27 ENCOUNTER — Ambulatory Visit
Admission: RE | Admit: 2021-11-27 | Discharge: 2021-11-27 | Disposition: A | Discharge status: Home / Self Care | Payer: Medicare HMO | Source: Ambulatory Visit | Attending: Oncology | Admitting: Oncology

## 2021-11-27 DIAGNOSIS — Z08 Encounter for follow-up examination after completed treatment for malignant neoplasm: Secondary | ICD-10-CM | POA: Diagnosis not present

## 2021-11-27 DIAGNOSIS — Z853 Personal history of malignant neoplasm of breast: Secondary | ICD-10-CM | POA: Diagnosis not present

## 2021-11-27 DIAGNOSIS — Z1231 Encounter for screening mammogram for malignant neoplasm of breast: Secondary | ICD-10-CM | POA: Insufficient documentation

## 2021-12-10 DIAGNOSIS — C50412 Malignant neoplasm of upper-outer quadrant of left female breast: Secondary | ICD-10-CM | POA: Diagnosis not present

## 2021-12-10 DIAGNOSIS — Z170 Estrogen receptor positive status [ER+]: Secondary | ICD-10-CM | POA: Diagnosis not present

## 2021-12-11 DIAGNOSIS — Z9012 Acquired absence of left breast and nipple: Secondary | ICD-10-CM | POA: Diagnosis not present

## 2021-12-11 DIAGNOSIS — C50112 Malignant neoplasm of central portion of left female breast: Secondary | ICD-10-CM | POA: Diagnosis not present

## 2021-12-25 ENCOUNTER — Encounter: Payer: Self-pay | Admitting: Oncology

## 2021-12-25 ENCOUNTER — Inpatient Hospital Stay: Payer: Medicare HMO | Attending: Nurse Practitioner | Admitting: Nurse Practitioner

## 2022-01-15 DIAGNOSIS — I152 Hypertension secondary to endocrine disorders: Secondary | ICD-10-CM | POA: Diagnosis not present

## 2022-01-15 DIAGNOSIS — M75111 Incomplete rotator cuff tear or rupture of right shoulder, not specified as traumatic: Secondary | ICD-10-CM | POA: Diagnosis not present

## 2022-01-15 DIAGNOSIS — E1159 Type 2 diabetes mellitus with other circulatory complications: Secondary | ICD-10-CM | POA: Diagnosis not present

## 2022-01-22 DIAGNOSIS — E1169 Type 2 diabetes mellitus with other specified complication: Secondary | ICD-10-CM | POA: Diagnosis not present

## 2022-01-22 DIAGNOSIS — E785 Hyperlipidemia, unspecified: Secondary | ICD-10-CM | POA: Diagnosis not present

## 2022-01-22 DIAGNOSIS — E1159 Type 2 diabetes mellitus with other circulatory complications: Secondary | ICD-10-CM | POA: Diagnosis not present

## 2022-01-22 DIAGNOSIS — E1165 Type 2 diabetes mellitus with hyperglycemia: Secondary | ICD-10-CM | POA: Diagnosis not present

## 2022-01-22 DIAGNOSIS — I152 Hypertension secondary to endocrine disorders: Secondary | ICD-10-CM | POA: Diagnosis not present

## 2022-02-23 DIAGNOSIS — R252 Cramp and spasm: Secondary | ICD-10-CM | POA: Diagnosis not present

## 2022-02-23 DIAGNOSIS — R5381 Other malaise: Secondary | ICD-10-CM | POA: Diagnosis not present

## 2022-02-23 DIAGNOSIS — E1122 Type 2 diabetes mellitus with diabetic chronic kidney disease: Secondary | ICD-10-CM | POA: Diagnosis not present

## 2022-02-23 DIAGNOSIS — R140 Abdominal distension (gaseous): Secondary | ICD-10-CM | POA: Diagnosis not present

## 2022-02-23 DIAGNOSIS — C50412 Malignant neoplasm of upper-outer quadrant of left female breast: Secondary | ICD-10-CM | POA: Diagnosis not present

## 2022-02-23 DIAGNOSIS — N1831 Chronic kidney disease, stage 3a: Secondary | ICD-10-CM | POA: Diagnosis not present

## 2022-02-23 DIAGNOSIS — R202 Paresthesia of skin: Secondary | ICD-10-CM | POA: Diagnosis not present

## 2022-02-23 DIAGNOSIS — E538 Deficiency of other specified B group vitamins: Secondary | ICD-10-CM | POA: Diagnosis not present

## 2022-02-23 DIAGNOSIS — I129 Hypertensive chronic kidney disease with stage 1 through stage 4 chronic kidney disease, or unspecified chronic kidney disease: Secondary | ICD-10-CM | POA: Diagnosis not present

## 2022-03-03 DIAGNOSIS — R5381 Other malaise: Secondary | ICD-10-CM | POA: Diagnosis not present

## 2022-03-03 DIAGNOSIS — N1831 Chronic kidney disease, stage 3a: Secondary | ICD-10-CM | POA: Diagnosis not present

## 2022-03-03 DIAGNOSIS — R5383 Other fatigue: Secondary | ICD-10-CM | POA: Diagnosis not present

## 2022-03-19 DIAGNOSIS — Z9189 Other specified personal risk factors, not elsewhere classified: Secondary | ICD-10-CM | POA: Diagnosis not present

## 2022-03-19 DIAGNOSIS — I10 Essential (primary) hypertension: Secondary | ICD-10-CM | POA: Diagnosis not present

## 2022-03-19 DIAGNOSIS — R5381 Other malaise: Secondary | ICD-10-CM | POA: Diagnosis not present

## 2022-03-19 DIAGNOSIS — R5383 Other fatigue: Secondary | ICD-10-CM | POA: Diagnosis not present

## 2022-03-19 DIAGNOSIS — G4710 Hypersomnia, unspecified: Secondary | ICD-10-CM | POA: Diagnosis not present

## 2022-03-25 DIAGNOSIS — I10 Essential (primary) hypertension: Secondary | ICD-10-CM | POA: Diagnosis not present

## 2022-03-25 DIAGNOSIS — I350 Nonrheumatic aortic (valve) stenosis: Secondary | ICD-10-CM | POA: Diagnosis not present

## 2022-03-25 DIAGNOSIS — E785 Hyperlipidemia, unspecified: Secondary | ICD-10-CM | POA: Diagnosis not present

## 2022-04-10 DIAGNOSIS — I350 Nonrheumatic aortic (valve) stenosis: Secondary | ICD-10-CM | POA: Diagnosis not present

## 2022-04-20 DIAGNOSIS — E119 Type 2 diabetes mellitus without complications: Secondary | ICD-10-CM | POA: Diagnosis not present

## 2022-04-20 DIAGNOSIS — L600 Ingrowing nail: Secondary | ICD-10-CM | POA: Diagnosis not present

## 2022-04-20 DIAGNOSIS — M79675 Pain in left toe(s): Secondary | ICD-10-CM | POA: Diagnosis not present

## 2022-04-20 DIAGNOSIS — M79674 Pain in right toe(s): Secondary | ICD-10-CM | POA: Diagnosis not present

## 2022-04-20 DIAGNOSIS — L851 Acquired keratosis [keratoderma] palmaris et plantaris: Secondary | ICD-10-CM | POA: Diagnosis not present

## 2022-04-20 DIAGNOSIS — B351 Tinea unguium: Secondary | ICD-10-CM | POA: Diagnosis not present

## 2022-04-21 DIAGNOSIS — R0609 Other forms of dyspnea: Secondary | ICD-10-CM | POA: Diagnosis not present

## 2022-04-21 DIAGNOSIS — I350 Nonrheumatic aortic (valve) stenosis: Secondary | ICD-10-CM | POA: Diagnosis not present

## 2022-04-21 DIAGNOSIS — I10 Essential (primary) hypertension: Secondary | ICD-10-CM | POA: Diagnosis not present

## 2022-04-25 ENCOUNTER — Other Ambulatory Visit: Payer: Self-pay | Admitting: Oncology

## 2022-04-29 ENCOUNTER — Other Ambulatory Visit: Payer: Self-pay | Admitting: Nurse Practitioner

## 2022-04-29 DIAGNOSIS — K582 Mixed irritable bowel syndrome: Secondary | ICD-10-CM | POA: Diagnosis not present

## 2022-04-29 DIAGNOSIS — K5900 Constipation, unspecified: Secondary | ICD-10-CM

## 2022-04-29 DIAGNOSIS — K760 Fatty (change of) liver, not elsewhere classified: Secondary | ICD-10-CM | POA: Diagnosis not present

## 2022-04-29 DIAGNOSIS — Z800 Family history of malignant neoplasm of digestive organs: Secondary | ICD-10-CM | POA: Diagnosis not present

## 2022-05-04 DIAGNOSIS — E119 Type 2 diabetes mellitus without complications: Secondary | ICD-10-CM | POA: Diagnosis not present

## 2022-05-04 DIAGNOSIS — I10 Essential (primary) hypertension: Secondary | ICD-10-CM | POA: Diagnosis not present

## 2022-05-04 DIAGNOSIS — M79644 Pain in right finger(s): Secondary | ICD-10-CM | POA: Diagnosis not present

## 2022-05-04 DIAGNOSIS — F32A Depression, unspecified: Secondary | ICD-10-CM | POA: Diagnosis not present

## 2022-05-14 ENCOUNTER — Ambulatory Visit
Admission: RE | Admit: 2022-05-14 | Discharge: 2022-05-14 | Disposition: A | Discharge status: Home / Self Care | Payer: Medicare HMO | Source: Ambulatory Visit | Attending: Nurse Practitioner | Admitting: Nurse Practitioner

## 2022-05-14 DIAGNOSIS — K760 Fatty (change of) liver, not elsewhere classified: Secondary | ICD-10-CM | POA: Insufficient documentation

## 2022-05-14 DIAGNOSIS — K5900 Constipation, unspecified: Secondary | ICD-10-CM | POA: Diagnosis not present

## 2022-05-20 ENCOUNTER — Other Ambulatory Visit: Payer: Self-pay | Admitting: Oncology

## 2022-05-20 DIAGNOSIS — M9902 Segmental and somatic dysfunction of thoracic region: Secondary | ICD-10-CM | POA: Diagnosis not present

## 2022-05-20 DIAGNOSIS — M9904 Segmental and somatic dysfunction of sacral region: Secondary | ICD-10-CM | POA: Diagnosis not present

## 2022-05-20 DIAGNOSIS — M9903 Segmental and somatic dysfunction of lumbar region: Secondary | ICD-10-CM | POA: Diagnosis not present

## 2022-05-20 DIAGNOSIS — M5383 Other specified dorsopathies, cervicothoracic region: Secondary | ICD-10-CM | POA: Diagnosis not present

## 2022-05-20 DIAGNOSIS — M461 Sacroiliitis, not elsewhere classified: Secondary | ICD-10-CM | POA: Diagnosis not present

## 2022-05-20 DIAGNOSIS — M9901 Segmental and somatic dysfunction of cervical region: Secondary | ICD-10-CM | POA: Diagnosis not present

## 2022-05-21 ENCOUNTER — Ambulatory Visit (LOCAL_COMMUNITY_HEALTH_CENTER): Payer: Medicare HMO

## 2022-05-21 DIAGNOSIS — Z719 Counseling, unspecified: Secondary | ICD-10-CM

## 2022-05-21 DIAGNOSIS — Z23 Encounter for immunization: Secondary | ICD-10-CM

## 2022-05-21 NOTE — Progress Notes (Signed)
Patient seen in nurse's clinic today for Hep A and Hep B vaccinations.  PCP recommended patient get vaccinations due to fatty liver disease. Twinrix administered in right deltoid.  Patient tolerated well. VIS provided. [facility] updated, discussion about return appointment for 2nd dose, copy of [facility] provided to patient.

## 2022-05-25 DIAGNOSIS — M9903 Segmental and somatic dysfunction of lumbar region: Secondary | ICD-10-CM | POA: Diagnosis not present

## 2022-05-25 DIAGNOSIS — M9902 Segmental and somatic dysfunction of thoracic region: Secondary | ICD-10-CM | POA: Diagnosis not present

## 2022-05-25 DIAGNOSIS — M9901 Segmental and somatic dysfunction of cervical region: Secondary | ICD-10-CM | POA: Diagnosis not present

## 2022-05-25 DIAGNOSIS — M5383 Other specified dorsopathies, cervicothoracic region: Secondary | ICD-10-CM | POA: Diagnosis not present

## 2022-05-25 DIAGNOSIS — M9904 Segmental and somatic dysfunction of sacral region: Secondary | ICD-10-CM | POA: Diagnosis not present

## 2022-05-25 DIAGNOSIS — M461 Sacroiliitis, not elsewhere classified: Secondary | ICD-10-CM | POA: Diagnosis not present

## 2022-05-28 DIAGNOSIS — E785 Hyperlipidemia, unspecified: Secondary | ICD-10-CM | POA: Diagnosis not present

## 2022-05-28 DIAGNOSIS — I152 Hypertension secondary to endocrine disorders: Secondary | ICD-10-CM | POA: Diagnosis not present

## 2022-05-28 DIAGNOSIS — M9901 Segmental and somatic dysfunction of cervical region: Secondary | ICD-10-CM | POA: Diagnosis not present

## 2022-05-28 DIAGNOSIS — M9903 Segmental and somatic dysfunction of lumbar region: Secondary | ICD-10-CM | POA: Diagnosis not present

## 2022-05-28 DIAGNOSIS — M9902 Segmental and somatic dysfunction of thoracic region: Secondary | ICD-10-CM | POA: Diagnosis not present

## 2022-05-28 DIAGNOSIS — M461 Sacroiliitis, not elsewhere classified: Secondary | ICD-10-CM | POA: Diagnosis not present

## 2022-05-28 DIAGNOSIS — E1159 Type 2 diabetes mellitus with other circulatory complications: Secondary | ICD-10-CM | POA: Diagnosis not present

## 2022-05-28 DIAGNOSIS — M5383 Other specified dorsopathies, cervicothoracic region: Secondary | ICD-10-CM | POA: Diagnosis not present

## 2022-05-28 DIAGNOSIS — E1169 Type 2 diabetes mellitus with other specified complication: Secondary | ICD-10-CM | POA: Diagnosis not present

## 2022-05-28 DIAGNOSIS — M9904 Segmental and somatic dysfunction of sacral region: Secondary | ICD-10-CM | POA: Diagnosis not present

## 2022-05-28 DIAGNOSIS — E1165 Type 2 diabetes mellitus with hyperglycemia: Secondary | ICD-10-CM | POA: Diagnosis not present

## 2022-06-09 DIAGNOSIS — M5383 Other specified dorsopathies, cervicothoracic region: Secondary | ICD-10-CM | POA: Diagnosis not present

## 2022-06-09 DIAGNOSIS — M9903 Segmental and somatic dysfunction of lumbar region: Secondary | ICD-10-CM | POA: Diagnosis not present

## 2022-06-09 DIAGNOSIS — M9901 Segmental and somatic dysfunction of cervical region: Secondary | ICD-10-CM | POA: Diagnosis not present

## 2022-06-09 DIAGNOSIS — M9904 Segmental and somatic dysfunction of sacral region: Secondary | ICD-10-CM | POA: Diagnosis not present

## 2022-06-09 DIAGNOSIS — M461 Sacroiliitis, not elsewhere classified: Secondary | ICD-10-CM | POA: Diagnosis not present

## 2022-06-09 DIAGNOSIS — M9902 Segmental and somatic dysfunction of thoracic region: Secondary | ICD-10-CM | POA: Diagnosis not present

## 2022-06-25 DIAGNOSIS — M13811 Other specified arthritis, right shoulder: Secondary | ICD-10-CM | POA: Diagnosis not present

## 2022-06-25 DIAGNOSIS — M25511 Pain in right shoulder: Secondary | ICD-10-CM | POA: Diagnosis not present

## 2022-06-26 ENCOUNTER — Encounter (INDEPENDENT_AMBULATORY_CARE_PROVIDER_SITE_OTHER): Payer: Medicare HMO | Admitting: Vascular Surgery

## 2022-06-29 ENCOUNTER — Ambulatory Visit: Payer: Medicare HMO

## 2022-06-30 DIAGNOSIS — M25511 Pain in right shoulder: Secondary | ICD-10-CM | POA: Diagnosis not present

## 2022-07-01 DIAGNOSIS — M75101 Unspecified rotator cuff tear or rupture of right shoulder, not specified as traumatic: Secondary | ICD-10-CM | POA: Diagnosis not present

## 2022-07-01 DIAGNOSIS — S42201A Unspecified fracture of upper end of right humerus, initial encounter for closed fracture: Secondary | ICD-10-CM | POA: Diagnosis not present

## 2022-07-14 ENCOUNTER — Encounter (INDEPENDENT_AMBULATORY_CARE_PROVIDER_SITE_OTHER): Payer: Self-pay | Admitting: Nurse Practitioner

## 2022-07-14 ENCOUNTER — Ambulatory Visit (INDEPENDENT_AMBULATORY_CARE_PROVIDER_SITE_OTHER): Payer: Medicare HMO | Admitting: Nurse Practitioner

## 2022-07-14 VITALS — BP 115/73 | HR 59 | Resp 17 | Ht 65.0 in | Wt 215.8 lb

## 2022-07-14 DIAGNOSIS — J449 Chronic obstructive pulmonary disease, unspecified: Secondary | ICD-10-CM | POA: Diagnosis not present

## 2022-07-14 DIAGNOSIS — M7989 Other specified soft tissue disorders: Secondary | ICD-10-CM

## 2022-07-14 DIAGNOSIS — I10 Essential (primary) hypertension: Secondary | ICD-10-CM

## 2022-07-16 DIAGNOSIS — S42201D Unspecified fracture of upper end of right humerus, subsequent encounter for fracture with routine healing: Secondary | ICD-10-CM | POA: Diagnosis not present

## 2022-07-16 DIAGNOSIS — M75101 Unspecified rotator cuff tear or rupture of right shoulder, not specified as traumatic: Secondary | ICD-10-CM | POA: Diagnosis not present

## 2022-07-22 DIAGNOSIS — S42201D Unspecified fracture of upper end of right humerus, subsequent encounter for fracture with routine healing: Secondary | ICD-10-CM | POA: Diagnosis not present

## 2022-07-22 DIAGNOSIS — M75101 Unspecified rotator cuff tear or rupture of right shoulder, not specified as traumatic: Secondary | ICD-10-CM | POA: Diagnosis not present

## 2022-07-24 ENCOUNTER — Encounter
Admission: RE | Disposition: A | Discharge status: Home / Self Care | Payer: Self-pay | Source: Home / Self Care | Attending: Gastroenterology

## 2022-07-24 ENCOUNTER — Ambulatory Visit: Payer: Medicare HMO | Admitting: General Practice

## 2022-07-24 ENCOUNTER — Encounter: Payer: Self-pay | Admitting: Gastroenterology

## 2022-07-24 ENCOUNTER — Ambulatory Visit
Admission: RE | Admit: 2022-07-24 | Discharge: 2022-07-24 | Disposition: A | Discharge status: Home / Self Care | Payer: Medicare HMO | Attending: Gastroenterology | Admitting: Gastroenterology

## 2022-07-24 ENCOUNTER — Other Ambulatory Visit: Payer: Self-pay

## 2022-07-24 DIAGNOSIS — Z86010 Personal history of colonic polyps: Secondary | ICD-10-CM | POA: Insufficient documentation

## 2022-07-24 DIAGNOSIS — I10 Essential (primary) hypertension: Secondary | ICD-10-CM | POA: Diagnosis not present

## 2022-07-24 DIAGNOSIS — Z1211 Encounter for screening for malignant neoplasm of colon: Secondary | ICD-10-CM | POA: Insufficient documentation

## 2022-07-24 DIAGNOSIS — K640 First degree hemorrhoids: Secondary | ICD-10-CM | POA: Insufficient documentation

## 2022-07-24 DIAGNOSIS — E119 Type 2 diabetes mellitus without complications: Secondary | ICD-10-CM | POA: Insufficient documentation

## 2022-07-24 DIAGNOSIS — K649 Unspecified hemorrhoids: Secondary | ICD-10-CM | POA: Diagnosis not present

## 2022-07-24 DIAGNOSIS — Z87891 Personal history of nicotine dependence: Secondary | ICD-10-CM | POA: Diagnosis not present

## 2022-07-24 DIAGNOSIS — K5730 Diverticulosis of large intestine without perforation or abscess without bleeding: Secondary | ICD-10-CM | POA: Diagnosis not present

## 2022-07-24 HISTORY — PX: COLONOSCOPY WITH PROPOFOL: SHX5780

## 2022-07-24 LAB — GLUCOSE, CAPILLARY: Glucose-Capillary: 118 mg/dL — ABNORMAL HIGH (ref 70–99)

## 2022-07-24 SURGERY — COLONOSCOPY WITH PROPOFOL
Anesthesia: General

## 2022-07-24 MED ORDER — PROPOFOL 1000 MG/100ML IV EMUL
INTRAVENOUS | Status: AC
  Filled 2022-07-24: qty 100

## 2022-07-24 MED ORDER — SODIUM CHLORIDE 0.9 % IV SOLN: INTRAVENOUS | Status: DC

## 2022-07-24 MED ORDER — LIDOCAINE HCL (PF) 2 % IJ SOLN
INTRAMUSCULAR | Status: AC
  Filled 2022-07-24: qty 5

## 2022-07-24 MED ORDER — LIDOCAINE HCL (CARDIAC) PF 100 MG/5ML IV SOSY
PREFILLED_SYRINGE | INTRAVENOUS | Status: DC | PRN
  Administered 2022-07-24: 50 mg via INTRAVENOUS

## 2022-07-24 MED ORDER — DEXAMETHASONE SODIUM PHOSPHATE 10 MG/ML IJ SOLN
INTRAMUSCULAR | Status: DC | PRN
  Administered 2022-07-24: 5 mg via INTRAVENOUS

## 2022-07-24 MED ORDER — ONDANSETRON HCL 4 MG/2ML IJ SOLN
INTRAMUSCULAR | Status: DC | PRN
  Administered 2022-07-24: 4 mg via INTRAVENOUS

## 2022-07-24 MED ORDER — SUCCINYLCHOLINE CHLORIDE 200 MG/10ML IV SOSY
PREFILLED_SYRINGE | INTRAVENOUS | Status: DC | PRN
  Administered 2022-07-24: 100 mg via INTRAVENOUS

## 2022-07-24 MED ORDER — PROPOFOL 10 MG/ML IV BOLUS
INTRAVENOUS | Status: DC | PRN
  Administered 2022-07-24: 150 mg via INTRAVENOUS

## 2022-07-24 NOTE — H&P (Signed)
Outpatient short stay form Pre-procedure 07/24/2022  Lesly Rubenstein, MD  Primary Physician: [place] Pink, MD  Reason for visit:  FH colon cancer  History of present illness:    76 y/o lady with history of obesity, hypertension, and DM II here for surveillance colonoscopy. Patient has history of polyps and mother may have had colon cancer. No blood thinners. No significant abdominal surgeries.    Current Facility-Administered Medications:    0.9 %  sodium chloride infusion, , Intravenous, Continuous, Locklear, Hilton Cork, MD, Last Rate: 20 mL/hr at 07/24/22 0854, New Bag at 07/24/22 0854  Medications Prior to Admission  Medication Sig Dispense Refill Last Dose   amLODipine (NORVASC) 2.5 MG tablet Take 2.5 mg by mouth daily.   07/23/2022   anastrozole (ARIMIDEX) 1 MG tablet TAKE 1 TABLET BY MOUTH EVERY DAY 90 tablet 1 07/23/2022   Cetirizine HCl 10 MG CAPS Take by mouth.   07/23/2022   Docusate Sodium (DSS) 250 MG CAPS Take by mouth.   07/23/2022   furosemide (LASIX) 20 MG tablet Take 20 mg by mouth 2 (two) times daily.    07/23/2022   gabapentin (NEURONTIN) 300 MG capsule Take 300 mg by mouth at bedtime.    07/23/2022   hydrALAZINE (APRESOLINE) 10 MG tablet Take 10 mg by mouth 2 (two) times daily.   07/23/2022   losartan-hydrochlorothiazide (HYZAAR) 100-12.5 MG tablet Take 1 tablet by mouth daily.   07/23/2022   MELATONIN PO Take 12 mg by mouth at bedtime.    07/23/2022   Multiple Vitamins-Minerals (MULTIVITAMIN ADULT PO) Take 1 tablet by mouth daily.    07/23/2022   potassium chloride (KLOR-CON M10) 10 MEQ tablet Take 1 tablet by mouth daily.   07/23/2022   acetaminophen (TYLENOL) 500 MG tablet Take by mouth.      blood glucose meter kit and supplies USE TWICE DAILY      clobetasol ointment (TEMOVATE) 2.19 % Apply 1 application topically daily as needed (itching).   1    glucose blood test strip       losartan-hydrochlorothiazide (HYZAAR) 100-12.5 MG tablet Take 1 tablet by mouth daily.       OZEMPIC, 0.25 OR 0.5 MG/DOSE, 2 MG/3ML SOPN SMARTSIG:0.75 Milliliter(s) SUB-Q Once a Week   07/18/2022   pioglitazone (ACTOS) 15 MG tablet Take 15 mg by mouth daily.   07/21/2022   potassium chloride (KLOR-CON) 10 MEQ tablet Take 1 tablet by mouth daily at 12 noon.        Allergies  Allergen Reactions   Atorvastatin Other (See Comments)    Cramps   Naproxen Sodium Other (See Comments)    Head and Neck Puritis     Past Medical History:  Diagnosis Date   Anemia    Arthritis    Breast cancer ([place]) 2011/2019   left breast   Breast cancer, left breast ([place]) 2011   COPD (chronic obstructive pulmonary disease) ([facility])    Degenerative disc disease, lumbar    Diabetes mellitus without complication ([place])    Diverticulosis    Hyperlipidemia    Hypertension    Personal history of radiation therapy    Sleep apnea    TIA (transient ischemic attack)     Review of systems:  Otherwise negative.    Physical Exam  Gen: Alert, oriented. Appears stated age.  HEENT: PERRLA. Lungs: No respiratory distress CV: RRR Abd: soft, benign, no masses Ext: No edema    Planned procedures: Proceed with colonoscopy. The patient understands the nature  of the planned procedure, indications, risks, alternatives and potential complications including but not limited to bleeding, infection, perforation, damage to internal organs and possible oversedation/side effects from anesthesia. The patient agrees and gives consent to proceed.  Please refer to procedure notes for findings, recommendations and patient disposition/instructions.     Lesly Rubenstein, MD [facility] Gastroenterology

## 2022-07-24 NOTE — Transfer of Care (Signed)
Immediate Anesthesia Transfer of Care Note  Patient: Jennifer Maddox  Procedure(s) Performed: COLONOSCOPY WITH PROPOFOL  Patient Location: PACU  Anesthesia Type:General  Level of Consciousness: awake, alert  and oriented  Airway & Oxygen Therapy: Patient Spontanous Breathing  Post-op Assessment: Report given to RN and Post -op Vital signs reviewed and stable  Post vital signs: Reviewed and stable  Last Vitals:  Vitals Value Taken Time  BP 113/99 07/24/22 0947  Temp 36.1 C 07/24/22 0947  Pulse 95 07/24/22 0951  Resp 23 07/24/22 0951  SpO2 93 % 07/24/22 0951  Vitals shown include unvalidated device data.  Last Pain:  Vitals:   07/24/22 0947  TempSrc: Temporal  PainSc: 0-No pain         Complications: No notable events documented.

## 2022-07-24 NOTE — Anesthesia Procedure Notes (Signed)
Procedure Name: Intubation Date/Time: 07/24/2022 9:15 AM  Performed by: Debe Coder, CRNAPre-anesthesia Checklist: Patient identified, Emergency Drugs available, Suction available and Patient being monitored Patient Re-evaluated:Patient Re-evaluated prior to induction Oxygen Delivery Method: Circle system utilized Preoxygenation: Pre-oxygenation with 100% oxygen Induction Type: IV induction and Rapid sequence Laryngoscope Size: McGraph and 3 Grade View: Grade I Tube type: Oral Tube size: 7.0 mm Number of attempts: 1 Airway Equipment and Method: Stylet and Oral airway Placement Confirmation: ETT inserted through vocal cords under direct vision, positive ETCO2 and breath sounds checked- equal and bilateral Secured at: 22 cm Tube secured with: Tape Dental Injury: Teeth and Oropharynx as per pre-operative assessment

## 2022-07-24 NOTE — Op Note (Signed)
[facility] Patient Name: Jennifer Maddox Procedure Date: 07/24/2022 9:03 AM MRN: [id number] Account #: [id number] Date of Birth: Oct 07, 1946 Admit Type: Outpatient Age: 76 Room: [facility] ENDO ROOM 3 Gender: Female Note Status: Finalized Instrument Name: Park Meo [id number] Procedure:             Colonoscopy Indications:           High risk colon cancer surveillance: Personal history                         of colonic polyps Providers:             Andrey Farmer MD, MD Referring MD:          Irven Easterly. Kary Kos, MD (Referring MD) Medicines:             Monitored Anesthesia Care Complications:         No immediate complications. Procedure:             Pre-Anesthesia Assessment:                        - Prior to the procedure, a History and Physical was                         performed, and patient medications and allergies were                         reviewed. The patient is competent. The risks and                         benefits of the procedure and the sedation options and                         risks were discussed with the patient. All questions                         were answered and informed consent was obtained.                         Patient identification and proposed procedure were                         verified by the physician, the nurse, the                         anesthesiologist, the anesthetist and the technician                         in the endoscopy suite. Mental Status Examination:                         alert and oriented. Airway Examination: normal                         oropharyngeal airway and neck mobility. Respiratory                         Examination: clear to auscultation. CV Examination:  normal. Prophylactic Antibiotics: The patient does not                         require prophylactic antibiotics. Prior                         Anticoagulants: The patient has taken no previous                          anticoagulant or antiplatelet agents. ASA Grade                         Assessment: III - A patient with severe systemic                         disease. After reviewing the risks and benefits, the                         patient was deemed in satisfactory condition to                         undergo the procedure. The anesthesia plan was to use                         general anesthesia. Immediately prior to                         administration of medications, the patient was                         re-assessed for adequacy to receive sedatives. The                         heart rate, respiratory rate, oxygen saturations,                         blood pressure, adequacy of pulmonary ventilation, and                         response to care were monitored throughout the                         procedure. The physical status of the patient was                         re-assessed after the procedure.                        After obtaining informed consent, the colonoscope was                         passed under direct vision. Throughout the procedure,                         the patient's blood pressure, pulse, and oxygen                         saturations were monitored continuously. The  Colonoscope was introduced through the anus and                         advanced to the the terminal ileum. The colonoscopy                         was performed without difficulty. The patient                         tolerated the procedure well. The quality of the bowel                         preparation was good. Findings:      The perianal and digital rectal examinations were normal.      The terminal ileum appeared normal.      Many small and large-mouthed diverticula were found in the sigmoid colon       and descending colon.      Internal hemorrhoids were found during retroflexion. The hemorrhoids       were Grade I (internal hemorrhoids that do not  prolapse).      The exam was otherwise without abnormality on direct and retroflexion       views. Impression:            - The examined portion of the ileum was normal.                        - Diverticulosis in the sigmoid colon and in the                         descending colon.                        - Internal hemorrhoids.                        - The examination was otherwise normal on direct and                         retroflexion views.                        - No specimens collected. Recommendation:        - Discharge patient to home.                        - Resume previous diet.                        - Continue present medications.                        - Repeat colonoscopy is not recommended due to current                         age (55 years or older) for surveillance.                        - Return to referring physician as previously  scheduled. Procedure Code(s):     --- Professional ---                        G9201, Colorectal cancer screening; colonoscopy on                         individual at high risk Diagnosis Code(s):     --- Professional ---                        Z86.010, Personal history of colonic polyps                        K64.0, First degree hemorrhoids                        K57.30, Diverticulosis of large intestine without                         perforation or abscess without bleeding CPT copyright 2019 American Medical Association. All rights reserved. The codes documented in this report are preliminary and upon coder review may  be revised to meet current compliance requirements. Andrey Farmer MD, MD 07/24/2022 9:43:19 AM Number of Addenda: 0 Note Initiated On: 07/24/2022 9:03 AM Scope Withdrawal Time: 0 hours 9 minutes 6 seconds  Total Procedure Duration: 0 hours 16 minutes 14 seconds  Estimated Blood Loss:  Estimated blood loss: none.      [facility]

## 2022-07-24 NOTE — Anesthesia Preprocedure Evaluation (Signed)
Anesthesia Evaluation  Patient identified by MRN, date of birth, ID band Patient awake    Reviewed: Allergy & Precautions, NPO status , Patient's Chart, lab work & pertinent test results  History of Anesthesia Complications Negative for: history of anesthetic complications  Airway Mallampati: IV  TM Distance: >3 FB Neck ROM: full    Dental  (+) Teeth Intact   Pulmonary sleep apnea , neg COPD, former smoker,    Pulmonary exam normal        Cardiovascular hypertension, (-) angina(-) Past MI and (-) CABG negative cardio ROS Normal cardiovascular exam     Neuro/Psych TIAnegative psych ROS   GI/Hepatic negative GI ROS, Neg liver ROS,   Endo/Other  negative endocrine ROSdiabetes  Renal/GU      Musculoskeletal   Abdominal   Peds  Hematology negative hematology ROS (+)   Anesthesia Other Findings Past Medical History: No date: Anemia No date: Arthritis 2011/2019: Breast cancer ([place])     Comment:  left breast 2011: Breast cancer, left breast ([place]) No date: COPD (chronic obstructive pulmonary disease) (HCC) No date: Degenerative disc disease, lumbar No date: Diabetes mellitus without complication (HCC) No date: Diverticulosis No date: Hyperlipidemia No date: Hypertension No date: Personal history of radiation therapy No date: Sleep apnea No date: TIA (transient ischemic attack)  Past Surgical History: No date: ABDOMINAL HYSTERECTOMY     Comment:  around age 7 ; ovaries intact 12/01/2018: BREAST BIOPSY; Left     Comment:  Pathology LEFT breast mass 11 o'clock location: Invasive              mammary 2011: BREAST EXCISIONAL BIOPSY; Left     Comment:  breast ca with radation No date: CHOLECYSTECTOMY No date: COLONOSCOPY W/ POLYPECTOMY 10/29/2017: COLONOSCOPY WITH PROPOFOL; N/A     Comment:  Procedure: COLONOSCOPY WITH PROPOFOL;  Surgeon: Manya Silvas, MD;  Location: [facility] ENDOSCOPY;  Service:                Endoscopy;  Laterality: N/A; 10/29/2017: ESOPHAGOGASTRODUODENOSCOPY (EGD) WITH PROPOFOL; N/A     Comment:  Procedure: ESOPHAGOGASTRODUODENOSCOPY (EGD) WITH               PROPOFOL;  Surgeon: Manya Silvas, MD;  Location:               [facility] ENDOSCOPY;  Service: Endoscopy;  Laterality: N/A; No date: KNEE ARTHROSCOPY W/ MENISCAL REPAIR; Right 2019: MASTECTOMY; Left 01/17/2019: SENTINEL NODE BIOPSY; Left     Comment:  Procedure: SENTINEL NODE INJECTION;  Surgeon: Benjamine Sprague, DO;  Location: [facility] ORS;  Service: General;                Laterality: Left; No date: TONSILLECTOMY 01/17/2019: TOTAL MASTECTOMY; Left     Comment:  Procedure: TOTAL MASTECTOMY;  Surgeon: Benjamine Sprague, DO;              Location: [facility] ORS;  Service: General;  Laterality: Left;  BMI    Body Mass Index: 35.45 kg/m      Reproductive/Obstetrics negative OB ROS                             Anesthesia Physical Anesthesia Plan  ASA: 3  Anesthesia Plan: General ETT   Post-op Pain Management:    Induction: Intravenous  PONV Risk Score and Plan: 3 and Dexamethasone, Ondansetron, Midazolam and Treatment may vary due to age or medical condition  Airway Management Planned: Oral ETT  Additional Equipment:   Intra-op Plan:   Post-operative Plan: Extubation in OR  Informed Consent: I have reviewed the patients History and Physical, chart, labs and discussed the procedure including the risks, benefits and alternatives for the proposed anesthesia with the patient or authorized representative who has indicated his/her understanding and acceptance.     Dental Advisory Given  Plan Discussed with: Anesthesiologist, CRNA and Surgeon  Anesthesia Plan Comments: (Patient consented for risks of anesthesia including but not limited to:  - adverse reactions to medications - damage to eyes, teeth, lips or other oral mucosa - nerve damage due to positioning  - sore throat  or hoarseness - Damage to heart, brain, nerves, lungs, other parts of body or loss of life  Patient voiced understanding.)        Anesthesia Quick Evaluation

## 2022-07-24 NOTE — Interval H&P Note (Signed)
History and Physical Interval Note:  07/24/2022 9:14 AM  Jennifer Maddox  has presented today for surgery, with the diagnosis of Constipation.  The various methods of treatment have been discussed with the patient and family. After consideration of risks, benefits and other options for treatment, the patient has consented to  Procedure(s): COLONOSCOPY WITH PROPOFOL (N/A) as a surgical intervention.  The patient's history has been reviewed, patient examined, no change in status, stable for surgery.  I have reviewed the patient's chart and labs.  Questions were answered to the patient's satisfaction.     Lesly Rubenstein  Ok to proceed with colonoscopy

## 2022-07-25 NOTE — Anesthesia Postprocedure Evaluation (Signed)
Anesthesia Post Note  Patient: Jennifer Maddox  Procedure(s) Performed: COLONOSCOPY WITH PROPOFOL  Patient location during evaluation: Endoscopy Anesthesia Type: General Level of consciousness: awake and alert Pain management: pain level controlled Vital Signs Assessment: post-procedure vital signs reviewed and stable Respiratory status: spontaneous breathing, nonlabored ventilation, respiratory function stable and patient connected to nasal cannula oxygen Cardiovascular status: blood pressure returned to baseline and stable Postop Assessment: no apparent nausea or vomiting Anesthetic complications: no   No notable events documented.   Last Vitals:  Vitals:   07/24/22 0953 07/24/22 0957  BP:  (!) 134/93  Pulse:  87  Resp:    Temp:    SpO2: 98% 98%    Last Pain:  Vitals:   07/24/22 0957  TempSrc:   PainSc: 0-No pain                 Dimas Millin

## 2022-07-30 DIAGNOSIS — S42201D Unspecified fracture of upper end of right humerus, subsequent encounter for fracture with routine healing: Secondary | ICD-10-CM | POA: Diagnosis not present

## 2022-08-02 ENCOUNTER — Encounter (INDEPENDENT_AMBULATORY_CARE_PROVIDER_SITE_OTHER): Payer: Self-pay | Admitting: Nurse Practitioner

## 2022-08-02 NOTE — Progress Notes (Signed)
Subjective:    Patient ID: Jennifer Maddox, female    DOB: Oct 16, 1946, 76 y.o.   MRN: [id number] Chief Complaint  Patient presents with   Establish Care    Referred by dr Cena Benton    Jennifer Maddox is a 76 year old female who presents today for a primary care provider in regards to lower extremity swelling and discomfort.  The patient is very active and goes dancing at the senior center.  She notes that the swelling has been ongoing over the last several months.  She denies any open wounds or ulcerations.  It is improved with elevation.  She denies any claudication-like symptoms.  There is no rest pain.    Review of Systems  Cardiovascular:  Positive for leg swelling.  All other systems reviewed and are negative.      Objective:   Physical Exam Vitals reviewed.  HENT:     Head: Normocephalic.  Cardiovascular:     Rate and Rhythm: Normal rate.     Pulses: Normal pulses.  Pulmonary:     Effort: Pulmonary effort is normal.  Musculoskeletal:     Right lower leg: Edema present.     Left lower leg: Edema present.  Skin:    General: Skin is warm and dry.  Neurological:     Mental Status: She is alert and oriented to person, place, and time.  Psychiatric:        Mood and Affect: Mood normal.        Behavior: Behavior normal.        Thought Content: Thought content normal.        Judgment: Judgment normal.     BP 115/73 (BP Location: Right Arm)   Pulse (!) 59   Resp 17   Ht '5\' 5"'  (1.651 m)   Wt 215 lb 12.8 oz (97.9 kg)   BMI 35.91 kg/m   Past Medical History:  Diagnosis Date   Anemia    Arthritis    Breast cancer ([place]) 2011/2019   left breast   Breast cancer, left breast ([place]) 2011   COPD (chronic obstructive pulmonary disease) (HCC)    Degenerative disc disease, lumbar    Diabetes mellitus without complication (HCC)    Diverticulosis    Hyperlipidemia    Hypertension    Personal history of radiation therapy    Sleep apnea    TIA (transient ischemic attack)      Social History   Socioeconomic History   Marital status: Married    Spouse name: Not on file   Number of children: Not on file   Years of education: Not on file   Highest education level: Not on file  Occupational History   Not on file  Tobacco Use   Smoking status: Former    Types: Cigarettes    Quit date: 12/14/1977    Years since quitting: 44.6   Smokeless tobacco: Never  Vaping Use   Vaping Use: Never used  Substance and Sexual Activity   Alcohol use: No   Drug use: No   Sexual activity: Not on file  Other Topics Concern   Not on file  Social History Narrative   Not on file   Social Determinants of Health   Financial Resource Strain: Not on file  Food Insecurity: Not on file  Transportation Needs: Not on file  Physical Activity: Not on file  Stress: Not on file  Social Connections: Not on file  Intimate Partner Violence: Not on file  Past Surgical History:  Procedure Laterality Date   ABDOMINAL HYSTERECTOMY     around age 71 ; ovaries intact   BREAST BIOPSY Left 12/01/2018   Pathology LEFT breast mass 11 o'clock location: Invasive mammary   BREAST EXCISIONAL BIOPSY Left 2011   breast ca with radation   CHOLECYSTECTOMY     COLONOSCOPY W/ POLYPECTOMY     COLONOSCOPY WITH PROPOFOL N/A 10/29/2017   Procedure: COLONOSCOPY WITH PROPOFOL;  Surgeon: Manya Silvas, MD;  Location: [facility] ENDOSCOPY;  Service: Endoscopy;  Laterality: N/A;   COLONOSCOPY WITH PROPOFOL N/A 07/24/2022   Procedure: COLONOSCOPY WITH PROPOFOL;  Surgeon: Lesly Rubenstein, MD;  Location: [facility] ENDOSCOPY;  Service: Endoscopy;  Laterality: N/A;   ESOPHAGOGASTRODUODENOSCOPY (EGD) WITH PROPOFOL N/A 10/29/2017   Procedure: ESOPHAGOGASTRODUODENOSCOPY (EGD) WITH PROPOFOL;  Surgeon: Manya Silvas, MD;  Location: [facility] ENDOSCOPY;  Service: Endoscopy;  Laterality: N/A;   KNEE ARTHROSCOPY W/ MENISCAL REPAIR Right    MASTECTOMY Left 2019   SENTINEL NODE BIOPSY Left 01/17/2019   Procedure:  SENTINEL NODE INJECTION;  Surgeon: Benjamine Sprague, DO;  Location: [facility] ORS;  Service: General;  Laterality: Left;   TONSILLECTOMY     TOTAL MASTECTOMY Left 01/17/2019   Procedure: TOTAL MASTECTOMY;  Surgeon: Benjamine Sprague, DO;  Location: [facility] ORS;  Service: General;  Laterality: Left;    Family History  Problem Relation Age of Onset   Lung cancer Mother        smoker; deceased 49   Heart attack Father        deceased 14   Melanoma Brother 70       currently 76   Prostate cancer Brother 36       currently 74   Melanoma Son 45       below left armpit; currently 43   Breast cancer Neg Hx     Allergies  Allergen Reactions   Atorvastatin Other (See Comments)    Cramps   Naproxen Sodium Other (See Comments)    Head and Neck Puritis       Latest Ref Rng & Units 10/20/2021    2:26 PM 06/23/2021   10:44 AM 12/24/2020    9:49 AM  CBC  WBC 4.0 - 10.5 K/uL 6.9  5.2  5.4   Hemoglobin 12.0 - 15.0 g/dL 13.8  13.3  14.3   Hematocrit 36.0 - 46.0 % 42.1  40.5  41.7   Platelets 150 - 400 K/uL 257  241  238       CMP     Component Value Date/Time   NA 137 10/20/2021 1426   NA 139 12/11/2013 0604   K 3.5 10/20/2021 1426   K 4.2 12/11/2013 0604   CL 97 (L) 10/20/2021 1426   CL 106 12/11/2013 0604   CO2 31 10/20/2021 1426   CO2 26 12/11/2013 0604   GLUCOSE 105 (H) 10/20/2021 1426   GLUCOSE 117 (H) 12/11/2013 0604   BUN 21 10/20/2021 1426   BUN 24 (H) 12/11/2013 0604   CREATININE 1.00 10/20/2021 1426   CREATININE 1.18 02/12/2014 1101   CALCIUM 9.7 10/20/2021 1426   CALCIUM 9.8 12/11/2013 0604   PROT 6.9 06/23/2021 1044   PROT 7.3 02/12/2014 1101   ALBUMIN 4.3 06/23/2021 1044   ALBUMIN 4.0 02/12/2014 1101   AST 18 06/23/2021 1044   AST 13 (L) 02/12/2014 1101   ALT 15 06/23/2021 1044   ALT 23 02/12/2014 1101   ALKPHOS 49 06/23/2021 1044   ALKPHOS 62 02/12/2014 1101  BILITOT 0.6 06/23/2021 1044   BILITOT 0.5 02/12/2014 1101   GFRNONAA 58 (L) 10/20/2021 1426   GFRNONAA 47 (L)  02/12/2014 1101   GFRAA >60 06/24/2020 1304   GFRAA 54 (L) 02/12/2014 1101     No results found.     Assessment & Plan:   1. Leg swelling Recommend:  I have had a long discussion with the patient regarding swelling and why it  causes symptoms.  Patient will begin wearing graduated compression on a daily basis a prescription was given. The patient will  wear the stockings first thing in the morning and removing them in the evening. The patient is instructed specifically not to sleep in the stockings.   In addition, behavioral modification will be initiated.  This will include frequent elevation, use of over the counter pain medications and exercise such as walking.  Consideration for a lymph pump will also be made based upon the effectiveness of conservative therapy.  This would help to improve the edema control and prevent sequela such as ulcers and infections   Patient should undergo duplex ultrasound of the venous system to ensure that DVT or reflux is not present.  The patient will follow-up with me after the ultrasound.    2. Primary hypertension Continue antihypertensive medications as already ordered, these medications have been reviewed and there are no changes at this time.    Current Outpatient Medications on File Prior to Visit  Medication Sig Dispense Refill   acetaminophen (TYLENOL) 500 MG tablet Take by mouth.     amLODipine (NORVASC) 2.5 MG tablet Take 2.5 mg by mouth daily.     anastrozole (ARIMIDEX) 1 MG tablet TAKE 1 TABLET BY MOUTH EVERY DAY 90 tablet 1   blood glucose meter kit and supplies USE TWICE DAILY     Cetirizine HCl 10 MG CAPS Take by mouth.     clobetasol ointment (TEMOVATE) 9.23 % Apply 1 application topically daily as needed (itching).   1   Docusate Sodium (DSS) 250 MG CAPS Take by mouth.     furosemide (LASIX) 20 MG tablet Take 20 mg by mouth 2 (two) times daily.      gabapentin (NEURONTIN) 300 MG capsule Take 300 mg by mouth at bedtime.       glucose blood test strip      hydrALAZINE (APRESOLINE) 10 MG tablet Take 10 mg by mouth 2 (two) times daily.     losartan-hydrochlorothiazide (HYZAAR) 100-12.5 MG tablet Take 1 tablet by mouth daily.     MELATONIN PO Take 12 mg by mouth at bedtime.      Multiple Vitamins-Minerals (MULTIVITAMIN ADULT PO) Take 1 tablet by mouth daily.      OZEMPIC, 0.25 OR 0.5 MG/DOSE, 2 MG/3ML SOPN SMARTSIG:0.75 Milliliter(s) SUB-Q Once a Week     pioglitazone (ACTOS) 15 MG tablet Take 15 mg by mouth daily.     potassium chloride (KLOR-CON M10) 10 MEQ tablet Take 1 tablet by mouth daily.     losartan-hydrochlorothiazide (HYZAAR) 100-12.5 MG tablet Take 1 tablet by mouth daily.     potassium chloride (KLOR-CON) 10 MEQ tablet Take 1 tablet by mouth daily at 12 noon.     No current facility-administered medications on file prior to visit.    There are no Patient Instructions on file for this visit. No follow-ups on file.   Kris Hartmann, NP

## 2022-08-13 ENCOUNTER — Other Ambulatory Visit (INDEPENDENT_AMBULATORY_CARE_PROVIDER_SITE_OTHER): Payer: Self-pay | Admitting: Nurse Practitioner

## 2022-08-13 DIAGNOSIS — M79604 Pain in right leg: Secondary | ICD-10-CM

## 2022-08-13 DIAGNOSIS — R600 Localized edema: Secondary | ICD-10-CM

## 2022-08-14 ENCOUNTER — Ambulatory Visit (INDEPENDENT_AMBULATORY_CARE_PROVIDER_SITE_OTHER): Payer: Medicare HMO | Admitting: Nurse Practitioner

## 2022-08-14 ENCOUNTER — Encounter (INDEPENDENT_AMBULATORY_CARE_PROVIDER_SITE_OTHER): Payer: Medicare HMO

## 2022-08-19 ENCOUNTER — Ambulatory Visit (INDEPENDENT_AMBULATORY_CARE_PROVIDER_SITE_OTHER): Payer: Medicare HMO

## 2022-08-19 DIAGNOSIS — M79604 Pain in right leg: Secondary | ICD-10-CM

## 2022-08-19 DIAGNOSIS — R600 Localized edema: Secondary | ICD-10-CM | POA: Diagnosis not present

## 2022-08-19 DIAGNOSIS — M79605 Pain in left leg: Secondary | ICD-10-CM | POA: Diagnosis not present

## 2022-08-24 ENCOUNTER — Encounter (INDEPENDENT_AMBULATORY_CARE_PROVIDER_SITE_OTHER): Payer: Self-pay | Admitting: Nurse Practitioner

## 2022-08-24 ENCOUNTER — Ambulatory Visit (INDEPENDENT_AMBULATORY_CARE_PROVIDER_SITE_OTHER): Payer: Medicare HMO | Admitting: Nurse Practitioner

## 2022-08-24 VITALS — BP 123/73 | HR 73 | Resp 18 | Ht 64.0 in | Wt 211.6 lb

## 2022-08-24 DIAGNOSIS — I10 Essential (primary) hypertension: Secondary | ICD-10-CM

## 2022-08-24 DIAGNOSIS — M79604 Pain in right leg: Secondary | ICD-10-CM

## 2022-08-24 DIAGNOSIS — M79605 Pain in left leg: Secondary | ICD-10-CM

## 2022-08-24 DIAGNOSIS — I83893 Varicose veins of bilateral lower extremities with other complications: Secondary | ICD-10-CM

## 2022-08-25 ENCOUNTER — Encounter (INDEPENDENT_AMBULATORY_CARE_PROVIDER_SITE_OTHER): Payer: Self-pay | Admitting: Nurse Practitioner

## 2022-08-25 NOTE — Progress Notes (Signed)
Subjective:    Patient ID: Jennifer Maddox, female    DOB: 1946-08-06, 76 y.o.   MRN: [id number] No chief complaint on file.   Jennifer Maddox is a 76 year old female who returns today for follow-up evaluation of lower extremity swelling and discomfort.  The patient does have a history of restless leg for which she takes gabapentin.  The patient notes that the gabapentin recently has not been as helpful.  She notes that the jumping and discomfort in her lower extremities has been worsening.  Patient also notes she does not wear compression consistently.  She has an notable varicosities in her bilateral lower extremities.  The patient underwent noninvasive studies which showed no evidence of DVT or superficial thrombophlebitis bilaterally.  The patient does not have evidence of deep venous insufficiency bilaterally but she does have evidence of reflux in the bilateral great saphenous veins.    Review of Systems  Cardiovascular:  Positive for leg swelling.  All other systems reviewed and are negative.      Objective:   Physical Exam Vitals reviewed.  HENT:     Head: Normocephalic.  Cardiovascular:     Rate and Rhythm: Normal rate.     Pulses: Normal pulses.  Pulmonary:     Effort: Pulmonary effort is normal.  Skin:    General: Skin is warm and dry.  Neurological:     Mental Status: She is alert and oriented to person, place, and time.  Psychiatric:        Mood and Affect: Mood normal.        Behavior: Behavior normal.        Thought Content: Thought content normal.        Judgment: Judgment normal.     BP 123/73 (BP Location: Right Arm)   Pulse 73   Resp 18   Ht '5\' 4"'  (1.626 m)   Wt 211 lb 9.6 oz (96 kg)   BMI 36.32 kg/m   Past Medical History:  Diagnosis Date   Anemia    Arthritis    Breast cancer ([place]) 2011/2019   left breast   Breast cancer, left breast ([place]) 2011   COPD (chronic obstructive pulmonary disease) (HCC)    Degenerative disc disease, lumbar    Diabetes  mellitus without complication (HCC)    Diverticulosis    Hyperlipidemia    Hypertension    Personal history of radiation therapy    Sleep apnea    TIA (transient ischemic attack)     Social History   Socioeconomic History   Marital status: Married    Spouse name: Not on file   Number of children: Not on file   Years of education: Not on file   Highest education level: Not on file  Occupational History   Not on file  Tobacco Use   Smoking status: Former    Types: Cigarettes    Quit date: 12/14/1977    Years since quitting: 44.7   Smokeless tobacco: Never  Vaping Use   Vaping Use: Never used  Substance and Sexual Activity   Alcohol use: No   Drug use: No   Sexual activity: Not on file  Other Topics Concern   Not on file  Social History Narrative   Not on file   Social Determinants of Health   Financial Resource Strain: Not on file  Food Insecurity: Not on file  Transportation Needs: Not on file  Physical Activity: Not on file  Stress: Not on file  Social  Connections: Not on file  Intimate Partner Violence: Not on file    Past Surgical History:  Procedure Laterality Date   ABDOMINAL HYSTERECTOMY     around age 42 ; ovaries intact   BREAST BIOPSY Left 12/01/2018   Pathology LEFT breast mass 11 o'clock location: Invasive mammary   BREAST EXCISIONAL BIOPSY Left 2011   breast ca with radation   CHOLECYSTECTOMY     COLONOSCOPY W/ POLYPECTOMY     COLONOSCOPY WITH PROPOFOL N/A 10/29/2017   Procedure: COLONOSCOPY WITH PROPOFOL;  Surgeon: Manya Silvas, MD;  Location: [facility] ENDOSCOPY;  Service: Endoscopy;  Laterality: N/A;   COLONOSCOPY WITH PROPOFOL N/A 07/24/2022   Procedure: COLONOSCOPY WITH PROPOFOL;  Surgeon: Lesly Rubenstein, MD;  Location: [facility] ENDOSCOPY;  Service: Endoscopy;  Laterality: N/A;   ESOPHAGOGASTRODUODENOSCOPY (EGD) WITH PROPOFOL N/A 10/29/2017   Procedure: ESOPHAGOGASTRODUODENOSCOPY (EGD) WITH PROPOFOL;  Surgeon: Manya Silvas, MD;   Location: [facility] ENDOSCOPY;  Service: Endoscopy;  Laterality: N/A;   KNEE ARTHROSCOPY W/ MENISCAL REPAIR Right    MASTECTOMY Left 2019   SENTINEL NODE BIOPSY Left 01/17/2019   Procedure: SENTINEL NODE INJECTION;  Surgeon: Benjamine Sprague, DO;  Location: [facility] ORS;  Service: General;  Laterality: Left;   TONSILLECTOMY     TOTAL MASTECTOMY Left 01/17/2019   Procedure: TOTAL MASTECTOMY;  Surgeon: Benjamine Sprague, DO;  Location: [facility] ORS;  Service: General;  Laterality: Left;    Family History  Problem Relation Age of Onset   Lung cancer Mother        smoker; deceased 70   Heart attack Father        deceased 64   Melanoma Brother 38       currently 64   Prostate cancer Brother 84       currently 44   Melanoma Son 45       below left armpit; currently 63   Breast cancer Neg Hx     Allergies  Allergen Reactions   Atorvastatin Other (See Comments)    Cramps   Naproxen Sodium Other (See Comments)    Head and Neck Puritis       Latest Ref Rng & Units 10/20/2021    2:26 PM 06/23/2021   10:44 AM 12/24/2020    9:49 AM  CBC  WBC 4.0 - 10.5 K/uL 6.9  5.2  5.4   Hemoglobin 12.0 - 15.0 g/dL 13.8  13.3  14.3   Hematocrit 36.0 - 46.0 % 42.1  40.5  41.7   Platelets 150 - 400 K/uL 257  241  238       CMP     Component Value Date/Time   NA 137 10/20/2021 1426   NA 139 12/11/2013 0604   K 3.5 10/20/2021 1426   K 4.2 12/11/2013 0604   CL 97 (L) 10/20/2021 1426   CL 106 12/11/2013 0604   CO2 31 10/20/2021 1426   CO2 26 12/11/2013 0604   GLUCOSE 105 (H) 10/20/2021 1426   GLUCOSE 117 (H) 12/11/2013 0604   BUN 21 10/20/2021 1426   BUN 24 (H) 12/11/2013 0604   CREATININE 1.00 10/20/2021 1426   CREATININE 1.18 02/12/2014 1101   CALCIUM 9.7 10/20/2021 1426   CALCIUM 9.8 12/11/2013 0604   PROT 6.9 06/23/2021 1044   PROT 7.3 02/12/2014 1101   ALBUMIN 4.3 06/23/2021 1044   ALBUMIN 4.0 02/12/2014 1101   AST 18 06/23/2021 1044   AST 13 (L) 02/12/2014 1101   ALT 15 06/23/2021 1044   ALT 23  02/12/2014 1101   ALKPHOS 49 06/23/2021 1044   ALKPHOS 62 02/12/2014 1101   BILITOT 0.6 06/23/2021 1044   BILITOT 0.5 02/12/2014 1101   GFRNONAA 58 (L) 10/20/2021 1426   GFRNONAA 47 (L) 02/12/2014 1101   GFRAA >60 06/24/2020 1304   GFRAA 54 (L) 02/12/2014 1101     No results found.     Assessment & Plan:   1. Pain in both lower extremities Based on noninvasive studies I suspect that the discomfort in her lower extremities is related to his worsening of her restless leg syndrome.  The varicosities certainly may worsen somewhat but they are not the contributing cause of the restless leg syndrome.  The patient also has known lumbar radiculitis this to could be contributing to the worsening.  Currently the patient is on gabapentin.  We discussed Requip.  Patient is advised to discuss if this may be a good alternative possibility for the patient with her PCP.  2. Primary hypertension Continue antihypertensive medications as already ordered, these medications have been reviewed and there are no changes at this time.   3. Varicose veins of bilateral lower extremities with other complications We discussed possible treatment options of varicosities including conservative therapy versus endovenous laser ablation.  The patient does have underlying restless leg syndrome and the varicosities increase limitation due to inflammation.  We also discussed the possibility that improvement in the swelling may be found.  However following discussion the patient elected to continue with conservative therapy at this time.  We will have her return in 6 months to evaluate progress conservative therapy.  Discussed use of medical grade compression stockings.  They should be worn first thing in the morning and remove before bedtime.  The patient also previously underwent water aerobics.  I discussed how water aerobics is also very helpful in regards for lower extremity edema.   Current Outpatient Medications on File  Prior to Visit  Medication Sig Dispense Refill   acetaminophen (TYLENOL) 500 MG tablet Take by mouth.     amLODipine (NORVASC) 2.5 MG tablet Take 2.5 mg by mouth daily.     anastrozole (ARIMIDEX) 1 MG tablet TAKE 1 TABLET BY MOUTH EVERY DAY 90 tablet 1   blood glucose meter kit and supplies USE TWICE DAILY     Cetirizine HCl 10 MG CAPS Take by mouth.     clobetasol ointment (TEMOVATE) 2.70 % Apply 1 application topically daily as needed (itching).   1   Docusate Sodium (DSS) 250 MG CAPS Take by mouth.     furosemide (LASIX) 20 MG tablet Take 20 mg by mouth 2 (two) times daily.      gabapentin (NEURONTIN) 300 MG capsule Take 300 mg by mouth at bedtime.      glucose blood test strip      hydrALAZINE (APRESOLINE) 10 MG tablet Take 10 mg by mouth 2 (two) times daily.     losartan-hydrochlorothiazide (HYZAAR) 100-12.5 MG tablet Take 1 tablet by mouth daily.     MELATONIN PO Take 12 mg by mouth at bedtime.      Multiple Vitamins-Minerals (MULTIVITAMIN ADULT PO) Take 1 tablet by mouth daily.      pioglitazone (ACTOS) 15 MG tablet Take 15 mg by mouth daily.     potassium chloride (KLOR-CON M10) 10 MEQ tablet Take 1 tablet by mouth daily.     traZODone (DESYREL) 100 MG tablet Take 150 mg by mouth at bedtime.     losartan-hydrochlorothiazide (HYZAAR) 100-12.5 MG tablet Take 1  tablet by mouth daily.     OZEMPIC, 0.25 OR 0.5 MG/DOSE, 2 MG/3ML SOPN SMARTSIG:0.75 Milliliter(s) SUB-Q Once a Week (Patient not taking: Reported on 08/24/2022)     potassium chloride (KLOR-CON) 10 MEQ tablet Take 1 tablet by mouth daily at 12 noon.     No current facility-administered medications on file prior to visit.    There are no Patient Instructions on file for this visit. No follow-ups on file.   Kris Hartmann, NP

## 2022-09-24 DIAGNOSIS — E1165 Type 2 diabetes mellitus with hyperglycemia: Secondary | ICD-10-CM | POA: Diagnosis not present

## 2022-09-24 DIAGNOSIS — I152 Hypertension secondary to endocrine disorders: Secondary | ICD-10-CM | POA: Diagnosis not present

## 2022-09-24 DIAGNOSIS — E1169 Type 2 diabetes mellitus with other specified complication: Secondary | ICD-10-CM | POA: Diagnosis not present

## 2022-09-24 DIAGNOSIS — E785 Hyperlipidemia, unspecified: Secondary | ICD-10-CM | POA: Diagnosis not present

## 2022-09-24 DIAGNOSIS — E1159 Type 2 diabetes mellitus with other circulatory complications: Secondary | ICD-10-CM | POA: Diagnosis not present

## 2022-10-07 DIAGNOSIS — E785 Hyperlipidemia, unspecified: Secondary | ICD-10-CM | POA: Diagnosis not present

## 2022-10-07 DIAGNOSIS — Z0000 Encounter for general adult medical examination without abnormal findings: Secondary | ICD-10-CM | POA: Diagnosis not present

## 2022-10-07 DIAGNOSIS — G4700 Insomnia, unspecified: Secondary | ICD-10-CM | POA: Diagnosis not present

## 2022-10-07 DIAGNOSIS — E119 Type 2 diabetes mellitus without complications: Secondary | ICD-10-CM | POA: Diagnosis not present

## 2022-10-07 DIAGNOSIS — Z1331 Encounter for screening for depression: Secondary | ICD-10-CM | POA: Diagnosis not present

## 2022-10-07 DIAGNOSIS — I10 Essential (primary) hypertension: Secondary | ICD-10-CM | POA: Diagnosis not present

## 2022-10-12 ENCOUNTER — Encounter (INDEPENDENT_AMBULATORY_CARE_PROVIDER_SITE_OTHER): Payer: Self-pay

## 2022-10-14 DIAGNOSIS — E119 Type 2 diabetes mellitus without complications: Secondary | ICD-10-CM | POA: Diagnosis not present

## 2022-10-21 DIAGNOSIS — I350 Nonrheumatic aortic (valve) stenosis: Secondary | ICD-10-CM | POA: Diagnosis not present

## 2022-10-21 DIAGNOSIS — R609 Edema, unspecified: Secondary | ICD-10-CM | POA: Diagnosis not present

## 2022-10-21 DIAGNOSIS — I10 Essential (primary) hypertension: Secondary | ICD-10-CM | POA: Diagnosis not present

## 2022-10-21 DIAGNOSIS — G4733 Obstructive sleep apnea (adult) (pediatric): Secondary | ICD-10-CM | POA: Diagnosis not present

## 2022-10-22 ENCOUNTER — Other Ambulatory Visit: Payer: Self-pay | Admitting: Surgery

## 2022-10-22 DIAGNOSIS — Z1231 Encounter for screening mammogram for malignant neoplasm of breast: Secondary | ICD-10-CM

## 2022-11-09 ENCOUNTER — Encounter (INDEPENDENT_AMBULATORY_CARE_PROVIDER_SITE_OTHER): Payer: Medicare HMO | Admitting: Family Medicine

## 2022-11-19 ENCOUNTER — Other Ambulatory Visit: Payer: Self-pay | Admitting: Oncology

## 2022-11-30 ENCOUNTER — Ambulatory Visit
Admission: RE | Admit: 2022-11-30 | Discharge: 2022-11-30 | Disposition: A | Discharge status: Home / Self Care | Payer: Medicare HMO | Source: Ambulatory Visit | Attending: Surgery | Admitting: Surgery

## 2022-11-30 DIAGNOSIS — Z1231 Encounter for screening mammogram for malignant neoplasm of breast: Secondary | ICD-10-CM | POA: Diagnosis not present

## 2022-12-01 DIAGNOSIS — M25511 Pain in right shoulder: Secondary | ICD-10-CM | POA: Diagnosis not present

## 2022-12-02 DIAGNOSIS — X32XXXA Exposure to sunlight, initial encounter: Secondary | ICD-10-CM | POA: Diagnosis not present

## 2022-12-02 DIAGNOSIS — D0461 Carcinoma in situ of skin of right upper limb, including shoulder: Secondary | ICD-10-CM | POA: Diagnosis not present

## 2022-12-02 DIAGNOSIS — Z85828 Personal history of other malignant neoplasm of skin: Secondary | ICD-10-CM | POA: Diagnosis not present

## 2022-12-02 DIAGNOSIS — D485 Neoplasm of uncertain behavior of skin: Secondary | ICD-10-CM | POA: Diagnosis not present

## 2022-12-02 DIAGNOSIS — L570 Actinic keratosis: Secondary | ICD-10-CM | POA: Diagnosis not present

## 2022-12-02 DIAGNOSIS — D2261 Melanocytic nevi of right upper limb, including shoulder: Secondary | ICD-10-CM | POA: Diagnosis not present

## 2022-12-02 DIAGNOSIS — L900 Lichen sclerosus et atrophicus: Secondary | ICD-10-CM | POA: Diagnosis not present

## 2022-12-02 DIAGNOSIS — D2272 Melanocytic nevi of left lower limb, including hip: Secondary | ICD-10-CM | POA: Diagnosis not present

## 2022-12-15 DIAGNOSIS — Z170 Estrogen receptor positive status [ER+]: Secondary | ICD-10-CM | POA: Diagnosis not present

## 2022-12-15 DIAGNOSIS — C50412 Malignant neoplasm of upper-outer quadrant of left female breast: Secondary | ICD-10-CM | POA: Diagnosis not present

## 2022-12-17 DIAGNOSIS — D0461 Carcinoma in situ of skin of right upper limb, including shoulder: Secondary | ICD-10-CM | POA: Diagnosis not present

## 2022-12-17 DIAGNOSIS — D2361 Other benign neoplasm of skin of right upper limb, including shoulder: Secondary | ICD-10-CM | POA: Diagnosis not present

## 2022-12-17 DIAGNOSIS — L905 Scar conditions and fibrosis of skin: Secondary | ICD-10-CM | POA: Diagnosis not present

## 2023-01-11 DIAGNOSIS — M13811 Other specified arthritis, right shoulder: Secondary | ICD-10-CM | POA: Diagnosis not present

## 2023-02-04 DIAGNOSIS — I152 Hypertension secondary to endocrine disorders: Secondary | ICD-10-CM | POA: Diagnosis not present

## 2023-02-04 DIAGNOSIS — E1169 Type 2 diabetes mellitus with other specified complication: Secondary | ICD-10-CM | POA: Diagnosis not present

## 2023-02-04 DIAGNOSIS — E1165 Type 2 diabetes mellitus with hyperglycemia: Secondary | ICD-10-CM | POA: Diagnosis not present

## 2023-02-04 DIAGNOSIS — E785 Hyperlipidemia, unspecified: Secondary | ICD-10-CM | POA: Diagnosis not present

## 2023-02-04 DIAGNOSIS — E1159 Type 2 diabetes mellitus with other circulatory complications: Secondary | ICD-10-CM | POA: Diagnosis not present

## 2023-02-19 ENCOUNTER — Ambulatory Visit (INDEPENDENT_AMBULATORY_CARE_PROVIDER_SITE_OTHER): Payer: Medicare HMO | Admitting: Vascular Surgery

## 2023-03-01 DIAGNOSIS — M9901 Segmental and somatic dysfunction of cervical region: Secondary | ICD-10-CM | POA: Diagnosis not present

## 2023-03-01 DIAGNOSIS — M9903 Segmental and somatic dysfunction of lumbar region: Secondary | ICD-10-CM | POA: Diagnosis not present

## 2023-03-01 DIAGNOSIS — M9904 Segmental and somatic dysfunction of sacral region: Secondary | ICD-10-CM | POA: Diagnosis not present

## 2023-03-01 DIAGNOSIS — M461 Sacroiliitis, not elsewhere classified: Secondary | ICD-10-CM | POA: Diagnosis not present

## 2023-03-01 DIAGNOSIS — M5383 Other specified dorsopathies, cervicothoracic region: Secondary | ICD-10-CM | POA: Diagnosis not present

## 2023-03-01 DIAGNOSIS — M9902 Segmental and somatic dysfunction of thoracic region: Secondary | ICD-10-CM | POA: Diagnosis not present

## 2023-03-04 DIAGNOSIS — K5900 Constipation, unspecified: Secondary | ICD-10-CM | POA: Diagnosis not present

## 2023-03-04 DIAGNOSIS — R5381 Other malaise: Secondary | ICD-10-CM | POA: Diagnosis not present

## 2023-03-04 DIAGNOSIS — N1831 Chronic kidney disease, stage 3a: Secondary | ICD-10-CM | POA: Diagnosis not present

## 2023-03-04 DIAGNOSIS — I10 Essential (primary) hypertension: Secondary | ICD-10-CM | POA: Diagnosis not present

## 2023-03-04 DIAGNOSIS — R0609 Other forms of dyspnea: Secondary | ICD-10-CM | POA: Diagnosis not present

## 2023-03-04 DIAGNOSIS — Z9189 Other specified personal risk factors, not elsewhere classified: Secondary | ICD-10-CM | POA: Diagnosis not present

## 2023-03-04 DIAGNOSIS — R5383 Other fatigue: Secondary | ICD-10-CM | POA: Diagnosis not present

## 2023-03-22 DIAGNOSIS — M5383 Other specified dorsopathies, cervicothoracic region: Secondary | ICD-10-CM | POA: Diagnosis not present

## 2023-03-22 DIAGNOSIS — M461 Sacroiliitis, not elsewhere classified: Secondary | ICD-10-CM | POA: Diagnosis not present

## 2023-03-22 DIAGNOSIS — M9902 Segmental and somatic dysfunction of thoracic region: Secondary | ICD-10-CM | POA: Diagnosis not present

## 2023-03-22 DIAGNOSIS — M9904 Segmental and somatic dysfunction of sacral region: Secondary | ICD-10-CM | POA: Diagnosis not present

## 2023-03-22 DIAGNOSIS — M9901 Segmental and somatic dysfunction of cervical region: Secondary | ICD-10-CM | POA: Diagnosis not present

## 2023-03-22 DIAGNOSIS — M9903 Segmental and somatic dysfunction of lumbar region: Secondary | ICD-10-CM | POA: Diagnosis not present

## 2023-04-08 DIAGNOSIS — R0609 Other forms of dyspnea: Secondary | ICD-10-CM | POA: Diagnosis not present

## 2023-04-08 DIAGNOSIS — I10 Essential (primary) hypertension: Secondary | ICD-10-CM | POA: Diagnosis not present

## 2023-04-08 DIAGNOSIS — Z0000 Encounter for general adult medical examination without abnormal findings: Secondary | ICD-10-CM | POA: Diagnosis not present

## 2023-04-08 DIAGNOSIS — E119 Type 2 diabetes mellitus without complications: Secondary | ICD-10-CM | POA: Diagnosis not present

## 2023-05-03 DIAGNOSIS — M25511 Pain in right shoulder: Secondary | ICD-10-CM | POA: Diagnosis not present

## 2023-05-03 DIAGNOSIS — M13811 Other specified arthritis, right shoulder: Secondary | ICD-10-CM | POA: Diagnosis not present

## 2023-05-12 DIAGNOSIS — M9903 Segmental and somatic dysfunction of lumbar region: Secondary | ICD-10-CM | POA: Diagnosis not present

## 2023-05-12 DIAGNOSIS — M461 Sacroiliitis, not elsewhere classified: Secondary | ICD-10-CM | POA: Diagnosis not present

## 2023-05-12 DIAGNOSIS — M9902 Segmental and somatic dysfunction of thoracic region: Secondary | ICD-10-CM | POA: Diagnosis not present

## 2023-05-12 DIAGNOSIS — M9901 Segmental and somatic dysfunction of cervical region: Secondary | ICD-10-CM | POA: Diagnosis not present

## 2023-05-12 DIAGNOSIS — M5383 Other specified dorsopathies, cervicothoracic region: Secondary | ICD-10-CM | POA: Diagnosis not present

## 2023-05-12 DIAGNOSIS — M9904 Segmental and somatic dysfunction of sacral region: Secondary | ICD-10-CM | POA: Diagnosis not present

## 2023-06-10 ENCOUNTER — Other Ambulatory Visit: Payer: Self-pay | Admitting: Oncology

## 2023-06-16 ENCOUNTER — Telehealth: Payer: Self-pay | Admitting: Oncology

## 2023-06-16 NOTE — Telephone Encounter (Signed)
Patient left a voicemail requesting appointment with Dr. Smith Robert. She last saw her 06/23/21 with 6 month f/u with app. She no showed the 6 month follow up and hasn't been seen since. Please advise reschedule for this patient.   Thank you

## 2023-06-18 ENCOUNTER — Other Ambulatory Visit: Payer: Self-pay | Admitting: *Deleted

## 2023-06-18 MED ORDER — ANASTROZOLE 1 MG PO TABS: 1.0000 mg | ORAL_TABLET | Freq: Every day | ORAL | 0 refills | Status: DC

## 2023-06-25 ENCOUNTER — Encounter: Payer: Self-pay | Admitting: Oncology

## 2023-06-25 ENCOUNTER — Inpatient Hospital Stay: Payer: Medicare HMO | Attending: Oncology | Admitting: Oncology

## 2023-06-25 VITALS — BP 105/52 | HR 77 | Temp 96.9°F | Resp 20 | Wt 209.9 lb

## 2023-06-25 DIAGNOSIS — C50211 Malignant neoplasm of upper-inner quadrant of right female breast: Secondary | ICD-10-CM | POA: Diagnosis not present

## 2023-06-25 DIAGNOSIS — Z808 Family history of malignant neoplasm of other organs or systems: Secondary | ICD-10-CM | POA: Diagnosis not present

## 2023-06-25 DIAGNOSIS — Z8042 Family history of malignant neoplasm of prostate: Secondary | ICD-10-CM | POA: Diagnosis not present

## 2023-06-25 DIAGNOSIS — Z9049 Acquired absence of other specified parts of digestive tract: Secondary | ICD-10-CM | POA: Insufficient documentation

## 2023-06-25 DIAGNOSIS — M85852 Other specified disorders of bone density and structure, left thigh: Secondary | ICD-10-CM | POA: Diagnosis not present

## 2023-06-25 DIAGNOSIS — Z853 Personal history of malignant neoplasm of breast: Secondary | ICD-10-CM | POA: Diagnosis not present

## 2023-06-25 DIAGNOSIS — C50912 Malignant neoplasm of unspecified site of left female breast: Secondary | ICD-10-CM

## 2023-06-25 DIAGNOSIS — Z90710 Acquired absence of both cervix and uterus: Secondary | ICD-10-CM | POA: Insufficient documentation

## 2023-06-25 DIAGNOSIS — Z87891 Personal history of nicotine dependence: Secondary | ICD-10-CM | POA: Insufficient documentation

## 2023-06-25 DIAGNOSIS — Z170 Estrogen receptor positive status [ER+]: Secondary | ICD-10-CM | POA: Diagnosis not present

## 2023-06-25 DIAGNOSIS — R0789 Other chest pain: Secondary | ICD-10-CM | POA: Diagnosis not present

## 2023-06-25 DIAGNOSIS — Z923 Personal history of irradiation: Secondary | ICD-10-CM | POA: Insufficient documentation

## 2023-06-25 DIAGNOSIS — R0781 Pleurodynia: Secondary | ICD-10-CM | POA: Insufficient documentation

## 2023-06-25 DIAGNOSIS — Z5181 Encounter for therapeutic drug level monitoring: Secondary | ICD-10-CM

## 2023-06-25 DIAGNOSIS — Z8673 Personal history of transient ischemic attack (TIA), and cerebral infarction without residual deficits: Secondary | ICD-10-CM | POA: Insufficient documentation

## 2023-06-25 DIAGNOSIS — Z79899 Other long term (current) drug therapy: Secondary | ICD-10-CM | POA: Insufficient documentation

## 2023-06-25 DIAGNOSIS — Z79811 Long term (current) use of aromatase inhibitors: Secondary | ICD-10-CM | POA: Diagnosis not present

## 2023-06-25 DIAGNOSIS — Z8249 Family history of ischemic heart disease and other diseases of the circulatory system: Secondary | ICD-10-CM | POA: Insufficient documentation

## 2023-06-25 DIAGNOSIS — Z9012 Acquired absence of left breast and nipple: Secondary | ICD-10-CM | POA: Insufficient documentation

## 2023-06-25 DIAGNOSIS — Z801 Family history of malignant neoplasm of trachea, bronchus and lung: Secondary | ICD-10-CM | POA: Insufficient documentation

## 2023-06-26 MED ORDER — ANASTROZOLE 1 MG PO TABS: 1.0000 mg | ORAL_TABLET | Freq: Every day | ORAL | 3 refills | Status: DC

## 2023-06-26 NOTE — Progress Notes (Signed)
Hematology/Oncology Consult note [facility]  [phone] [phone]  Patient Care Team: Jerl Mina, MD as PCP - General (Family Medicine)   Name of the patient: Jennifer Maddox  [id number]  [birth year]   Date of visit: 06/26/23  Diagnosis- invasive mammary carcinoma of the left breast pathological prognostic stage IA pT1 ppN0 cM0 ER PR positive and HER-2/neu negative status post mastectomy   Chief complaint/ Reason for visit- routine f/u of breast cancer  Heme/Onc history: patient is a 77 year old female who was diagnosed with stage I right breast cancer in 2011.  It was a T1b N0 M0 stage I tumor s/p lumpectomy.  Oncotype DX score was 7 and she did not require adjuvant chemotherapy.  She did complete adjuvant radiation and 5 years of hormone therapy.  Mammogram and ultrasound on 11/24/2018 showed irregular hypoechoic mass in the left breast measuring 5 x 4 x 4 mm.  No enlarged or morphologically abnormal lymph nodes in the left axilla.  Patient underwent biopsy which showed invasive mammary carcinoma, grade 2, ER greater than 90% positive, PR greater than 90% positive.  HER-2 was equivocal by IHC.  FISH testing negative   Patient underwent left mastectomy on 12/01/2018.  Final pathology showed 6 mm, grade 2 invasive mammary carcinoma with negative margins.  ER PR positive and HER-2/neu negative.  3 sentinel and one non-sentinel lymph node was negative for malignancy.  Oncotype testing came back with intermediate risk score of 18 and given her age patient does not require any adjuvant chemotherapy.  She did not require postmastectomy radiation and is currently on Arimidex since March 2020.      Interval history-patient ran out of her Arimidex about 2 weeks ago.  She is complaining of left chest wall pain which has been ongoing for the last 3 weeks without any apparent trauma.  Denies any breast concerns  ECOG PS- 1 Pain scale- 0   Review of  systems- Review of Systems  Constitutional:  Negative for chills, fever, malaise/fatigue and weight loss.  HENT:  Negative for congestion, ear discharge and nosebleeds.   Eyes:  Negative for blurred vision.  Respiratory:  Negative for cough, hemoptysis, sputum production, shortness of breath and wheezing.   Cardiovascular:  Negative for chest pain, palpitations, orthopnea and claudication.  Gastrointestinal:  Negative for abdominal pain, blood in stool, constipation, diarrhea, heartburn, melena, nausea and vomiting.  Genitourinary:  Negative for dysuria, flank pain, frequency, hematuria and urgency.  Musculoskeletal:  Negative for back pain, joint pain and myalgias.  Skin:  Negative for rash.  Neurological:  Negative for dizziness, tingling, focal weakness, seizures, weakness and headaches.  Endo/Heme/Allergies:  Does not bruise/bleed easily.  Psychiatric/Behavioral:  Negative for depression and suicidal ideas. The patient does not have insomnia.       Allergies  Allergen Reactions   Atorvastatin Other (See Comments)    Cramps   Naproxen Sodium Other (See Comments)    Head and Neck Puritis     Past Medical History:  Diagnosis Date   Anemia    Arthritis    Breast cancer (HCC) 2011/2019   left breast   Breast cancer, left breast (HCC) 2011   COPD (chronic obstructive pulmonary disease) (HCC)    Degenerative disc disease, lumbar    Diabetes mellitus without complication (HCC)    Diverticulosis    Hyperlipidemia    Hypertension    Personal history of radiation therapy    Sleep apnea    TIA (transient ischemic  attack)      Past Surgical History:  Procedure Laterality Date   ABDOMINAL HYSTERECTOMY     around age 61 ; ovaries intact   BREAST BIOPSY Left 12/01/2018   Pathology LEFT breast mass 11 o'clock location: Invasive mammary   BREAST EXCISIONAL BIOPSY Left 2011   breast ca with radation   CHOLECYSTECTOMY     COLONOSCOPY W/ POLYPECTOMY     COLONOSCOPY WITH PROPOFOL  N/A 10/29/2017   Procedure: COLONOSCOPY WITH PROPOFOL;  Surgeon: Scot Jun, MD;  Location[facility] ENDOSCOPY;  Service: Endoscopy;  Laterality: N/A;   COLONOSCOPY WITH PROPOFOL N/A 07/24/2022   Procedure: COLONOSCOPY WITH PROPOFOL;  Surgeon: Regis Bill, MD;  Location: [facility] ENDOSCOPY;  Service: Endoscopy;  Laterality: N/A;   ESOPHAGOGASTRODUODENOSCOPY (EGD) WITH PROPOFOL N/A 10/29/2017   Procedure: ESOPHAGOGASTRODUODENOSCOPY (EGD) WITH PROPOFOL;  Surgeon: Scot Jun, MD;  Location: [facility] ENDOSCOPY;  Service: Endoscopy;  Laterality: N/A;   KNEE ARTHROSCOPY W/ MENISCAL REPAIR Right    MASTECTOMY Left 2019   SENTINEL NODE BIOPSY Left 01/17/2019   Procedure: SENTINEL NODE INJECTION;  Surgeon: Sung Amabile, DO;  Location: [facility] ORS;  Service: General;  Laterality: Left;   TONSILLECTOMY     TOTAL MASTECTOMY Left 01/17/2019   Procedure: TOTAL MASTECTOMY;  Surgeon: Sung Amabile, DO;  Location: [facility] ORS;  Service: General;  Laterality: Left;    Social History   Socioeconomic History   Marital status: Married    Spouse name: Not on file   Number of children: Not on file   Years of education: Not on file   Highest education level: Not on file  Occupational History   Not on file  Tobacco Use   Smoking status: Former    Current packs/day: 0.00    Types: Cigarettes    Quit date: 12/14/1977    Years since quitting: 45.5   Smokeless tobacco: Never  Vaping Use   Vaping status: Never Used  Substance and Sexual Activity   Alcohol use: No   Drug use: No   Sexual activity: Not on file  Other Topics Concern   Not on file  Social History Narrative   Not on file   Social Determinants of Health   Financial Resource Strain: Low Risk  (10/07/2022)   Received from [facility], [facility]   Overall Financial Resource Strain (CARDIA)    Difficulty of Paying Living Expenses: Not very hard  Food Insecurity: No Food Insecurity (10/07/2022)   Received  from [facility], [facility]   Hunger Vital Sign    Worried About Running Out of Food in the Last Year: Never true    Ran Out of Food in the Last Year: Never true  Transportation Needs: No Transportation Needs (10/07/2022)   Received from [facility], [facility]   [facility] - Transportation    In the past 12 months, has lack of transportation kept you from medical appointments or from getting medications?: No    Lack of Transportation (Non-Medical): No  Physical Activity: Not on file  Stress: Not on file  Social Connections: Not on file  Intimate Partner Violence: Not on file    Family History  Problem Relation Age of Onset   Lung cancer Mother        smoker; deceased 70   Heart attack Father        deceased 41   Melanoma Brother 51       currently  77   Prostate cancer Brother 40       currently 53   Melanoma Son 45       below left armpit; currently 49   Breast cancer Neg Hx      Current Outpatient Medications:    acetaminophen (TYLENOL) 500 MG tablet, Take by mouth., Disp: , Rfl:    amLODipine (NORVASC) 2.5 MG tablet, Take 2.5 mg by mouth daily., Disp: , Rfl:    anastrozole (ARIMIDEX) 1 MG tablet, Take 1 tablet (1 mg total) by mouth daily., Disp: 30 tablet, Rfl: 0   blood glucose meter kit and supplies, USE TWICE DAILY, Disp: , Rfl:    Cetirizine HCl 10 MG CAPS, Take by mouth., Disp: , Rfl:    clobetasol ointment (TEMOVATE) 0.05 %, Apply 1 application topically daily as needed (itching). , Disp: , Rfl: 1   Docusate Sodium (DSS) 250 MG CAPS, Take by mouth., Disp: , Rfl:    furosemide (LASIX) 20 MG tablet, Take 20 mg by mouth 2 (two) times daily. , Disp: , Rfl:    gabapentin (NEURONTIN) 300 MG capsule, Take 300 mg by mouth at bedtime. , Disp: , Rfl:    glucose blood test strip, , Disp: , Rfl:    hydrALAZINE (APRESOLINE) 10 MG tablet, Take 10 mg by mouth 2 (two) times daily., Disp: , Rfl:     losartan-hydrochlorothiazide (HYZAAR) 100-12.5 MG tablet, Take 1 tablet by mouth daily., Disp: , Rfl:    losartan-hydrochlorothiazide (HYZAAR) 100-12.5 MG tablet, Take 1 tablet by mouth daily., Disp: , Rfl:    MELATONIN PO, Take 12 mg by mouth at bedtime. , Disp: , Rfl:    Multiple Vitamins-Minerals (MULTIVITAMIN ADULT PO), Take 1 tablet by mouth daily. , Disp: , Rfl:    pioglitazone (ACTOS) 15 MG tablet, Take 15 mg by mouth daily., Disp: , Rfl:    potassium chloride (KLOR-CON M10) 10 MEQ tablet, Take 1 tablet by mouth daily., Disp: , Rfl:    traZODone (DESYREL) 100 MG tablet, Take 150 mg by mouth at bedtime., Disp: , Rfl:    OZEMPIC, 0.25 OR 0.5 MG/DOSE, 2 MG/3ML SOPN, SMARTSIG:0.75 Milliliter(s) SUB-Q Once a Week (Patient not taking: Reported on 08/24/2022), Disp: , Rfl:    potassium chloride (KLOR-CON) 10 MEQ tablet, Take 1 tablet by mouth daily at 12 noon. (Patient not taking: Reported on 06/25/2023), Disp: , Rfl:   Physical exam:  Vitals:   06/25/23 1454  BP: (!) 105/52  Pulse: 77  Resp: 20  Temp: (!) 96.9 F (36.1 C)  SpO2: 100%  Weight: 209 lb 14.4 oz (95.2 kg)   Physical Exam Cardiovascular:     Rate and Rhythm: Normal rate and regular rhythm.     Heart sounds: Normal heart sounds.  Pulmonary:     Effort: Pulmonary effort is normal.     Breath sounds: Normal breath sounds.  Abdominal:     General: Bowel sounds are normal.     Palpations: Abdomen is soft.  Skin:    General: Skin is warm and dry.  Neurological:     Mental Status: She is alert and oriented to person, place, and time.   Breast exam was performed in seated and lying down position. Patient is status post right lumpectomy with a well-healed surgical scar. No evidence of any palpable masses. No evidence of axillary adenopathy. No evidence of any palpable masses or lumps in the left breast. No evidence of leftt axillary adenopathy       Latest Ref  Rng & Units 10/20/2021    2:26 PM  CMP  Glucose 70 - 99 mg/dL  409   BUN 8 - 23 mg/dL 21   Creatinine 8.11 - 1.00 mg/dL 9.14   Sodium 782 - 956 mmol/L 137   Potassium 3.5 - 5.1 mmol/L 3.5   Chloride 98 - 111 mmol/L 97   CO2 22 - 32 mmol/L 31   Calcium 8.9 - 10.3 mg/dL 9.7       Latest Ref Rng & Units 10/20/2021    2:26 PM  CBC  WBC 4.0 - 10.5 K/uL 6.9   Hemoglobin 12.0 - 15.0 g/dL 21.3   Hematocrit 08.6 - 46.0 % 42.1   Platelets 150 - 400 K/uL 257     No images are attached to the encounter.  No results found.   Assessment and plan- Patient is a 77 y.o. female with history of stage I ER/PR positive HER2 negative right breast cancer status post mastectomy and adjuvant radiation presently on Arimidex and this is a routine follow-up visit  Patient is complaining of left chest wall pain without any apparent trauma.  This could be postmastectomy pain however I am getting a bone scan to rule out any metastatic disease.  I am also refilling her anastrozole today.  I will see her back in 6 months no labs with a bone density scan prior.   Visit Diagnosis 1. Encounter for follow-up surveillance of breast cancer   2. High risk medication use   3. Rib pain on left side   4. Visit for monitoring Arimidex therapy   5. Osteopenia of neck of left femur      Dr. Owens Shark, MD, MPH [facility] at [facility] [phone] 06/26/2023 10:44 AM

## 2023-07-01 DIAGNOSIS — D2261 Melanocytic nevi of right upper limb, including shoulder: Secondary | ICD-10-CM | POA: Diagnosis not present

## 2023-07-01 DIAGNOSIS — Z872 Personal history of diseases of the skin and subcutaneous tissue: Secondary | ICD-10-CM | POA: Diagnosis not present

## 2023-07-01 DIAGNOSIS — L538 Other specified erythematous conditions: Secondary | ICD-10-CM | POA: Diagnosis not present

## 2023-07-01 DIAGNOSIS — Z85828 Personal history of other malignant neoplasm of skin: Secondary | ICD-10-CM | POA: Diagnosis not present

## 2023-07-01 DIAGNOSIS — L820 Inflamed seborrheic keratosis: Secondary | ICD-10-CM | POA: Diagnosis not present

## 2023-07-01 DIAGNOSIS — D2262 Melanocytic nevi of left upper limb, including shoulder: Secondary | ICD-10-CM | POA: Diagnosis not present

## 2023-07-01 DIAGNOSIS — D692 Other nonthrombocytopenic purpura: Secondary | ICD-10-CM | POA: Diagnosis not present

## 2023-07-01 DIAGNOSIS — D225 Melanocytic nevi of trunk: Secondary | ICD-10-CM | POA: Diagnosis not present

## 2023-07-05 ENCOUNTER — Encounter
Admission: RE | Admit: 2023-07-05 | Discharge: 2023-07-05 | Disposition: A | Discharge status: Home / Self Care | Payer: Medicare HMO | Source: Ambulatory Visit | Attending: Oncology | Admitting: Oncology

## 2023-07-05 DIAGNOSIS — Z08 Encounter for follow-up examination after completed treatment for malignant neoplasm: Secondary | ICD-10-CM | POA: Insufficient documentation

## 2023-07-05 DIAGNOSIS — R0781 Pleurodynia: Secondary | ICD-10-CM | POA: Diagnosis not present

## 2023-07-05 DIAGNOSIS — C50919 Malignant neoplasm of unspecified site of unspecified female breast: Secondary | ICD-10-CM | POA: Diagnosis not present

## 2023-07-05 DIAGNOSIS — Z79899 Other long term (current) drug therapy: Secondary | ICD-10-CM | POA: Diagnosis not present

## 2023-07-05 DIAGNOSIS — Z853 Personal history of malignant neoplasm of breast: Secondary | ICD-10-CM | POA: Insufficient documentation

## 2023-07-05 MED ORDER — TECHNETIUM TC 99M MEDRONATE IV KIT
20.0000 | PACK | Freq: Once | INTRAVENOUS | Status: AC | PRN
  Administered 2023-07-05: 20.54 via INTRAVENOUS

## 2023-07-12 ENCOUNTER — Encounter: Payer: Self-pay | Admitting: Oncology

## 2023-07-22 DIAGNOSIS — M50321 Other cervical disc degeneration at C4-C5 level: Secondary | ICD-10-CM | POA: Diagnosis not present

## 2023-07-22 DIAGNOSIS — M47812 Spondylosis without myelopathy or radiculopathy, cervical region: Secondary | ICD-10-CM | POA: Diagnosis not present

## 2023-07-26 DIAGNOSIS — M5033 Other cervical disc degeneration, cervicothoracic region: Secondary | ICD-10-CM | POA: Diagnosis not present

## 2023-07-26 DIAGNOSIS — M62838 Other muscle spasm: Secondary | ICD-10-CM | POA: Diagnosis not present

## 2023-07-26 DIAGNOSIS — M47812 Spondylosis without myelopathy or radiculopathy, cervical region: Secondary | ICD-10-CM | POA: Diagnosis not present

## 2023-07-26 DIAGNOSIS — M542 Cervicalgia: Secondary | ICD-10-CM | POA: Diagnosis not present

## 2023-07-29 DIAGNOSIS — M5033 Other cervical disc degeneration, cervicothoracic region: Secondary | ICD-10-CM | POA: Diagnosis not present

## 2023-07-29 DIAGNOSIS — M62838 Other muscle spasm: Secondary | ICD-10-CM | POA: Diagnosis not present

## 2023-07-29 DIAGNOSIS — M542 Cervicalgia: Secondary | ICD-10-CM | POA: Diagnosis not present

## 2023-07-29 DIAGNOSIS — M47812 Spondylosis without myelopathy or radiculopathy, cervical region: Secondary | ICD-10-CM | POA: Diagnosis not present

## 2023-08-03 DIAGNOSIS — M62838 Other muscle spasm: Secondary | ICD-10-CM | POA: Diagnosis not present

## 2023-08-03 DIAGNOSIS — M542 Cervicalgia: Secondary | ICD-10-CM | POA: Diagnosis not present

## 2023-08-03 DIAGNOSIS — M5033 Other cervical disc degeneration, cervicothoracic region: Secondary | ICD-10-CM | POA: Diagnosis not present

## 2023-08-03 DIAGNOSIS — M47812 Spondylosis without myelopathy or radiculopathy, cervical region: Secondary | ICD-10-CM | POA: Diagnosis not present

## 2023-08-06 DIAGNOSIS — M62838 Other muscle spasm: Secondary | ICD-10-CM | POA: Diagnosis not present

## 2023-08-06 DIAGNOSIS — H8113 Benign paroxysmal vertigo, bilateral: Secondary | ICD-10-CM | POA: Diagnosis not present

## 2023-08-06 DIAGNOSIS — M542 Cervicalgia: Secondary | ICD-10-CM | POA: Diagnosis not present

## 2023-08-06 DIAGNOSIS — M5033 Other cervical disc degeneration, cervicothoracic region: Secondary | ICD-10-CM | POA: Diagnosis not present

## 2023-08-06 DIAGNOSIS — M47812 Spondylosis without myelopathy or radiculopathy, cervical region: Secondary | ICD-10-CM | POA: Diagnosis not present

## 2023-08-09 DIAGNOSIS — M542 Cervicalgia: Secondary | ICD-10-CM | POA: Diagnosis not present

## 2023-08-09 DIAGNOSIS — M62838 Other muscle spasm: Secondary | ICD-10-CM | POA: Diagnosis not present

## 2023-08-09 DIAGNOSIS — H8113 Benign paroxysmal vertigo, bilateral: Secondary | ICD-10-CM | POA: Diagnosis not present

## 2023-08-09 DIAGNOSIS — M47812 Spondylosis without myelopathy or radiculopathy, cervical region: Secondary | ICD-10-CM | POA: Diagnosis not present

## 2023-08-09 DIAGNOSIS — M5033 Other cervical disc degeneration, cervicothoracic region: Secondary | ICD-10-CM | POA: Diagnosis not present

## 2023-08-11 DIAGNOSIS — E1159 Type 2 diabetes mellitus with other circulatory complications: Secondary | ICD-10-CM | POA: Diagnosis not present

## 2023-08-11 DIAGNOSIS — E1165 Type 2 diabetes mellitus with hyperglycemia: Secondary | ICD-10-CM | POA: Diagnosis not present

## 2023-08-11 DIAGNOSIS — E785 Hyperlipidemia, unspecified: Secondary | ICD-10-CM | POA: Diagnosis not present

## 2023-08-11 DIAGNOSIS — I152 Hypertension secondary to endocrine disorders: Secondary | ICD-10-CM | POA: Diagnosis not present

## 2023-08-11 DIAGNOSIS — E1169 Type 2 diabetes mellitus with other specified complication: Secondary | ICD-10-CM | POA: Diagnosis not present

## 2023-08-13 DIAGNOSIS — M5033 Other cervical disc degeneration, cervicothoracic region: Secondary | ICD-10-CM | POA: Diagnosis not present

## 2023-08-13 DIAGNOSIS — H8113 Benign paroxysmal vertigo, bilateral: Secondary | ICD-10-CM | POA: Diagnosis not present

## 2023-08-13 DIAGNOSIS — M47812 Spondylosis without myelopathy or radiculopathy, cervical region: Secondary | ICD-10-CM | POA: Diagnosis not present

## 2023-08-13 DIAGNOSIS — M542 Cervicalgia: Secondary | ICD-10-CM | POA: Diagnosis not present

## 2023-08-13 DIAGNOSIS — M62838 Other muscle spasm: Secondary | ICD-10-CM | POA: Diagnosis not present

## 2023-09-01 ENCOUNTER — Other Ambulatory Visit: Payer: Medicare HMO

## 2023-09-15 DIAGNOSIS — M25511 Pain in right shoulder: Secondary | ICD-10-CM | POA: Diagnosis not present

## 2023-09-15 DIAGNOSIS — M13811 Other specified arthritis, right shoulder: Secondary | ICD-10-CM | POA: Diagnosis not present

## 2023-09-16 DIAGNOSIS — M62838 Other muscle spasm: Secondary | ICD-10-CM | POA: Diagnosis not present

## 2023-09-16 DIAGNOSIS — M5033 Other cervical disc degeneration, cervicothoracic region: Secondary | ICD-10-CM | POA: Diagnosis not present

## 2023-09-16 DIAGNOSIS — H8113 Benign paroxysmal vertigo, bilateral: Secondary | ICD-10-CM | POA: Diagnosis not present

## 2023-09-16 DIAGNOSIS — M47812 Spondylosis without myelopathy or radiculopathy, cervical region: Secondary | ICD-10-CM | POA: Diagnosis not present

## 2023-09-16 DIAGNOSIS — M542 Cervicalgia: Secondary | ICD-10-CM | POA: Diagnosis not present

## 2023-09-20 DIAGNOSIS — M5033 Other cervical disc degeneration, cervicothoracic region: Secondary | ICD-10-CM | POA: Diagnosis not present

## 2023-09-20 DIAGNOSIS — M47812 Spondylosis without myelopathy or radiculopathy, cervical region: Secondary | ICD-10-CM | POA: Diagnosis not present

## 2023-09-20 DIAGNOSIS — H8113 Benign paroxysmal vertigo, bilateral: Secondary | ICD-10-CM | POA: Diagnosis not present

## 2023-09-20 DIAGNOSIS — M62838 Other muscle spasm: Secondary | ICD-10-CM | POA: Diagnosis not present

## 2023-09-20 DIAGNOSIS — M542 Cervicalgia: Secondary | ICD-10-CM | POA: Diagnosis not present

## 2023-09-21 ENCOUNTER — Ambulatory Visit
Admission: RE | Admit: 2023-09-21 | Discharge: 2023-09-21 | Disposition: A | Discharge status: Home / Self Care | Payer: Medicare HMO | Source: Ambulatory Visit | Attending: Oncology | Admitting: Oncology

## 2023-09-21 DIAGNOSIS — Z853 Personal history of malignant neoplasm of breast: Secondary | ICD-10-CM | POA: Insufficient documentation

## 2023-09-21 DIAGNOSIS — Z13820 Encounter for screening for osteoporosis: Secondary | ICD-10-CM | POA: Diagnosis not present

## 2023-09-21 DIAGNOSIS — Z79899 Other long term (current) drug therapy: Secondary | ICD-10-CM | POA: Diagnosis not present

## 2023-09-21 DIAGNOSIS — M85852 Other specified disorders of bone density and structure, left thigh: Secondary | ICD-10-CM | POA: Insufficient documentation

## 2023-09-21 DIAGNOSIS — Z780 Asymptomatic menopausal state: Secondary | ICD-10-CM | POA: Diagnosis not present

## 2023-09-21 DIAGNOSIS — Z923 Personal history of irradiation: Secondary | ICD-10-CM | POA: Insufficient documentation

## 2023-09-21 DIAGNOSIS — Z08 Encounter for follow-up examination after completed treatment for malignant neoplasm: Secondary | ICD-10-CM | POA: Diagnosis not present

## 2023-10-11 DIAGNOSIS — E119 Type 2 diabetes mellitus without complications: Secondary | ICD-10-CM | POA: Diagnosis not present

## 2023-10-11 DIAGNOSIS — Z0000 Encounter for general adult medical examination without abnormal findings: Secondary | ICD-10-CM | POA: Diagnosis not present

## 2023-10-11 DIAGNOSIS — G4733 Obstructive sleep apnea (adult) (pediatric): Secondary | ICD-10-CM | POA: Diagnosis not present

## 2023-10-11 DIAGNOSIS — E785 Hyperlipidemia, unspecified: Secondary | ICD-10-CM | POA: Diagnosis not present

## 2023-10-11 DIAGNOSIS — I10 Essential (primary) hypertension: Secondary | ICD-10-CM | POA: Diagnosis not present

## 2023-10-11 DIAGNOSIS — Z1331 Encounter for screening for depression: Secondary | ICD-10-CM | POA: Diagnosis not present

## 2023-10-15 DIAGNOSIS — Z0189 Encounter for other specified special examinations: Secondary | ICD-10-CM | POA: Diagnosis not present

## 2023-10-15 DIAGNOSIS — M19011 Primary osteoarthritis, right shoulder: Secondary | ICD-10-CM | POA: Diagnosis not present

## 2023-10-28 DIAGNOSIS — E119 Type 2 diabetes mellitus without complications: Secondary | ICD-10-CM | POA: Diagnosis not present

## 2023-10-28 DIAGNOSIS — Z0100 Encounter for examination of eyes and vision without abnormal findings: Secondary | ICD-10-CM | POA: Diagnosis not present

## 2023-11-01 ENCOUNTER — Other Ambulatory Visit: Payer: Self-pay | Admitting: Family Medicine

## 2023-11-01 DIAGNOSIS — Z1231 Encounter for screening mammogram for malignant neoplasm of breast: Secondary | ICD-10-CM

## 2023-11-05 ENCOUNTER — Other Ambulatory Visit
Admission: RE | Admit: 2023-11-05 | Discharge: 2023-11-05 | Disposition: A | Discharge status: Home / Self Care | Payer: Medicare HMO | Source: Ambulatory Visit | Attending: Nurse Practitioner | Admitting: Nurse Practitioner

## 2023-11-05 DIAGNOSIS — R252 Cramp and spasm: Secondary | ICD-10-CM | POA: Diagnosis not present

## 2023-11-05 DIAGNOSIS — E785 Hyperlipidemia, unspecified: Secondary | ICD-10-CM | POA: Diagnosis not present

## 2023-11-05 DIAGNOSIS — G4733 Obstructive sleep apnea (adult) (pediatric): Secondary | ICD-10-CM | POA: Diagnosis not present

## 2023-11-05 DIAGNOSIS — I10 Essential (primary) hypertension: Secondary | ICD-10-CM | POA: Diagnosis not present

## 2023-11-05 DIAGNOSIS — I350 Nonrheumatic aortic (valve) stenosis: Secondary | ICD-10-CM | POA: Diagnosis not present

## 2023-11-05 DIAGNOSIS — R600 Localized edema: Secondary | ICD-10-CM | POA: Insufficient documentation

## 2023-11-05 DIAGNOSIS — G629 Polyneuropathy, unspecified: Secondary | ICD-10-CM | POA: Diagnosis not present

## 2023-11-05 DIAGNOSIS — E119 Type 2 diabetes mellitus without complications: Secondary | ICD-10-CM | POA: Diagnosis not present

## 2023-11-05 DIAGNOSIS — R079 Chest pain, unspecified: Secondary | ICD-10-CM | POA: Diagnosis not present

## 2023-11-05 LAB — BRAIN NATRIURETIC PEPTIDE: B Natriuretic Peptide: 96.5 pg/mL (ref 0.0–100.0)

## 2023-11-15 DIAGNOSIS — R600 Localized edema: Secondary | ICD-10-CM | POA: Diagnosis not present

## 2023-11-15 DIAGNOSIS — E1165 Type 2 diabetes mellitus with hyperglycemia: Secondary | ICD-10-CM | POA: Diagnosis not present

## 2023-11-16 ENCOUNTER — Other Ambulatory Visit: Payer: Self-pay | Admitting: Nurse Practitioner

## 2023-11-16 DIAGNOSIS — I350 Nonrheumatic aortic (valve) stenosis: Secondary | ICD-10-CM

## 2023-11-16 DIAGNOSIS — E785 Hyperlipidemia, unspecified: Secondary | ICD-10-CM

## 2023-11-16 DIAGNOSIS — R079 Chest pain, unspecified: Secondary | ICD-10-CM

## 2023-11-16 DIAGNOSIS — Z01810 Encounter for preprocedural cardiovascular examination: Secondary | ICD-10-CM

## 2023-11-16 DIAGNOSIS — E119 Type 2 diabetes mellitus without complications: Secondary | ICD-10-CM

## 2023-11-16 DIAGNOSIS — I10 Essential (primary) hypertension: Secondary | ICD-10-CM

## 2023-11-16 DIAGNOSIS — R600 Localized edema: Secondary | ICD-10-CM

## 2023-11-16 DIAGNOSIS — R0789 Other chest pain: Secondary | ICD-10-CM | POA: Diagnosis not present

## 2023-11-22 ENCOUNTER — Encounter
Admission: RE | Admit: 2023-11-22 | Discharge: 2023-11-22 | Disposition: A | Discharge status: Home / Self Care | Payer: Medicare HMO | Source: Ambulatory Visit | Attending: Nurse Practitioner | Admitting: Nurse Practitioner

## 2023-11-22 DIAGNOSIS — E119 Type 2 diabetes mellitus without complications: Secondary | ICD-10-CM | POA: Insufficient documentation

## 2023-11-22 DIAGNOSIS — R079 Chest pain, unspecified: Secondary | ICD-10-CM | POA: Diagnosis not present

## 2023-11-22 DIAGNOSIS — I350 Nonrheumatic aortic (valve) stenosis: Secondary | ICD-10-CM | POA: Diagnosis not present

## 2023-11-22 DIAGNOSIS — E785 Hyperlipidemia, unspecified: Secondary | ICD-10-CM | POA: Insufficient documentation

## 2023-11-22 DIAGNOSIS — R600 Localized edema: Secondary | ICD-10-CM | POA: Insufficient documentation

## 2023-11-22 DIAGNOSIS — Z01810 Encounter for preprocedural cardiovascular examination: Secondary | ICD-10-CM | POA: Insufficient documentation

## 2023-11-22 DIAGNOSIS — I10 Essential (primary) hypertension: Secondary | ICD-10-CM | POA: Diagnosis not present

## 2023-11-22 DIAGNOSIS — R0789 Other chest pain: Secondary | ICD-10-CM | POA: Diagnosis not present

## 2023-11-22 LAB — NM MYOCAR MULTI W/SPECT W/WALL MOTION / EF
Base ST Depression (mm): 0 mm
Estimated workload: 1
Exercise duration (min): 1 min
LV dias vol: 94 mL (ref 46–106)
LV sys vol: 24 mL
MPHR: 141 {beats}/min
Nuc Stress EF: 74 %
Peak HR: 94 {beats}/min
Percent HR: 66 %
Rest HR: 69 {beats}/min
Rest Nuclear Isotope Dose: 10.7 mCi
SDS: 1
SRS: 13
SSS: 8
ST Depression (mm): 0 mm
Stress Nuclear Isotope Dose: 31.2 mCi
TID: 1.02

## 2023-11-22 MED ORDER — TECHNETIUM TC 99M TETROFOSMIN IV KIT
30.0000 | PACK | Freq: Once | INTRAVENOUS | Status: AC
  Administered 2023-11-22: 30.15 via INTRAVENOUS

## 2023-11-22 MED ORDER — TECHNETIUM TC 99M TETROFOSMIN IV KIT
10.0000 | PACK | Freq: Once | INTRAVENOUS | Status: AC | PRN
  Administered 2023-11-22: 10.69 via INTRAVENOUS

## 2023-11-22 MED ORDER — REGADENOSON 0.4 MG/5ML IV SOLN
0.4000 mg | Freq: Once | INTRAVENOUS | Status: AC
  Administered 2023-11-22: 0.4 mg via INTRAVENOUS

## 2023-12-02 ENCOUNTER — Ambulatory Visit
Admission: RE | Admit: 2023-12-02 | Discharge: 2023-12-02 | Disposition: A | Discharge status: Home / Self Care | Payer: Medicare HMO | Source: Ambulatory Visit | Attending: Family Medicine | Admitting: Family Medicine

## 2023-12-02 DIAGNOSIS — Z1231 Encounter for screening mammogram for malignant neoplasm of breast: Secondary | ICD-10-CM | POA: Diagnosis not present

## 2023-12-03 DIAGNOSIS — I10 Essential (primary) hypertension: Secondary | ICD-10-CM | POA: Diagnosis not present

## 2023-12-03 DIAGNOSIS — E785 Hyperlipidemia, unspecified: Secondary | ICD-10-CM | POA: Diagnosis not present

## 2023-12-03 DIAGNOSIS — I272 Pulmonary hypertension, unspecified: Secondary | ICD-10-CM | POA: Diagnosis not present

## 2023-12-03 DIAGNOSIS — G4733 Obstructive sleep apnea (adult) (pediatric): Secondary | ICD-10-CM | POA: Diagnosis not present

## 2023-12-03 DIAGNOSIS — R0609 Other forms of dyspnea: Secondary | ICD-10-CM | POA: Diagnosis not present

## 2023-12-03 DIAGNOSIS — E876 Hypokalemia: Secondary | ICD-10-CM | POA: Diagnosis not present

## 2023-12-03 DIAGNOSIS — I350 Nonrheumatic aortic (valve) stenosis: Secondary | ICD-10-CM | POA: Diagnosis not present

## 2023-12-03 DIAGNOSIS — R600 Localized edema: Secondary | ICD-10-CM | POA: Diagnosis not present

## 2023-12-15 HISTORY — PX: ROTATOR CUFF REPAIR: SHX139

## 2023-12-24 ENCOUNTER — Ambulatory Visit: Admission: RE | Admit: 2023-12-24 | Payer: Medicare HMO | Source: Ambulatory Visit

## 2023-12-24 ENCOUNTER — Other Ambulatory Visit: Payer: Self-pay | Admitting: Orthopedic Surgery

## 2023-12-24 DIAGNOSIS — M19011 Primary osteoarthritis, right shoulder: Secondary | ICD-10-CM

## 2023-12-27 ENCOUNTER — Other Ambulatory Visit: Payer: Self-pay | Admitting: Orthopedic Surgery

## 2023-12-27 DIAGNOSIS — M19011 Primary osteoarthritis, right shoulder: Secondary | ICD-10-CM

## 2023-12-30 DIAGNOSIS — Z01818 Encounter for other preprocedural examination: Secondary | ICD-10-CM | POA: Diagnosis not present

## 2023-12-30 DIAGNOSIS — Z0189 Encounter for other specified special examinations: Secondary | ICD-10-CM | POA: Diagnosis not present

## 2023-12-31 ENCOUNTER — Inpatient Hospital Stay: Payer: Medicare HMO | Attending: Oncology | Admitting: Oncology

## 2024-01-04 ENCOUNTER — Ambulatory Visit
Admission: RE | Admit: 2024-01-04 | Discharge: 2024-01-04 | Disposition: A | Discharge status: Home / Self Care | Payer: Medicare HMO | Source: Ambulatory Visit | Attending: Orthopedic Surgery | Admitting: Orthopedic Surgery

## 2024-01-04 DIAGNOSIS — Z01818 Encounter for other preprocedural examination: Secondary | ICD-10-CM | POA: Diagnosis not present

## 2024-01-04 DIAGNOSIS — M25511 Pain in right shoulder: Secondary | ICD-10-CM | POA: Diagnosis not present

## 2024-01-04 DIAGNOSIS — M47812 Spondylosis without myelopathy or radiculopathy, cervical region: Secondary | ICD-10-CM | POA: Diagnosis not present

## 2024-01-04 DIAGNOSIS — M19011 Primary osteoarthritis, right shoulder: Secondary | ICD-10-CM | POA: Insufficient documentation

## 2024-01-06 DIAGNOSIS — M25711 Osteophyte, right shoulder: Secondary | ICD-10-CM | POA: Diagnosis not present

## 2024-01-06 DIAGNOSIS — Z7982 Long term (current) use of aspirin: Secondary | ICD-10-CM | POA: Diagnosis not present

## 2024-01-06 DIAGNOSIS — G8918 Other acute postprocedural pain: Secondary | ICD-10-CM | POA: Diagnosis not present

## 2024-01-06 DIAGNOSIS — M75101 Unspecified rotator cuff tear or rupture of right shoulder, not specified as traumatic: Secondary | ICD-10-CM | POA: Diagnosis not present

## 2024-01-06 DIAGNOSIS — Z87891 Personal history of nicotine dependence: Secondary | ICD-10-CM | POA: Diagnosis not present

## 2024-01-06 DIAGNOSIS — M75121 Complete rotator cuff tear or rupture of right shoulder, not specified as traumatic: Secondary | ICD-10-CM | POA: Diagnosis not present

## 2024-01-06 DIAGNOSIS — Z853 Personal history of malignant neoplasm of breast: Secondary | ICD-10-CM | POA: Diagnosis not present

## 2024-01-06 DIAGNOSIS — M19011 Primary osteoarthritis, right shoulder: Secondary | ICD-10-CM | POA: Diagnosis not present

## 2024-01-06 DIAGNOSIS — Z7984 Long term (current) use of oral hypoglycemic drugs: Secondary | ICD-10-CM | POA: Diagnosis not present

## 2024-01-07 DIAGNOSIS — Z7984 Long term (current) use of oral hypoglycemic drugs: Secondary | ICD-10-CM | POA: Diagnosis not present

## 2024-01-07 DIAGNOSIS — Z87891 Personal history of nicotine dependence: Secondary | ICD-10-CM | POA: Diagnosis not present

## 2024-01-07 DIAGNOSIS — Z853 Personal history of malignant neoplasm of breast: Secondary | ICD-10-CM | POA: Diagnosis not present

## 2024-01-07 DIAGNOSIS — M19011 Primary osteoarthritis, right shoulder: Secondary | ICD-10-CM | POA: Diagnosis not present

## 2024-01-07 DIAGNOSIS — M25711 Osteophyte, right shoulder: Secondary | ICD-10-CM | POA: Diagnosis not present

## 2024-01-07 DIAGNOSIS — M75101 Unspecified rotator cuff tear or rupture of right shoulder, not specified as traumatic: Secondary | ICD-10-CM | POA: Diagnosis not present

## 2024-01-07 DIAGNOSIS — Z7982 Long term (current) use of aspirin: Secondary | ICD-10-CM | POA: Diagnosis not present

## 2024-01-10 DIAGNOSIS — E119 Type 2 diabetes mellitus without complications: Secondary | ICD-10-CM | POA: Diagnosis not present

## 2024-01-10 DIAGNOSIS — Z7982 Long term (current) use of aspirin: Secondary | ICD-10-CM | POA: Diagnosis not present

## 2024-01-10 DIAGNOSIS — Z853 Personal history of malignant neoplasm of breast: Secondary | ICD-10-CM | POA: Diagnosis not present

## 2024-01-10 DIAGNOSIS — Z7984 Long term (current) use of oral hypoglycemic drugs: Secondary | ICD-10-CM | POA: Diagnosis not present

## 2024-01-10 DIAGNOSIS — I10 Essential (primary) hypertension: Secondary | ICD-10-CM | POA: Diagnosis not present

## 2024-01-10 DIAGNOSIS — Z96611 Presence of right artificial shoulder joint: Secondary | ICD-10-CM | POA: Diagnosis not present

## 2024-01-10 DIAGNOSIS — G4733 Obstructive sleep apnea (adult) (pediatric): Secondary | ICD-10-CM | POA: Diagnosis not present

## 2024-01-10 DIAGNOSIS — E7800 Pure hypercholesterolemia, unspecified: Secondary | ICD-10-CM | POA: Diagnosis not present

## 2024-01-10 DIAGNOSIS — Z471 Aftercare following joint replacement surgery: Secondary | ICD-10-CM | POA: Diagnosis not present

## 2024-01-13 DIAGNOSIS — G4733 Obstructive sleep apnea (adult) (pediatric): Secondary | ICD-10-CM | POA: Diagnosis not present

## 2024-01-13 DIAGNOSIS — Z96611 Presence of right artificial shoulder joint: Secondary | ICD-10-CM | POA: Diagnosis not present

## 2024-01-13 DIAGNOSIS — E119 Type 2 diabetes mellitus without complications: Secondary | ICD-10-CM | POA: Diagnosis not present

## 2024-01-13 DIAGNOSIS — I10 Essential (primary) hypertension: Secondary | ICD-10-CM | POA: Diagnosis not present

## 2024-01-13 DIAGNOSIS — E7800 Pure hypercholesterolemia, unspecified: Secondary | ICD-10-CM | POA: Diagnosis not present

## 2024-01-13 DIAGNOSIS — Z471 Aftercare following joint replacement surgery: Secondary | ICD-10-CM | POA: Diagnosis not present

## 2024-01-13 DIAGNOSIS — Z853 Personal history of malignant neoplasm of breast: Secondary | ICD-10-CM | POA: Diagnosis not present

## 2024-01-13 DIAGNOSIS — Z7984 Long term (current) use of oral hypoglycemic drugs: Secondary | ICD-10-CM | POA: Diagnosis not present

## 2024-01-13 DIAGNOSIS — Z7982 Long term (current) use of aspirin: Secondary | ICD-10-CM | POA: Diagnosis not present

## 2024-01-15 DIAGNOSIS — Z853 Personal history of malignant neoplasm of breast: Secondary | ICD-10-CM | POA: Diagnosis not present

## 2024-01-15 DIAGNOSIS — G4733 Obstructive sleep apnea (adult) (pediatric): Secondary | ICD-10-CM | POA: Diagnosis not present

## 2024-01-15 DIAGNOSIS — Z96611 Presence of right artificial shoulder joint: Secondary | ICD-10-CM | POA: Diagnosis not present

## 2024-01-15 DIAGNOSIS — Z471 Aftercare following joint replacement surgery: Secondary | ICD-10-CM | POA: Diagnosis not present

## 2024-01-15 DIAGNOSIS — I10 Essential (primary) hypertension: Secondary | ICD-10-CM | POA: Diagnosis not present

## 2024-01-15 DIAGNOSIS — Z7982 Long term (current) use of aspirin: Secondary | ICD-10-CM | POA: Diagnosis not present

## 2024-01-15 DIAGNOSIS — E7800 Pure hypercholesterolemia, unspecified: Secondary | ICD-10-CM | POA: Diagnosis not present

## 2024-01-15 DIAGNOSIS — Z7984 Long term (current) use of oral hypoglycemic drugs: Secondary | ICD-10-CM | POA: Diagnosis not present

## 2024-01-15 DIAGNOSIS — E119 Type 2 diabetes mellitus without complications: Secondary | ICD-10-CM | POA: Diagnosis not present

## 2024-01-19 DIAGNOSIS — Z96611 Presence of right artificial shoulder joint: Secondary | ICD-10-CM | POA: Diagnosis not present

## 2024-01-19 DIAGNOSIS — I10 Essential (primary) hypertension: Secondary | ICD-10-CM | POA: Diagnosis not present

## 2024-01-19 DIAGNOSIS — E119 Type 2 diabetes mellitus without complications: Secondary | ICD-10-CM | POA: Diagnosis not present

## 2024-01-19 DIAGNOSIS — G4733 Obstructive sleep apnea (adult) (pediatric): Secondary | ICD-10-CM | POA: Diagnosis not present

## 2024-01-19 DIAGNOSIS — E7800 Pure hypercholesterolemia, unspecified: Secondary | ICD-10-CM | POA: Diagnosis not present

## 2024-01-19 DIAGNOSIS — Z7982 Long term (current) use of aspirin: Secondary | ICD-10-CM | POA: Diagnosis not present

## 2024-01-19 DIAGNOSIS — Z471 Aftercare following joint replacement surgery: Secondary | ICD-10-CM | POA: Diagnosis not present

## 2024-01-19 DIAGNOSIS — Z7984 Long term (current) use of oral hypoglycemic drugs: Secondary | ICD-10-CM | POA: Diagnosis not present

## 2024-01-19 DIAGNOSIS — Z853 Personal history of malignant neoplasm of breast: Secondary | ICD-10-CM | POA: Diagnosis not present

## 2024-01-20 DIAGNOSIS — Z853 Personal history of malignant neoplasm of breast: Secondary | ICD-10-CM | POA: Diagnosis not present

## 2024-01-20 DIAGNOSIS — E119 Type 2 diabetes mellitus without complications: Secondary | ICD-10-CM | POA: Diagnosis not present

## 2024-01-20 DIAGNOSIS — Z96611 Presence of right artificial shoulder joint: Secondary | ICD-10-CM | POA: Diagnosis not present

## 2024-01-20 DIAGNOSIS — Z7984 Long term (current) use of oral hypoglycemic drugs: Secondary | ICD-10-CM | POA: Diagnosis not present

## 2024-01-20 DIAGNOSIS — E7800 Pure hypercholesterolemia, unspecified: Secondary | ICD-10-CM | POA: Diagnosis not present

## 2024-01-20 DIAGNOSIS — Z471 Aftercare following joint replacement surgery: Secondary | ICD-10-CM | POA: Diagnosis not present

## 2024-01-20 DIAGNOSIS — Z7982 Long term (current) use of aspirin: Secondary | ICD-10-CM | POA: Diagnosis not present

## 2024-01-20 DIAGNOSIS — I10 Essential (primary) hypertension: Secondary | ICD-10-CM | POA: Diagnosis not present

## 2024-01-20 DIAGNOSIS — G4733 Obstructive sleep apnea (adult) (pediatric): Secondary | ICD-10-CM | POA: Diagnosis not present

## 2024-01-24 DIAGNOSIS — Z471 Aftercare following joint replacement surgery: Secondary | ICD-10-CM | POA: Diagnosis not present

## 2024-01-24 DIAGNOSIS — E7800 Pure hypercholesterolemia, unspecified: Secondary | ICD-10-CM | POA: Diagnosis not present

## 2024-01-24 DIAGNOSIS — Z853 Personal history of malignant neoplasm of breast: Secondary | ICD-10-CM | POA: Diagnosis not present

## 2024-01-24 DIAGNOSIS — Z7984 Long term (current) use of oral hypoglycemic drugs: Secondary | ICD-10-CM | POA: Diagnosis not present

## 2024-01-24 DIAGNOSIS — E119 Type 2 diabetes mellitus without complications: Secondary | ICD-10-CM | POA: Diagnosis not present

## 2024-01-24 DIAGNOSIS — Z96611 Presence of right artificial shoulder joint: Secondary | ICD-10-CM | POA: Diagnosis not present

## 2024-01-24 DIAGNOSIS — Z7982 Long term (current) use of aspirin: Secondary | ICD-10-CM | POA: Diagnosis not present

## 2024-01-24 DIAGNOSIS — I10 Essential (primary) hypertension: Secondary | ICD-10-CM | POA: Diagnosis not present

## 2024-01-24 DIAGNOSIS — G4733 Obstructive sleep apnea (adult) (pediatric): Secondary | ICD-10-CM | POA: Diagnosis not present

## 2024-01-27 DIAGNOSIS — Z96611 Presence of right artificial shoulder joint: Secondary | ICD-10-CM | POA: Diagnosis not present

## 2024-01-27 DIAGNOSIS — Z7984 Long term (current) use of oral hypoglycemic drugs: Secondary | ICD-10-CM | POA: Diagnosis not present

## 2024-01-27 DIAGNOSIS — Z7982 Long term (current) use of aspirin: Secondary | ICD-10-CM | POA: Diagnosis not present

## 2024-01-27 DIAGNOSIS — Z853 Personal history of malignant neoplasm of breast: Secondary | ICD-10-CM | POA: Diagnosis not present

## 2024-01-27 DIAGNOSIS — E7800 Pure hypercholesterolemia, unspecified: Secondary | ICD-10-CM | POA: Diagnosis not present

## 2024-01-27 DIAGNOSIS — I10 Essential (primary) hypertension: Secondary | ICD-10-CM | POA: Diagnosis not present

## 2024-01-27 DIAGNOSIS — Z471 Aftercare following joint replacement surgery: Secondary | ICD-10-CM | POA: Diagnosis not present

## 2024-01-27 DIAGNOSIS — E119 Type 2 diabetes mellitus without complications: Secondary | ICD-10-CM | POA: Diagnosis not present

## 2024-01-27 DIAGNOSIS — G4733 Obstructive sleep apnea (adult) (pediatric): Secondary | ICD-10-CM | POA: Diagnosis not present

## 2024-01-28 DIAGNOSIS — Z96611 Presence of right artificial shoulder joint: Secondary | ICD-10-CM | POA: Diagnosis not present

## 2024-01-28 DIAGNOSIS — I10 Essential (primary) hypertension: Secondary | ICD-10-CM | POA: Diagnosis not present

## 2024-01-28 DIAGNOSIS — G4733 Obstructive sleep apnea (adult) (pediatric): Secondary | ICD-10-CM | POA: Diagnosis not present

## 2024-01-28 DIAGNOSIS — E119 Type 2 diabetes mellitus without complications: Secondary | ICD-10-CM | POA: Diagnosis not present

## 2024-01-28 DIAGNOSIS — Z7984 Long term (current) use of oral hypoglycemic drugs: Secondary | ICD-10-CM | POA: Diagnosis not present

## 2024-01-28 DIAGNOSIS — E7800 Pure hypercholesterolemia, unspecified: Secondary | ICD-10-CM | POA: Diagnosis not present

## 2024-01-28 DIAGNOSIS — Z471 Aftercare following joint replacement surgery: Secondary | ICD-10-CM | POA: Diagnosis not present

## 2024-01-28 DIAGNOSIS — Z7982 Long term (current) use of aspirin: Secondary | ICD-10-CM | POA: Diagnosis not present

## 2024-01-28 DIAGNOSIS — Z853 Personal history of malignant neoplasm of breast: Secondary | ICD-10-CM | POA: Diagnosis not present

## 2024-01-31 DIAGNOSIS — Z7982 Long term (current) use of aspirin: Secondary | ICD-10-CM | POA: Diagnosis not present

## 2024-01-31 DIAGNOSIS — Z471 Aftercare following joint replacement surgery: Secondary | ICD-10-CM | POA: Diagnosis not present

## 2024-01-31 DIAGNOSIS — Z7984 Long term (current) use of oral hypoglycemic drugs: Secondary | ICD-10-CM | POA: Diagnosis not present

## 2024-01-31 DIAGNOSIS — E119 Type 2 diabetes mellitus without complications: Secondary | ICD-10-CM | POA: Diagnosis not present

## 2024-01-31 DIAGNOSIS — Z96611 Presence of right artificial shoulder joint: Secondary | ICD-10-CM | POA: Diagnosis not present

## 2024-01-31 DIAGNOSIS — I10 Essential (primary) hypertension: Secondary | ICD-10-CM | POA: Diagnosis not present

## 2024-01-31 DIAGNOSIS — G4733 Obstructive sleep apnea (adult) (pediatric): Secondary | ICD-10-CM | POA: Diagnosis not present

## 2024-01-31 DIAGNOSIS — E7800 Pure hypercholesterolemia, unspecified: Secondary | ICD-10-CM | POA: Diagnosis not present

## 2024-01-31 DIAGNOSIS — Z853 Personal history of malignant neoplasm of breast: Secondary | ICD-10-CM | POA: Diagnosis not present

## 2024-02-03 DIAGNOSIS — I10 Essential (primary) hypertension: Secondary | ICD-10-CM | POA: Diagnosis not present

## 2024-02-03 DIAGNOSIS — G4733 Obstructive sleep apnea (adult) (pediatric): Secondary | ICD-10-CM | POA: Diagnosis not present

## 2024-02-03 DIAGNOSIS — Z471 Aftercare following joint replacement surgery: Secondary | ICD-10-CM | POA: Diagnosis not present

## 2024-02-03 DIAGNOSIS — E119 Type 2 diabetes mellitus without complications: Secondary | ICD-10-CM | POA: Diagnosis not present

## 2024-02-03 DIAGNOSIS — Z853 Personal history of malignant neoplasm of breast: Secondary | ICD-10-CM | POA: Diagnosis not present

## 2024-02-03 DIAGNOSIS — Z7982 Long term (current) use of aspirin: Secondary | ICD-10-CM | POA: Diagnosis not present

## 2024-02-03 DIAGNOSIS — Z7984 Long term (current) use of oral hypoglycemic drugs: Secondary | ICD-10-CM | POA: Diagnosis not present

## 2024-02-03 DIAGNOSIS — E7800 Pure hypercholesterolemia, unspecified: Secondary | ICD-10-CM | POA: Diagnosis not present

## 2024-02-03 DIAGNOSIS — Z96611 Presence of right artificial shoulder joint: Secondary | ICD-10-CM | POA: Diagnosis not present

## 2024-02-04 DIAGNOSIS — Z471 Aftercare following joint replacement surgery: Secondary | ICD-10-CM | POA: Diagnosis not present

## 2024-02-04 DIAGNOSIS — E119 Type 2 diabetes mellitus without complications: Secondary | ICD-10-CM | POA: Diagnosis not present

## 2024-02-04 DIAGNOSIS — G4733 Obstructive sleep apnea (adult) (pediatric): Secondary | ICD-10-CM | POA: Diagnosis not present

## 2024-02-04 DIAGNOSIS — I10 Essential (primary) hypertension: Secondary | ICD-10-CM | POA: Diagnosis not present

## 2024-02-04 DIAGNOSIS — Z96611 Presence of right artificial shoulder joint: Secondary | ICD-10-CM | POA: Diagnosis not present

## 2024-02-04 DIAGNOSIS — Z7982 Long term (current) use of aspirin: Secondary | ICD-10-CM | POA: Diagnosis not present

## 2024-02-04 DIAGNOSIS — E7800 Pure hypercholesterolemia, unspecified: Secondary | ICD-10-CM | POA: Diagnosis not present

## 2024-02-04 DIAGNOSIS — Z853 Personal history of malignant neoplasm of breast: Secondary | ICD-10-CM | POA: Diagnosis not present

## 2024-02-04 DIAGNOSIS — Z7984 Long term (current) use of oral hypoglycemic drugs: Secondary | ICD-10-CM | POA: Diagnosis not present

## 2024-02-10 DIAGNOSIS — E1169 Type 2 diabetes mellitus with other specified complication: Secondary | ICD-10-CM | POA: Diagnosis not present

## 2024-02-10 DIAGNOSIS — E1159 Type 2 diabetes mellitus with other circulatory complications: Secondary | ICD-10-CM | POA: Diagnosis not present

## 2024-02-10 DIAGNOSIS — E785 Hyperlipidemia, unspecified: Secondary | ICD-10-CM | POA: Diagnosis not present

## 2024-02-10 DIAGNOSIS — I152 Hypertension secondary to endocrine disorders: Secondary | ICD-10-CM | POA: Diagnosis not present

## 2024-02-10 DIAGNOSIS — E1165 Type 2 diabetes mellitus with hyperglycemia: Secondary | ICD-10-CM | POA: Diagnosis not present

## 2024-02-16 DIAGNOSIS — I272 Pulmonary hypertension, unspecified: Secondary | ICD-10-CM | POA: Diagnosis not present

## 2024-02-16 DIAGNOSIS — Z4889 Encounter for other specified surgical aftercare: Secondary | ICD-10-CM | POA: Diagnosis not present

## 2024-02-16 DIAGNOSIS — R600 Localized edema: Secondary | ICD-10-CM | POA: Diagnosis not present

## 2024-02-16 DIAGNOSIS — N1831 Chronic kidney disease, stage 3a: Secondary | ICD-10-CM | POA: Diagnosis not present

## 2024-02-16 DIAGNOSIS — Z96611 Presence of right artificial shoulder joint: Secondary | ICD-10-CM | POA: Diagnosis not present

## 2024-02-18 DIAGNOSIS — Z96611 Presence of right artificial shoulder joint: Secondary | ICD-10-CM | POA: Diagnosis not present

## 2024-02-21 DIAGNOSIS — Z96611 Presence of right artificial shoulder joint: Secondary | ICD-10-CM | POA: Diagnosis not present

## 2024-02-28 DIAGNOSIS — M25611 Stiffness of right shoulder, not elsewhere classified: Secondary | ICD-10-CM | POA: Diagnosis not present

## 2024-02-28 DIAGNOSIS — M25511 Pain in right shoulder: Secondary | ICD-10-CM | POA: Diagnosis not present

## 2024-03-01 DIAGNOSIS — M25511 Pain in right shoulder: Secondary | ICD-10-CM | POA: Diagnosis not present

## 2024-03-01 DIAGNOSIS — M25611 Stiffness of right shoulder, not elsewhere classified: Secondary | ICD-10-CM | POA: Diagnosis not present

## 2024-03-06 DIAGNOSIS — M25511 Pain in right shoulder: Secondary | ICD-10-CM | POA: Diagnosis not present

## 2024-03-06 DIAGNOSIS — M25611 Stiffness of right shoulder, not elsewhere classified: Secondary | ICD-10-CM | POA: Diagnosis not present

## 2024-03-08 DIAGNOSIS — M25511 Pain in right shoulder: Secondary | ICD-10-CM | POA: Diagnosis not present

## 2024-03-08 DIAGNOSIS — M25611 Stiffness of right shoulder, not elsewhere classified: Secondary | ICD-10-CM | POA: Diagnosis not present

## 2024-03-13 DIAGNOSIS — M25511 Pain in right shoulder: Secondary | ICD-10-CM | POA: Diagnosis not present

## 2024-03-13 DIAGNOSIS — M25611 Stiffness of right shoulder, not elsewhere classified: Secondary | ICD-10-CM | POA: Diagnosis not present

## 2024-03-15 DIAGNOSIS — M7989 Other specified soft tissue disorders: Secondary | ICD-10-CM | POA: Insufficient documentation

## 2024-03-15 DIAGNOSIS — M79669 Pain in unspecified lower leg: Secondary | ICD-10-CM | POA: Insufficient documentation

## 2024-03-15 DIAGNOSIS — I890 Lymphedema, not elsewhere classified: Secondary | ICD-10-CM | POA: Insufficient documentation

## 2024-03-15 NOTE — Progress Notes (Signed)
 MRN : [id number]  Jennifer Maddox is a 78 y.o. ([birth year]) female who presents with chief complaint of legs swell.  History of Present Illness:   The patient returns to the office for followup evaluation regarding leg swelling.  The swelling has persisted and the pain associated with swelling continues. There have not been any interval development of a ulcerations or wounds.  Since the previous visit the patient has been wearing graduated compression stockings and has noted little if any improvement in the lymphedema. The patient has been using compression routinely morning until night.  The patient also states elevation during the day and exercise is being done too.  No outpatient medications have been marked as taking for the 03/16/24 encounter (Appointment) with Gilda Crease, Latina Craver, MD.    Past Medical History:  Diagnosis Date   Anemia    Arthritis    Breast cancer (HCC) 2011/2019   left breast   Breast cancer, left breast (HCC) 2011   COPD (chronic obstructive pulmonary disease) (HCC)    Degenerative disc disease, lumbar    Diabetes mellitus without complication (HCC)    Diverticulosis    Hyperlipidemia    Hypertension    Personal history of radiation therapy    Sleep apnea    TIA (transient ischemic attack)     Past Surgical History:  Procedure Laterality Date   ABDOMINAL HYSTERECTOMY     around age 61 ; ovaries intact   BREAST BIOPSY Left 12/01/2018   Pathology LEFT breast mass 11 o'clock location: Invasive mammary   BREAST EXCISIONAL BIOPSY Left 2011   breast ca with radation   CHOLECYSTECTOMY     COLONOSCOPY W/ POLYPECTOMY     COLONOSCOPY WITH PROPOFOL N/A 10/29/2017   Procedure: COLONOSCOPY WITH PROPOFOL;  Surgeon: Scot Jun, MD;  Location: [facility] ENDOSCOPY;  Service: Endoscopy;  Laterality: N/A;   COLONOSCOPY WITH PROPOFOL N/A 07/24/2022   Procedure: COLONOSCOPY WITH PROPOFOL;  Surgeon: Regis Bill, MD;  Location: [facility]  ENDOSCOPY;  Service: Endoscopy;  Laterality: N/A;   ESOPHAGOGASTRODUODENOSCOPY (EGD) WITH PROPOFOL N/A 10/29/2017   Procedure: ESOPHAGOGASTRODUODENOSCOPY (EGD) WITH PROPOFOL;  Surgeon: Scot Jun, MD;  Location: [facility] ENDOSCOPY;  Service: Endoscopy;  Laterality: N/A;   KNEE ARTHROSCOPY W/ MENISCAL REPAIR Right    MASTECTOMY Left 2019   SENTINEL NODE BIOPSY Left 01/17/2019   Procedure: SENTINEL NODE INJECTION;  Surgeon: Sung Amabile, DO;  Location: [facility] ORS;  Service: General;  Laterality: Left;   TONSILLECTOMY     TOTAL MASTECTOMY Left 01/17/2019   Procedure: TOTAL MASTECTOMY;  Surgeon: Sung Amabile, DO;  Location: [facility] ORS;  Service: General;  Laterality: Left;    Social History Social History   Tobacco Use   Smoking status: Former    Current packs/day: 0.00    Types: Cigarettes    Quit date: 12/14/1977    Years since quitting: 46.2   Smokeless tobacco: Never  Vaping Use   Vaping status: Never Used  Substance Use Topics   Alcohol use: No   Drug use: No    Family History Family History  Problem Relation Age of Onset   Lung cancer Mother        smoker; deceased 18   Heart attack Father        deceased 66   Melanoma Brother 46  currently 74   Prostate cancer Brother 59       currently 58   Melanoma Son 45       below left armpit; currently 52   Breast cancer Neg Hx     Allergies  Allergen Reactions   Atorvastatin Other (See Comments)    Cramps   Naproxen Sodium Other (See Comments)    Head and Neck Puritis     REVIEW OF SYSTEMS (Negative unless checked)  Constitutional: [] Weight loss  [] Fever  [] Chills Cardiac: [] Chest pain   [] Chest pressure   [] Palpitations   [] Shortness of breath when laying flat   [] Shortness of breath with exertion. Vascular:  [] Pain in legs with walking   [x] Pain in legs with standing  [] History of DVT   [] Phlebitis   [x] Swelling in legs   [] Varicose veins   [] Non-healing ulcers Pulmonary:   [] Uses home oxygen   [] Productive cough    [] Hemoptysis   [] Wheeze  [] COPD   [] Asthma Neurologic:  [] Dizziness   [] Seizures   [] History of stroke   [] History of TIA  [] Aphasia   [] Vissual changes   [] Weakness or numbness in arm   [] Weakness or numbness in leg Musculoskeletal:   [] Joint swelling   [] Joint pain   [] Low back pain Hematologic:  [] Easy bruising  [] Easy bleeding   [] Hypercoagulable state   [] Anemic Gastrointestinal:  [] Diarrhea   [] Vomiting  [] Gastroesophageal reflux/heartburn   [] Difficulty swallowing. Genitourinary:  [] Chronic kidney disease   [] Difficult urination  [] Frequent urination   [] Blood in urine Skin:  [] Rashes   [] Ulcers  Psychological:  [] History of anxiety   []  History of major depression.  Physical Examination  There were no vitals filed for this visit. There is no height or weight on file to calculate BMI. Gen: WD/WN, NAD Head: [place]/AT, No temporalis wasting.  Ear/Nose/Throat: Hearing grossly intact, nares w/o erythema or drainage, pinna without lesions Eyes: PER, EOMI, sclera nonicteric.  Neck: Supple, no gross masses.  No JVD.  Pulmonary:  Good air movement, no audible wheezing, no use of accessory muscles.  Cardiac: RRR, precordium not hyperdynamic. Vascular:  scattered varicosities present bilaterally.  Mild venous stasis changes to the legs bilaterally.  3-4+ soft pitting edema, CEAP C4sEpAsPr  Vessel Right Left  Radial Palpable Palpable  Gastrointestinal: soft, non-distended. No guarding/no peritoneal signs.  Musculoskeletal: M/S 5/5 throughout.  No deformity.  Neurologic: CN 2-12 intact. Pain and light touch intact in extremities.  Symmetrical.  Speech is fluent. Motor exam as listed above. Psychiatric: Judgment intact, Mood & affect appropriate for pt's clinical situation. Dermatologic: Venous rashes no ulcers noted.  No changes consistent with cellulitis. Lymph : No lichenification or skin changes of chronic lymphedema.  CBC Lab Results  Component Value Date   WBC 6.9 10/20/2021   HGB 13.8  10/20/2021   HCT 42.1 10/20/2021   MCV 91.3 10/20/2021   PLT 257 10/20/2021    BMET    Component Value Date/Time   NA 137 10/20/2021 1426   NA 139 12/11/2013 0604   K 3.5 10/20/2021 1426   K 4.2 12/11/2013 0604   CL 97 (L) 10/20/2021 1426   CL 106 12/11/2013 0604   CO2 31 10/20/2021 1426   CO2 26 12/11/2013 0604   GLUCOSE 105 (H) 10/20/2021 1426   GLUCOSE 117 (H) 12/11/2013 0604   BUN 21 10/20/2021 1426   BUN 24 (H) 12/11/2013 0604   CREATININE 1.00 10/20/2021 1426   CREATININE 1.18 02/12/2014 1101   CALCIUM 9.7 10/20/2021 1426  CALCIUM 9.8 12/11/2013 0604   GFRNONAA 58 (L) 10/20/2021 1426   GFRNONAA 47 (L) 02/12/2014 1101   GFRAA >60 06/24/2020 1304   GFRAA 54 (L) 02/12/2014 1101   CrCl cannot be calculated (Patient's most recent lab result is older than the maximum 21 days allowed.).  COAG No results found for: "INR", "PROTIME"  Radiology No results found.   Assessment/Plan 1. Pain and swelling of lower leg, unspecified laterality (Primary) Recommend:  No surgery or intervention at this point in time.   The Patient is CEAP C4sEpAsPr.  The patient has been wearing compression for more than 12 weeks with no or little benefit.  The patient has been exercising daily for more than 12 weeks. The patient has been elevating and taking OTC pain medications for more than 12 weeks.  None of these have have eliminated the pain related to the lymphedema or the discomfort regarding excessive swelling and venous congestion.    I have reviewed my discussion with the patient regarding lymphedema and why it  causes symptoms.  Patient will continue wearing graduated compression on a daily basis. The patient should put the compression on first thing in the morning and removing them in the evening. The patient should not sleep in the compression.   In addition, behavioral modification throughout the day will be continued.  This will include frequent elevation (such as in a recliner),  use of over the counter pain medications as needed and exercise such as walking.  The systemic causes for chronic edema such as liver, kidney and cardiac etiologies do not appear to have significant changed over the past year.    The patient has chronic , severe lymphedema with hyperpigmentation of the skin and has done MLD, skin care, medication, diet, exercise, elevation and compression for 4 weeks with no improvement,  I am recommending a lymphedema pump.  The patient still has stage 3 lymphedema and therefore, I believe that a lymph pump is needed to improve the control of the patient's lymphedema and improve the quality of life.  Additionally, a lymph pump is warranted because it will reduce the risk of cellulitis and ulceration in the future.  Patient should follow-up in six months   2. Lymphedema Recommend:  No surgery or intervention at this point in time.   The Patient is CEAP C4sEpAsPr.  The patient has been wearing compression for more than 12 weeks with no or little benefit.  The patient has been exercising daily for more than 12 weeks. The patient has been elevating and taking OTC pain medications for more than 12 weeks.  None of these have have eliminated the pain related to the lymphedema or the discomfort regarding excessive swelling and venous congestion.    I have reviewed my discussion with the patient regarding lymphedema and why it  causes symptoms.  Patient will continue wearing graduated compression on a daily basis. The patient should put the compression on first thing in the morning and removing them in the evening. The patient should not sleep in the compression.   In addition, behavioral modification throughout the day will be continued.  This will include frequent elevation (such as in a recliner), use of over the counter pain medications as needed and exercise such as walking.  The systemic causes for chronic edema such as liver, kidney and cardiac etiologies do not  appear to have significant changed over the past year.    The patient has chronic , severe lymphedema with hyperpigmentation of the skin  and has done MLD, skin care, medication, diet, exercise, elevation and compression for 4 weeks with no improvement,  I am recommending a lymphedema pump.  The patient still has stage 3 lymphedema and therefore, I believe that a lymph pump is needed to improve the control of the patient's lymphedema and improve the quality of life.  Additionally, a lymph pump is warranted because it will reduce the risk of cellulitis and ulceration in the future.  Patient should follow-up in six months   3. Mixed hyperlipidemia Continue statin as ordered and reviewed, no changes at this time  4. Degeneration of intervertebral disc of lumbar region with lower extremity pain Continue medications to treat the patient's degenerative disease as already ordered, these medications have been reviewed and there are no changes at this time.  Continued activity and therapy was stressed.  5. Primary hypertension Continue antihypertensive medications as already ordered, these medications have been reviewed and there are no changes at this time.    Levora Dredge, MD  03/15/2024 4:03 PM

## 2024-03-16 ENCOUNTER — Encounter (INDEPENDENT_AMBULATORY_CARE_PROVIDER_SITE_OTHER): Payer: Self-pay | Admitting: Vascular Surgery

## 2024-03-16 ENCOUNTER — Ambulatory Visit (INDEPENDENT_AMBULATORY_CARE_PROVIDER_SITE_OTHER): Payer: Medicare (Managed Care) | Admitting: Vascular Surgery

## 2024-03-16 VITALS — BP 157/86 | HR 77 | Resp 16 | Ht 65.5 in | Wt 218.2 lb

## 2024-03-16 DIAGNOSIS — E782 Mixed hyperlipidemia: Secondary | ICD-10-CM

## 2024-03-16 DIAGNOSIS — M7989 Other specified soft tissue disorders: Secondary | ICD-10-CM

## 2024-03-16 DIAGNOSIS — M79669 Pain in unspecified lower leg: Secondary | ICD-10-CM

## 2024-03-16 DIAGNOSIS — I10 Essential (primary) hypertension: Secondary | ICD-10-CM

## 2024-03-16 DIAGNOSIS — M51361 Other intervertebral disc degeneration, lumbar region with lower extremity pain only: Secondary | ICD-10-CM | POA: Diagnosis not present

## 2024-03-16 DIAGNOSIS — I890 Lymphedema, not elsewhere classified: Secondary | ICD-10-CM

## 2024-03-19 ENCOUNTER — Encounter (INDEPENDENT_AMBULATORY_CARE_PROVIDER_SITE_OTHER): Payer: Self-pay | Admitting: Vascular Surgery

## 2024-05-02 ENCOUNTER — Encounter (INDEPENDENT_AMBULATORY_CARE_PROVIDER_SITE_OTHER): Payer: Self-pay

## 2024-05-17 DIAGNOSIS — N132 Hydronephrosis with renal and ureteral calculous obstruction: Secondary | ICD-10-CM | POA: Diagnosis not present

## 2024-05-17 DIAGNOSIS — K5900 Constipation, unspecified: Secondary | ICD-10-CM | POA: Diagnosis not present

## 2024-05-17 DIAGNOSIS — R1030 Lower abdominal pain, unspecified: Secondary | ICD-10-CM | POA: Diagnosis present

## 2024-05-18 ENCOUNTER — Emergency Department
Admission: EM | Admit: 2024-05-18 | Discharge: 2024-05-18 | Disposition: A | Discharge status: Home / Self Care | Payer: Medicare (Managed Care) | Attending: Emergency Medicine | Admitting: Emergency Medicine

## 2024-05-18 ENCOUNTER — Other Ambulatory Visit: Payer: Self-pay

## 2024-05-18 ENCOUNTER — Emergency Department: Payer: Medicare (Managed Care)

## 2024-05-18 DIAGNOSIS — R1030 Lower abdominal pain, unspecified: Secondary | ICD-10-CM

## 2024-05-18 DIAGNOSIS — N132 Hydronephrosis with renal and ureteral calculous obstruction: Secondary | ICD-10-CM

## 2024-05-18 DIAGNOSIS — K5900 Constipation, unspecified: Secondary | ICD-10-CM

## 2024-05-18 LAB — CBC WITH DIFFERENTIAL/PLATELET
Abs Immature Granulocytes: 0.02 10*3/uL (ref 0.00–0.07)
Basophils Absolute: 0 10*3/uL (ref 0.0–0.1)
Basophils Relative: 0 %
Eosinophils Absolute: 0.2 10*3/uL (ref 0.0–0.5)
Eosinophils Relative: 4 %
HCT: 39.1 % (ref 36.0–46.0)
Hemoglobin: 12.4 g/dL (ref 12.0–15.0)
Immature Granulocytes: 0 %
Lymphocytes Relative: 24 %
Lymphs Abs: 1.4 10*3/uL (ref 0.7–4.0)
MCH: 27.7 pg (ref 26.0–34.0)
MCHC: 31.7 g/dL (ref 30.0–36.0)
MCV: 87.5 fL (ref 80.0–100.0)
Monocytes Absolute: 0.7 10*3/uL (ref 0.1–1.0)
Monocytes Relative: 12 %
Neutro Abs: 3.4 10*3/uL (ref 1.7–7.7)
Neutrophils Relative %: 60 %
Platelets: 210 10*3/uL (ref 150–400)
RBC: 4.47 MIL/uL (ref 3.87–5.11)
RDW: 16.5 % — ABNORMAL HIGH (ref 11.5–15.5)
WBC: 5.7 10*3/uL (ref 4.0–10.5)
nRBC: 0 % (ref 0.0–0.2)

## 2024-05-18 LAB — LIPASE, BLOOD: Lipase: 49 U/L (ref 11–51)

## 2024-05-18 LAB — COMPREHENSIVE METABOLIC PANEL WITH GFR
ALT: 10 U/L (ref 0–44)
AST: 18 U/L (ref 15–41)
Albumin: 4.1 g/dL (ref 3.5–5.0)
Alkaline Phosphatase: 71 U/L (ref 38–126)
Anion gap: 7 (ref 5–15)
BUN: 15 mg/dL (ref 8–23)
CO2: 30 mmol/L (ref 22–32)
Calcium: 9.6 mg/dL (ref 8.9–10.3)
Chloride: 103 mmol/L (ref 98–111)
Creatinine, Ser: 0.96 mg/dL (ref 0.44–1.00)
GFR, Estimated: 60 mL/min — ABNORMAL LOW (ref >60)
Glucose, Bld: 116 mg/dL — ABNORMAL HIGH (ref 70–99)
Potassium: 3.7 mmol/L (ref 3.5–5.1)
Sodium: 140 mmol/L (ref 135–145)
Total Bilirubin: 0.9 mg/dL (ref 0.0–1.2)
Total Protein: 7 g/dL (ref 6.5–8.1)

## 2024-05-18 LAB — URINALYSIS, ROUTINE W REFLEX MICROSCOPIC
Bilirubin Urine: NEGATIVE
Glucose, UA: NEGATIVE mg/dL
Ketones, ur: NEGATIVE mg/dL
Nitrite: NEGATIVE
Protein, ur: 30 mg/dL — AB
RBC / HPF: >50 RBC/hpf (ref 0–5)
Specific Gravity, Urine: 1.01 (ref 1.005–1.030)
WBC, UA: >50 WBC/hpf (ref 0–5)
pH: 6 (ref 5.0–8.0)

## 2024-05-18 MED ORDER — ONDANSETRON 4 MG PO TBDP: ORAL_TABLET | ORAL | 0 refills | Status: DC

## 2024-05-18 MED ORDER — HYDROCODONE-ACETAMINOPHEN 5-325 MG PO TABS: 2.0000 | ORAL_TABLET | Freq: Four times a day (QID) | ORAL | 0 refills | Status: DC | PRN

## 2024-05-18 MED ORDER — KETOROLAC TROMETHAMINE 30 MG/ML IJ SOLN
15.0000 mg | Freq: Once | INTRAMUSCULAR | Status: AC
  Administered 2024-05-18: 15 mg via INTRAVENOUS
  Filled 2024-05-18: qty 1

## 2024-05-18 MED ORDER — TAMSULOSIN HCL 0.4 MG PO CAPS
ORAL_CAPSULE | ORAL | 0 refills | Status: DC
Start: 2024-05-18 — End: 2024-06-01

## 2024-05-18 MED ORDER — MORPHINE SULFATE (PF) 4 MG/ML IV SOLN
4.0000 mg | Freq: Once | INTRAVENOUS | Status: AC
  Administered 2024-05-18: 4 mg via INTRAVENOUS
  Filled 2024-05-18: qty 1

## 2024-05-18 MED ORDER — IOHEXOL 300 MG/ML  SOLN
100.0000 mL | Freq: Once | INTRAMUSCULAR | Status: AC | PRN
  Administered 2024-05-18: 100 mL via INTRAVENOUS

## 2024-05-18 MED ORDER — ONDANSETRON HCL 4 MG/2ML IJ SOLN
4.0000 mg | INTRAMUSCULAR | Status: AC
  Administered 2024-05-18: 4 mg via INTRAVENOUS
  Filled 2024-05-18: qty 2

## 2024-05-18 MED ORDER — METOCLOPRAMIDE HCL 5 MG/ML IJ SOLN
10.0000 mg | INTRAMUSCULAR | Status: AC
  Administered 2024-05-18: 10 mg via INTRAVENOUS
  Filled 2024-05-18: qty 2

## 2024-05-18 NOTE — ED Triage Notes (Signed)
 Pt reports lower abd pain that began a few hours ago, pt states she has not had a BM since Sunday. Pt reports some nausea and hematuria that she has had for 2 months, denies blood thinner use.

## 2024-05-18 NOTE — ED Provider Notes (Signed)
 [facility] Provider Note    Event Date/Time   First MD Initiated Contact with Patient 05/18/24 0011     (approximate)   History   Abdominal Pain   HPI Jennifer Maddox is a 78 y.o. female who presents for evaluation of lower abdominal pain.  It started a few hours ago and has steadily gotten worse.  It is constant but also comes in waves.  She has not had a bowel movement for about 3 days.  She has had some nausea but no vomiting.  She has also noted blood in her urine that has been going on for about 2 months.  She has seen her primary care doctor and a GYN doctor.  She has an appointment with urology within a week or so.  She said the pain is dull aching pain but also it is almost like a cramp that hits occasionally.  She has had a cholecystectomy but has not had an appendectomy.  She has had a hysterectomy in the past as well.  No history of kidney stones.     Physical Exam   Triage Vital Signs: ED Triage Vitals  Encounter Vitals Group     BP 05/18/24 0007 (!) 176/62     Systolic BP Percentile --      Diastolic BP Percentile --      Pulse Rate 05/18/24 0007 91     Resp 05/18/24 0007 20     Temp 05/18/24 0007 98.7 F (37.1 C)     Temp src --      SpO2 05/18/24 0007 99 %     Weight 05/18/24 0006 93.9 kg (207 lb)     Height 05/18/24 0006 1.651 m (5\' 5" )     Head Circumference --      Peak Flow --      Pain Score 05/18/24 0006 7     Pain Loc --      Pain Education --      Exclude from Growth Chart --     Most recent vital signs: Vitals:   05/18/24 0007  BP: (!) 176/62  Pulse: 91  Resp: 20  Temp: 98.7 F (37.1 C)  SpO2: 99%    General: Awake, appears uncomfortable but nontoxic. CV:  Good peripheral perfusion.  Regular rate and rhythm. Resp:  Normal effort. Speaking easily and comfortably, no accessory muscle usage nor intercostal retractions.   Abd:  No distention.  Tender to palpation in the lower abdomen without localized peritonitis.   No epigastric nor right upper quadrant tenderness.  Some guarding.   ED Results / Procedures / Treatments   Labs (all labs ordered are listed, but only abnormal results are displayed) Labs Reviewed  CBC WITH DIFFERENTIAL/PLATELET - Abnormal; Notable for the following components:      Result Value   RDW 16.5 (*)    All other components within normal limits  COMPREHENSIVE METABOLIC PANEL WITH GFR - Abnormal; Notable for the following components:   Glucose, Bld 116 (*)    GFR, Estimated 60 (*)    All other components within normal limits  URINALYSIS, ROUTINE W REFLEX MICROSCOPIC - Abnormal; Notable for the following components:   Color, Urine YELLOW (*)    APPearance HAZY (*)    Hgb urine dipstick LARGE (*)    Protein, ur 30 (*)    Leukocytes,Ua LARGE (*)    Bacteria, UA FEW (*)    All other components within normal limits  URINE CULTURE  LIPASE, BLOOD      RADIOLOGY See ED course for details   PROCEDURES:  Critical Care performed: No  Procedures    IMPRESSION / MDM / ASSESSMENT AND PLAN / ED COURSE  I reviewed the triage vital signs and the nursing notes.                              Differential diagnosis includes, but is not limited to, UTI/pyelonephritis, diverticulitis, intra-abdominal abscess, constipation, neoplastic disease.  Patient's presentation is most consistent with acute presentation with potential threat to life or bodily function.  Labs/studies ordered: CT of the abdomen and pelvis, CMP, CBC with differential, lipase, urinalysis, urine culture  Interventions/Medications given:  Medications  morphine  (PF) 4 MG/ML injection 4 mg (4 mg Intravenous Given 05/18/24 0130)  ondansetron  (ZOFRAN ) injection 4 mg (4 mg Intravenous Given 05/18/24 0130)  iohexol (OMNIPAQUE) 300 MG/ML solution 100 mL (100 mLs Intravenous Contrast Given 05/18/24 0257)  ketorolac (TORADOL) 30 MG/ML injection 15 mg (15 mg Intravenous Given 05/18/24 0332)  metoCLOPramide (REGLAN)  injection 10 mg (10 mg Intravenous Given 05/18/24 0333)    (Note:  hospital course my include additional interventions and/or labs/studies not listed above.)   Vital signs notable for hypertension but otherwise normal.  Patient is obviously uncomfortable and I ordered morphine  4 mg IV and Zofran  4 mg IV.  No obvious need for fluids at this time.  Labs are all essentially normal.  Urinalysis notable for hematuria without obvious infection, but I am ordering a urine culture to evaluate further.  She may simply be having some chronic hematuria for which she is going to follow-up as an outpatient in the setting of constipation which is causing the acute abdominal pain, but we will evaluate with CT of the abdomen and pelvis for further assessment and restratification.  She understands and agrees with the plan   Clinical Course as of 05/18/24 0349  Thu May 18, 2024  0344 CT ABDOMEN PELVIS W CONTRAST I independently viewed and interpreted the patient's CT of the abdomen and pelvis.  She has extensive stool throughout the colon but no sign of bowel obstruction.  She also has obvious right-sided hydronephrosis.  The radiologist pointed out a 6 mm right-sided ureteral stone which does correlate clinically and explain basically all of her symptoms. [CF]  0345 I reassessed the patient.  She feels better after a dose of Toradol 50 mg IV and Reglan 10 mg IV.  I talked with her and her daughter at length about diagnoses, constipation, the role of opioids with constipation, aggressive bowel regimen, close urology follow-up, etc.  They are all comfortable and in agreement with the plan for outpatient management.  I gave strict return precautions should she show any signs of infection, fever, etc., but at this point there is no indication she needs antibiotics.  The patient's medical screening exam is reassuring with no indication of an emergent medical condition requiring hospitalization or additional evaluation at this  point.  The patient is safe and appropriate for discharge and outpatient follow up. [CF]    Clinical Course User Index [CF] Lynnda Sas, MD     FINAL CLINICAL IMPRESSION(S) / ED DIAGNOSES   Final diagnoses:  Lower abdominal pain  Ureteral stone with hydronephrosis  Constipation, unspecified constipation type     Rx / DC Orders   ED Discharge Orders          Ordered    HYDROcodone -acetaminophen  (  NORCO/VICODIN) 5-325 MG tablet  Every 6 hours PRN        05/18/24 0348    ondansetron  (ZOFRAN -ODT) 4 MG disintegrating tablet        05/18/24 0348    tamsulosin (FLOMAX) 0.4 MG CAPS capsule        05/18/24 0348             Note:  This document was prepared using Dragon voice recognition software and may include unintentional dictation errors.   Lynnda Sas, MD 05/18/24 [id number]

## 2024-05-18 NOTE — Discharge Instructions (Addendum)
 You have been seen in the Emergency Department (ED) today for pain caused by kidney stones.  As we have discussed, please drink plenty of fluids.  Please make a follow up appointment with the physician(s) listed elsewhere in this documentation.  You may take pain medication as needed but ONLY as prescribed.  Please also take your prescribed Flomax daily.  If you are going to follow up with Urology for possible lithotripsy, please do not take any ibuprofen , naproxen, aspirin, Toradol, or other NSAID after Tuesday morning, as this may exclude you from lithotripsy.  Please see your doctor as soon as possible as stones may take 1-3 weeks to pass and you may require additional care or medications.  Do not drink alcohol, drive or participate in any other potentially dangerous activities while taking opiate pain medication as it may make you sleepy. Do not take this medication with any other sedating medications, either prescription or over-the-counter. If you were prescribed Percocet or Vicodin, do not take these with acetaminophen  (Tylenol ) as it is already contained within these medications.   Take Norco as needed for severe pain.  This medication is an opiate (or narcotic) pain medication and can be habit forming.  Use it as little as possible to achieve adequate pain control.  Do not use or use it with extreme caution if you have a history of opiate abuse or dependence.  If you are on a pain contract with your primary care doctor or a pain specialist, be sure to let them know you were prescribed this medication today from the [facility] Emergency Department.  This medication is intended for your use only - do not give any to anyone else and keep it in a secure place where nobody else, especially children, have access to it.    However, this can also cause worsening constipation, and as we discussed, you are already quite constipated.  We recommend you try using these over-the-counter medications to  help with your constipation.  You will likely need to use a combination of MiraLAX, docusate/Colace, and senna to be successful:  1)  Miralax (powder):  This medication works by drawing additional fluid into your intestines and helps to flush out your stool.  Mix the powder with water or juice according to label instructions.  Be sure to use the recommended amount of water or juice when you mix up the powder.  Plenty of fluids will help to prevent constipation. 2)  Colace (or Dulcolax) 100 mg:  This is a stool softener, and you may take it once or twice a day as needed. 3)  Senna tablets:  This is a bowel stimulant that will help "push" out your stool. It is the next step to add after you have tried a stool softener. 4)  Magnesium citrate: This is typically sold in a clear glass bottle also purchased without the need for prescription.  You can drink the bottle and it typically stimulates your bowels within a short period of time.  You may also want to consider using glycerin suppositories, which you insert into your rectum.  You hold it in place and is dissolves and softens your stool and stimulates your bowels.  You could also consider using an enema, which is also available over the counter.   Return to the Emergency Department (ED) or call your doctor if you have any worsening pain, fever, painful urination, are unable to urinate, or develop other symptoms that concern you.

## 2024-05-19 ENCOUNTER — Ambulatory Visit
Admission: RE | Admit: 2024-05-19 | Discharge: 2024-05-19 | Disposition: A | Discharge status: Home / Self Care | Payer: Medicare (Managed Care) | Source: Ambulatory Visit | Attending: Urology | Admitting: Urology

## 2024-05-19 ENCOUNTER — Other Ambulatory Visit: Payer: Self-pay

## 2024-05-19 ENCOUNTER — Ambulatory Visit: Payer: Medicare (Managed Care) | Admitting: Urology

## 2024-05-19 ENCOUNTER — Encounter: Payer: Self-pay | Admitting: Urology

## 2024-05-19 VITALS — BP 133/82 | HR 91 | Ht 65.0 in | Wt 210.0 lb

## 2024-05-19 DIAGNOSIS — N1330 Unspecified hydronephrosis: Secondary | ICD-10-CM

## 2024-05-19 DIAGNOSIS — N390 Urinary tract infection, site not specified: Secondary | ICD-10-CM | POA: Diagnosis present

## 2024-05-19 DIAGNOSIS — N132 Hydronephrosis with renal and ureteral calculous obstruction: Secondary | ICD-10-CM

## 2024-05-19 DIAGNOSIS — N23 Unspecified renal colic: Secondary | ICD-10-CM

## 2024-05-19 DIAGNOSIS — N201 Calculus of ureter: Secondary | ICD-10-CM | POA: Diagnosis not present

## 2024-05-19 LAB — URINE CULTURE

## 2024-05-19 LAB — URINALYSIS, COMPLETE
Bilirubin, UA: NEGATIVE
Glucose, UA: NEGATIVE
Ketones, UA: NEGATIVE
Nitrite, UA: NEGATIVE
Specific Gravity, UA: 1.01 (ref 1.005–1.030)
Urobilinogen, Ur: 0.2 mg/dL (ref 0.2–1.0)
pH, UA: 6 (ref 5.0–7.5)

## 2024-05-19 LAB — MICROSCOPIC EXAMINATION
Epithelial Cells (non renal): >10 /HPF — AB (ref 0–10)
RBC, Urine: >30 /HPF — AB (ref 0–2)
WBC, UA: >30 /HPF — AB (ref 0–5)

## 2024-05-19 NOTE — H&P (View-Only) (Signed)
 I, Jennifer Maddox, acting as a scribe for Jennifer Knapp, MD., have documented all relevant documentation on the behalf of Jennifer Knapp, MD, as directed by Jennifer Knapp, MD while in the presence of Jennifer Knapp, MD.  05/19/2024 4:30 PM   Jennifer Maddox 11-Nov-1946 [id number]  Referring provider: Lyle San, MD [address] [facility] [place],  [place] [address]  Chief Complaint  Patient presents with   Urinary Tract Infection    HPI: Jennifer Maddox is a 78 y.o. female referred for evaluation of microhematuria.   Intermittent gross hematuria for approximately 2 months.  Recently saw her GYN for vaginal spotting, which was felt to be more urinary in origin. Urinalysis showed >182 RBCs and a prior urinalysis in April showed pyuria and microhematuria.  Earlier this week she had onset a right lower quadrant abdominal pain and was seen in the ED for progressive worsening of her pain. She had nausea without vomiting. Denied fever or chills. ACT abdomen/pelvis with contrast was performed, which showed an 8 mm right mid-ureteral calculus with moderate hydronephrosis/hydroureter.  Pain was controlled with parental analgesics, and she was discharged on hydrocodone , tamsulosin and Zofran .  Her pain has been mild since her ED visit and she has not required a narcotic pain medication.  No prior history of stone disease.  Urine culture in the ED grew multiple species.  Urinalysis showed >50 RBC/>50 WBC and only 0-5 squamous epithelial cells.    PMH: Past Medical History:  Diagnosis Date   Anemia    Arthritis    Breast cancer (HCC) 2011/2019   left breast   Breast cancer, left breast (HCC) 2011   COPD (chronic obstructive pulmonary disease) (HCC)    Degenerative disc disease, lumbar    Diabetes mellitus without complication (HCC)    Diverticulosis    Hyperlipidemia    Hypertension    Personal history of radiation therapy    Sleep apnea    TIA (transient ischemic  attack)     Surgical History: Past Surgical History:  Procedure Laterality Date   ABDOMINAL HYSTERECTOMY     around age 88 ; ovaries intact   BREAST BIOPSY Left 12/01/2018   Pathology LEFT breast mass 11 o'clock location: Invasive mammary   BREAST EXCISIONAL BIOPSY Left 2011   breast ca with radation   CHOLECYSTECTOMY     COLONOSCOPY W/ POLYPECTOMY     COLONOSCOPY WITH PROPOFOL  N/A 10/29/2017   Procedure: COLONOSCOPY WITH PROPOFOL ;  Surgeon: Cassie Click, MD;  Location: [facility] ENDOSCOPY;  Service: Endoscopy;  Laterality: N/A;   COLONOSCOPY WITH PROPOFOL  N/A 07/24/2022   Procedure: COLONOSCOPY WITH PROPOFOL ;  Surgeon: Shane Darling, MD;  Location: [facility] ENDOSCOPY;  Service: Endoscopy;  Laterality: N/A;   ESOPHAGOGASTRODUODENOSCOPY (EGD) WITH PROPOFOL  N/A 10/29/2017   Procedure: ESOPHAGOGASTRODUODENOSCOPY (EGD) WITH PROPOFOL ;  Surgeon: Cassie Click, MD;  Location: [facility] ENDOSCOPY;  Service: Endoscopy;  Laterality: N/A;   KNEE ARTHROSCOPY W/ MENISCAL REPAIR Right    MASTECTOMY Left 2019   SENTINEL NODE BIOPSY Left 01/17/2019   Procedure: SENTINEL NODE INJECTION;  Surgeon: Conrado Delay, DO;  Location: [facility] ORS;  Service: General;  Laterality: Left;   TONSILLECTOMY     TOTAL MASTECTOMY Left 01/17/2019   Procedure: TOTAL MASTECTOMY;  Surgeon: Conrado Delay, DO;  Location: [facility] ORS;  Service: General;  Laterality: Left;    Home Medications:  Allergies as of 05/19/2024       Reactions   Atorvastatin Other (See  Comments)   Cramps   Naproxen Sodium Other (See Comments)   Head and Neck Puritis        Medication List        Accurate as of May 19, 2024  4:30 PM. If you have any questions, ask your nurse or doctor.          acetaminophen  500 MG tablet Commonly known as: TYLENOL  Take by mouth.   amLODipine 2.5 MG tablet Commonly known as: NORVASC Take 2.5 mg by mouth daily.   anastrozole  1 MG tablet Commonly known as: ARIMIDEX  Take 1 tablet (1 mg total) by mouth  daily.   blood glucose meter kit and supplies USE TWICE DAILY   Cetirizine HCl 10 MG Caps Take by mouth.   clobetasol ointment 0.05 % Commonly known as: TEMOVATE Apply 1 application topically daily as needed (itching).   cyanocobalamin  1000 MCG tablet Commonly known as: VITAMIN B12 Take 1,000 mcg by mouth daily.   DropSafe Alcohol Prep 70 % Pads APPLY TOPICALLY EVERY DAY   DSS 250 MG Caps Take by mouth.   furosemide  20 MG tablet Commonly known as: LASIX  Take 20 mg by mouth 2 (two) times daily.   gabapentin  300 MG capsule Commonly known as: NEURONTIN  Take 300 mg by mouth at bedtime.   glucose blood test strip   hydrALAZINE 10 MG tablet Commonly known as: APRESOLINE Take 10 mg by mouth 2 (two) times daily.   HYDROcodone -acetaminophen  5-325 MG tablet Commonly known as: NORCO/VICODIN Take 2 tablets by mouth every 6 (six) hours as needed for moderate pain (pain score 4-6) or severe pain (pain score 7-10).   Hyzaar 100-12.5 MG tablet Generic drug: losartan -hydrochlorothiazide Take 1 tablet by mouth daily.   losartan -hydrochlorothiazide 100-12.5 MG tablet Commonly known as: HYZAAR Take 1 tablet by mouth daily.   MELATONIN PO Take 12 mg by mouth at bedtime.   MULTIVITAMIN ADULT PO Take 1 tablet by mouth daily.   ondansetron  4 MG disintegrating tablet Commonly known as: ZOFRAN -ODT Allow 1-2 tablets to dissolve in your mouth every 8 hours as needed for nausea/vomiting   Ozempic (0.25 or 0.5 MG/DOSE) 2 MG/3ML Sopn Generic drug: Semaglutide(0.25 or 0.5MG /DOS)   pioglitazone 15 MG tablet Commonly known as: ACTOS Take 15 mg by mouth daily.   potassium chloride 10 MEQ tablet Commonly known as: KLOR-CON M Take 1 tablet by mouth daily at 12 noon.   Klor-Con M10 10 MEQ tablet Generic drug: potassium chloride Take 1 tablet by mouth daily.   rosuvastatin 40 MG tablet Commonly known as: CRESTOR Take 40 mg by mouth daily.   tamsulosin 0.4 MG Caps  capsule Commonly known as: FLOMAX Take 1 tablet by mouth daily until you pass the kidney stone or no longer have symptoms   traZODone  100 MG tablet Commonly known as: DESYREL  Take 150 mg by mouth at bedtime.   TRUEplus Lancets 28G Misc TEST BLOOD SUGAR AS DIRECTED        Allergies:  Allergies  Allergen Reactions   Atorvastatin Other (See Comments)    Cramps   Naproxen Sodium Other (See Comments)    Head and Neck Puritis    Family History: Family History  Problem Relation Age of Onset   Lung cancer Mother        smoker; deceased 43   Heart attack Father        deceased 66   Melanoma Brother 34       currently 45   Prostate cancer Brother 17  currently 75   Melanoma Son 45       below left armpit; currently 14   Breast cancer Neg Hx     Social History:  reports that she quit smoking about 46 years ago. Her smoking use included cigarettes. She has never used smokeless tobacco. She reports that she does not drink alcohol and does not use drugs.   Physical Exam: BP 133/82   Pulse 91   Ht 5\' 5"  (1.651 m)   Wt 210 lb (95.3 kg)   BMI 34.95 kg/m   Constitutional:  Alert and oriented, No acute distress. HEENT: [place] AT Respiratory: Normal respiratory effort, no increased work of breathing. Psychiatric: Normal mood and affect.   Pertinent Imaging: CT abdomen pelvis with contrast performed, 05/18/2024 was personally reviewed and interpreted. Agreed with the radiology interpretation  CT  EXAM: CT ABDOMEN AND PELVIS WITH CONTRAST   TECHNIQUE: Multidetector CT imaging of the abdomen and pelvis was performed using the standard protocol following bolus administration of intravenous contrast.   RADIATION DOSE REDUCTION: This exam was performed according to the departmental dose-optimization program which includes automated exposure control, adjustment of the mA and/or kV according to patient size and/or use of iterative reconstruction technique.   CONTRAST:   OMNIPAQUE IOHEXOL 300 MG/ML  SOLN   COMPARISON:  10/22/2017   FINDINGS: Lower chest: Lung bases are clear.   Hepatobiliary: Liver is within normal limits.   Status post cholecystectomy. No intrahepatic or extrahepatic ductal dilatation.   Pancreas: Within normal limits.   Spleen: Within normal limits.   Adrenals/Urinary Tract: Stable 16 mm left adrenal nodule (image 21), unchanged, favoring a benign adrenal adenoma. Right adrenal glands are within normal limits.   Small bilateral renal cysts, measuring up to 13 mm in the right lower kidney (image 41), measuring simple fluid density, benign (Bosniak I). Left renal sinus cysts. No follow-up is recommended.   Moderate right hydroureteronephrosis, on the basis of a 6 mm mid right ureteral calculus at the level of the sacral ureter (image 67).   Bladder is underdistended but unremarkable.   Stomach/Bowel: Stomach is within normal limits.   No evidence of bowel obstruction.   Normal appendix (image 55).   No colonic wall thickening or inflammatory changes.   Vascular/Lymphatic: No evidence of abdominal aortic aneurysm.   Atherosclerotic calcifications of the abdominal aorta and branch vessels.   No suspicious abdominopelvic lymphadenopathy.   Reproductive: Status post hysterectomy.   Other: No abdominopelvic ascites.   Small fat containing right inguinal hernia.   Musculoskeletal: Degenerative changes of the visualized thoracolumbar spine.   IMPRESSION: 6 mm mid right ureteral calculus at the level of the sacral ureter. Associated moderate right hydroureteronephrosis.   Additional ancillary findings as above.     Electronically Signed   By: Zadie Herter M.D.   On: 05/18/2024 03:17  Assessment & Plan:    1. Right mid-ureteral calculus We discussed various treatment options for urolithiasis including observation with or without medical expulsive therapy, shockwave lithotripsy (SWL), ureteroscopy  and laser lithotripsy with stent placement. We discussed that management is based on stone size, location, density, patient co-morbidities, and patient preference.  Stones <51mm in size have a >80% spontaneous passage rate. Data surrounding the use of tamsulosin for medical expulsive therapy is controversial, but meta analyses suggests it is most efficacious for distal stones between 5-67mm in size. Possible side effects include dizziness/lightheadedness, and retrograde ejaculation. SWL has a lower stone free rate in a single procedure, but also  a lower complication rate compared to ureteroscopy and avoids a stent and associated stent related symptoms. Possible complications include renal hematoma, steinstrasse, and need for additional treatment. Ureteroscopy with laser lithotripsy and stent placement has a higher stone free rate than SWL in a single procedure, however increased complication rate including possible infection, ureteral injury, bleeding, and stent related morbidity. Common stent related symptoms include dysuria, urgency/frequency, and flank pain. KUB ordered to see if calculus visualized on plain X-ray.  She is undecided at this time of her preferred option, and will think this over. Will notify with KUB results.  She is on Ozempic and injected earlier today, so would not be able to have a procedure for at least 1 week.  I have reviewed the above documentation for accuracy and completeness, and I agree with the above.   Jennifer Knapp, MD  [facility] [address] [place], [place] [address] [phone]

## 2024-05-19 NOTE — Progress Notes (Signed)
 I, Maysun Jamey Mccallum, acting as a scribe for [name], MD., have documented all relevant documentation on the behalf of [name], MD, as directed by [name], MD while in the presence of [name], MD.  05/19/2024 4:30 PM   [name] 11-Nov-1946 [id number]  Referring provider: Lyle San, MD [address] [facility] [place],  [place] [address]  Chief Complaint  Patient presents with   Urinary Tract Infection    HPI: Jennifer Maddox is a 78 y.o. female referred for evaluation of microhematuria.   Intermittent gross hematuria for approximately 2 months.  Recently saw her GYN for vaginal spotting, which was felt to be more urinary in origin. Urinalysis showed >182 RBCs and a prior urinalysis in April showed pyuria and microhematuria.  Earlier this week she had onset a right lower quadrant abdominal pain and was seen in the ED for progressive worsening of her pain. She had nausea without vomiting. Denied fever or chills. ACT abdomen/pelvis with contrast was performed, which showed an 8 mm right mid-ureteral calculus with moderate hydronephrosis/hydroureter.  Pain was controlled with parental analgesics, and she was discharged on hydrocodone , tamsulosin and Zofran .  Her pain has been mild since her ED visit and she has not required a narcotic pain medication.  No prior history of stone disease.  Urine culture in the ED grew multiple species.  Urinalysis showed >50 RBC/>50 WBC and only 0-5 squamous epithelial cells.    PMH: Past Medical History:  Diagnosis Date   Anemia    Arthritis    Breast cancer (HCC) 2011/2019   left breast   Breast cancer, left breast (HCC) 2011   COPD (chronic obstructive pulmonary disease) (HCC)    Degenerative disc disease, lumbar    Diabetes mellitus without complication (HCC)    Diverticulosis    Hyperlipidemia    Hypertension    Personal history of radiation therapy    Sleep apnea    TIA (transient ischemic  attack)     Surgical History: Past Surgical History:  Procedure Laterality Date   ABDOMINAL HYSTERECTOMY     around age 88 ; ovaries intact   BREAST BIOPSY Left 12/01/2018   Pathology LEFT breast mass 11 o'clock location: Invasive mammary   BREAST EXCISIONAL BIOPSY Left 2011   breast ca with radation   CHOLECYSTECTOMY     COLONOSCOPY W/ POLYPECTOMY     COLONOSCOPY WITH PROPOFOL  N/A 10/29/2017   Procedure: COLONOSCOPY WITH PROPOFOL ;  Surgeon: Cassie Click, MD;  Location: [facility] ENDOSCOPY;  Service: Endoscopy;  Laterality: N/A;   COLONOSCOPY WITH PROPOFOL  N/A 07/24/2022   Procedure: COLONOSCOPY WITH PROPOFOL ;  Surgeon: Shane Darling, MD;  Location: [facility] ENDOSCOPY;  Service: Endoscopy;  Laterality: N/A;   ESOPHAGOGASTRODUODENOSCOPY (EGD) WITH PROPOFOL  N/A 10/29/2017   Procedure: ESOPHAGOGASTRODUODENOSCOPY (EGD) WITH PROPOFOL ;  Surgeon: Cassie Click, MD;  Location: [facility] ENDOSCOPY;  Service: Endoscopy;  Laterality: N/A;   KNEE ARTHROSCOPY W/ MENISCAL REPAIR Right    MASTECTOMY Left 2019   SENTINEL NODE BIOPSY Left 01/17/2019   Procedure: SENTINEL NODE INJECTION;  Surgeon: Conrado Delay, DO;  Location: [facility] ORS;  Service: General;  Laterality: Left;   TONSILLECTOMY     TOTAL MASTECTOMY Left 01/17/2019   Procedure: TOTAL MASTECTOMY;  Surgeon: Conrado Delay, DO;  Location: [facility] ORS;  Service: General;  Laterality: Left;    Home Medications:  Allergies as of 05/19/2024       Reactions   Atorvastatin Other (See  Comments)   Cramps   Naproxen Sodium Other (See Comments)   Head and Neck Puritis        Medication List        Accurate as of May 19, 2024  4:30 PM. If you have any questions, ask your nurse or doctor.          acetaminophen  500 MG tablet Commonly known as: TYLENOL  Take by mouth.   amLODipine 2.5 MG tablet Commonly known as: NORVASC Take 2.5 mg by mouth daily.   anastrozole  1 MG tablet Commonly known as: ARIMIDEX  Take 1 tablet (1 mg total) by mouth  daily.   blood glucose meter kit and supplies USE TWICE DAILY   Cetirizine HCl 10 MG Caps Take by mouth.   clobetasol ointment 0.05 % Commonly known as: TEMOVATE Apply 1 application topically daily as needed (itching).   cyanocobalamin  1000 MCG tablet Commonly known as: VITAMIN B12 Take 1,000 mcg by mouth daily.   DropSafe Alcohol Prep 70 % Pads APPLY TOPICALLY EVERY DAY   DSS 250 MG Caps Take by mouth.   furosemide  20 MG tablet Commonly known as: LASIX  Take 20 mg by mouth 2 (two) times daily.   gabapentin  300 MG capsule Commonly known as: NEURONTIN  Take 300 mg by mouth at bedtime.   glucose blood test strip   hydrALAZINE 10 MG tablet Commonly known as: APRESOLINE Take 10 mg by mouth 2 (two) times daily.   HYDROcodone -acetaminophen  5-325 MG tablet Commonly known as: NORCO/VICODIN Take 2 tablets by mouth every 6 (six) hours as needed for moderate pain (pain score 4-6) or severe pain (pain score 7-10).   Hyzaar 100-12.5 MG tablet Generic drug: losartan -hydrochlorothiazide Take 1 tablet by mouth daily.   losartan -hydrochlorothiazide 100-12.5 MG tablet Commonly known as: HYZAAR Take 1 tablet by mouth daily.   MELATONIN PO Take 12 mg by mouth at bedtime.   MULTIVITAMIN ADULT PO Take 1 tablet by mouth daily.   ondansetron  4 MG disintegrating tablet Commonly known as: ZOFRAN -ODT Allow 1-2 tablets to dissolve in your mouth every 8 hours as needed for nausea/vomiting   Ozempic (0.25 or 0.5 MG/DOSE) 2 MG/3ML Sopn Generic drug: Semaglutide(0.25 or 0.5MG /DOS)   pioglitazone 15 MG tablet Commonly known as: ACTOS Take 15 mg by mouth daily.   potassium chloride 10 MEQ tablet Commonly known as: KLOR-CON M Take 1 tablet by mouth daily at 12 noon.   Klor-Con M10 10 MEQ tablet Generic drug: potassium chloride Take 1 tablet by mouth daily.   rosuvastatin 40 MG tablet Commonly known as: CRESTOR Take 40 mg by mouth daily.   tamsulosin 0.4 MG Caps  capsule Commonly known as: FLOMAX Take 1 tablet by mouth daily until you pass the kidney stone or no longer have symptoms   traZODone  100 MG tablet Commonly known as: DESYREL  Take 150 mg by mouth at bedtime.   TRUEplus Lancets 28G Misc TEST BLOOD SUGAR AS DIRECTED        Allergies:  Allergies  Allergen Reactions   Atorvastatin Other (See Comments)    Cramps   Naproxen Sodium Other (See Comments)    Head and Neck Puritis    Family History: Family History  Problem Relation Age of Onset   Lung cancer Mother        smoker; deceased 43   Heart attack Father        deceased 66   Melanoma Brother 34       currently 45   Prostate cancer Brother 17  currently 75   Melanoma Son 45       below left armpit; currently 14   Breast cancer Neg Hx     Social History:  reports that she quit smoking about 46 years ago. Her smoking use included cigarettes. She has never used smokeless tobacco. She reports that she does not drink alcohol and does not use drugs.   Physical Exam: BP 133/82   Pulse 91   Ht 5\' 5"  (1.651 m)   Wt 210 lb (95.3 kg)   BMI 34.95 kg/m   Constitutional:  Alert and oriented, No acute distress. HEENT: [place] AT Respiratory: Normal respiratory effort, no increased work of breathing. Psychiatric: Normal mood and affect.   Pertinent Imaging: CT abdomen pelvis with contrast performed, 05/18/2024 was personally reviewed and interpreted. Agreed with the radiology interpretation  CT  EXAM: CT ABDOMEN AND PELVIS WITH CONTRAST   TECHNIQUE: Multidetector CT imaging of the abdomen and pelvis was performed using the standard protocol following bolus administration of intravenous contrast.   RADIATION DOSE REDUCTION: This exam was performed according to the departmental dose-optimization program which includes automated exposure control, adjustment of the mA and/or kV according to patient size and/or use of iterative reconstruction technique.   CONTRAST:   OMNIPAQUE IOHEXOL 300 MG/ML  SOLN   COMPARISON:  10/22/2017   FINDINGS: Lower chest: Lung bases are clear.   Hepatobiliary: Liver is within normal limits.   Status post cholecystectomy. No intrahepatic or extrahepatic ductal dilatation.   Pancreas: Within normal limits.   Spleen: Within normal limits.   Adrenals/Urinary Tract: Stable 16 mm left adrenal nodule (image 21), unchanged, favoring a benign adrenal adenoma. Right adrenal glands are within normal limits.   Small bilateral renal cysts, measuring up to 13 mm in the right lower kidney (image 41), measuring simple fluid density, benign (Bosniak I). Left renal sinus cysts. No follow-up is recommended.   Moderate right hydroureteronephrosis, on the basis of a 6 mm mid right ureteral calculus at the level of the sacral ureter (image 67).   Bladder is underdistended but unremarkable.   Stomach/Bowel: Stomach is within normal limits.   No evidence of bowel obstruction.   Normal appendix (image 55).   No colonic wall thickening or inflammatory changes.   Vascular/Lymphatic: No evidence of abdominal aortic aneurysm.   Atherosclerotic calcifications of the abdominal aorta and branch vessels.   No suspicious abdominopelvic lymphadenopathy.   Reproductive: Status post hysterectomy.   Other: No abdominopelvic ascites.   Small fat containing right inguinal hernia.   Musculoskeletal: Degenerative changes of the visualized thoracolumbar spine.   IMPRESSION: 6 mm mid right ureteral calculus at the level of the sacral ureter. Associated moderate right hydroureteronephrosis.   Additional ancillary findings as above.     Electronically Signed   By: Zadie Herter M.D.   On: 05/18/2024 03:17  Assessment & Plan:    1. Right mid-ureteral calculus We discussed various treatment options for urolithiasis including observation with or without medical expulsive therapy, shockwave lithotripsy (SWL), ureteroscopy  and laser lithotripsy with stent placement. We discussed that management is based on stone size, location, density, patient co-morbidities, and patient preference.  Stones <51mm in size have a >80% spontaneous passage rate. Data surrounding the use of tamsulosin for medical expulsive therapy is controversial, but meta analyses suggests it is most efficacious for distal stones between 5-67mm in size. Possible side effects include dizziness/lightheadedness, and retrograde ejaculation. SWL has a lower stone free rate in a single procedure, but also  a lower complication rate compared to ureteroscopy and avoids a stent and associated stent related symptoms. Possible complications include renal hematoma, steinstrasse, and need for additional treatment. Ureteroscopy with laser lithotripsy and stent placement has a higher stone free rate than SWL in a single procedure, however increased complication rate including possible infection, ureteral injury, bleeding, and stent related morbidity. Common stent related symptoms include dysuria, urgency/frequency, and flank pain. KUB ordered to see if calculus visualized on plain X-ray.  She is undecided at this time of her preferred option, and will think this over. Will notify with KUB results.  She is on Ozempic and injected earlier today, so would not be able to have a procedure for at least 1 week.  I have reviewed the above documentation for accuracy and completeness, and I agree with the above.   [name], MD  [facility] [address] [place], [place] [address] [phone]

## 2024-05-20 ENCOUNTER — Emergency Department (HOSPITAL_COMMUNITY)
Admission: EM | Admit: 2024-05-20 | Discharge: 2024-05-20 | Disposition: A | Discharge status: Home / Self Care | Payer: Medicare (Managed Care) | Attending: Emergency Medicine | Admitting: Emergency Medicine

## 2024-05-20 ENCOUNTER — Other Ambulatory Visit: Payer: Self-pay

## 2024-05-20 ENCOUNTER — Emergency Department (HOSPITAL_COMMUNITY): Payer: Medicare (Managed Care)

## 2024-05-20 DIAGNOSIS — K5900 Constipation, unspecified: Secondary | ICD-10-CM | POA: Diagnosis not present

## 2024-05-20 DIAGNOSIS — I10 Essential (primary) hypertension: Secondary | ICD-10-CM | POA: Diagnosis not present

## 2024-05-20 DIAGNOSIS — E119 Type 2 diabetes mellitus without complications: Secondary | ICD-10-CM | POA: Diagnosis not present

## 2024-05-20 DIAGNOSIS — N179 Acute kidney failure, unspecified: Secondary | ICD-10-CM | POA: Diagnosis not present

## 2024-05-20 DIAGNOSIS — Z79899 Other long term (current) drug therapy: Secondary | ICD-10-CM | POA: Insufficient documentation

## 2024-05-20 DIAGNOSIS — N132 Hydronephrosis with renal and ureteral calculous obstruction: Secondary | ICD-10-CM | POA: Diagnosis not present

## 2024-05-20 DIAGNOSIS — R109 Unspecified abdominal pain: Secondary | ICD-10-CM | POA: Diagnosis present

## 2024-05-20 LAB — COMPREHENSIVE METABOLIC PANEL WITH GFR
ALT: 11 U/L (ref 0–44)
AST: 17 U/L (ref 15–41)
Albumin: 3.9 g/dL (ref 3.5–5.0)
Alkaline Phosphatase: 66 U/L (ref 38–126)
Anion gap: 9 (ref 5–15)
BUN: 19 mg/dL (ref 8–23)
CO2: 28 mmol/L (ref 22–32)
Calcium: 8.7 mg/dL — ABNORMAL LOW (ref 8.9–10.3)
Chloride: 99 mmol/L (ref 98–111)
Creatinine, Ser: 1.37 mg/dL — ABNORMAL HIGH (ref 0.44–1.00)
GFR, Estimated: 39 mL/min — ABNORMAL LOW (ref >60)
Glucose, Bld: 106 mg/dL — ABNORMAL HIGH (ref 70–99)
Potassium: 3.8 mmol/L (ref 3.5–5.1)
Sodium: 136 mmol/L (ref 135–145)
Total Bilirubin: 0.9 mg/dL (ref 0.0–1.2)
Total Protein: 6.8 g/dL (ref 6.5–8.1)

## 2024-05-20 LAB — URINALYSIS, ROUTINE W REFLEX MICROSCOPIC
Bilirubin Urine: NEGATIVE
Glucose, UA: NEGATIVE mg/dL
Ketones, ur: 5 mg/dL — AB
Nitrite: NEGATIVE
Protein, ur: 30 mg/dL — AB
RBC / HPF: >50 RBC/hpf (ref 0–5)
Specific Gravity, Urine: 1.013 (ref 1.005–1.030)
pH: 7 (ref 5.0–8.0)

## 2024-05-20 LAB — CBC WITH DIFFERENTIAL/PLATELET
Abs Immature Granulocytes: 0.05 10*3/uL (ref 0.00–0.07)
Basophils Absolute: 0 10*3/uL (ref 0.0–0.1)
Basophils Relative: 0 %
Eosinophils Absolute: 0.1 10*3/uL (ref 0.0–0.5)
Eosinophils Relative: 1 %
HCT: 36.8 % (ref 36.0–46.0)
Hemoglobin: 11.5 g/dL — ABNORMAL LOW (ref 12.0–15.0)
Immature Granulocytes: 1 %
Lymphocytes Relative: 8 %
Lymphs Abs: 0.6 10*3/uL — ABNORMAL LOW (ref 0.7–4.0)
MCH: 27.6 pg (ref 26.0–34.0)
MCHC: 31.3 g/dL (ref 30.0–36.0)
MCV: 88.5 fL (ref 80.0–100.0)
Monocytes Absolute: 1.1 10*3/uL — ABNORMAL HIGH (ref 0.1–1.0)
Monocytes Relative: 15 %
Neutro Abs: 5.8 10*3/uL (ref 1.7–7.7)
Neutrophils Relative %: 75 %
Platelets: 191 10*3/uL (ref 150–400)
RBC: 4.16 MIL/uL (ref 3.87–5.11)
RDW: 16.4 % — ABNORMAL HIGH (ref 11.5–15.5)
WBC: 7.7 10*3/uL (ref 4.0–10.5)
nRBC: 0 % (ref 0.0–0.2)

## 2024-05-20 MED ORDER — GOLYTELY 236 G PO SOLR: 4000.0000 mL | Freq: Once | ORAL | 0 refills | Status: AC

## 2024-05-20 MED ORDER — PROMETHAZINE HCL 25 MG RE SUPP: 25.0000 mg | Freq: Four times a day (QID) | RECTAL | 0 refills | Status: DC | PRN

## 2024-05-20 MED ORDER — SODIUM CHLORIDE 0.9 % IV SOLN
12.5000 mg | Freq: Once | INTRAVENOUS | Status: AC
  Administered 2024-05-20: 12.5 mg via INTRAVENOUS
  Filled 2024-05-20: qty 12.5

## 2024-05-20 MED ORDER — BISACODYL 10 MG RE SUPP
10.0000 mg | Freq: Once | RECTAL | Status: DC
  Filled 2024-05-20: qty 1

## 2024-05-20 MED ORDER — SODIUM CHLORIDE 0.9 % IV BOLUS
1000.0000 mL | Freq: Once | INTRAVENOUS | Status: AC
  Administered 2024-05-20: 1000 mL via INTRAVENOUS

## 2024-05-20 NOTE — Discharge Instructions (Addendum)
 You are constipated---this is likely causing some of your abdominal pain---please drink the golytely as instructed. If you do not have a BM in 3 days or stop passing gas, have intractable nausea and vomiting, please return to the ED. Additionally, your kidney function got a little worse likely 2/2 to the kidney stone. Make sure you follow-up with urology and drink lots of water.

## 2024-05-20 NOTE — ED Triage Notes (Signed)
 Pt has c/o no bowel movement for a week, has a known kidney stone that is still causing right side flank pain.

## 2024-05-20 NOTE — ED Provider Notes (Signed)
 [place] EMERGENCY DEPARTMENT AT [facility] Provider Note   CSN: [id number] Arrival date & time: 05/20/24  1352     History  Chief Complaint  Patient presents with   Flank Pain   Constipation    Jennifer Maddox is a 78 y.o. female, hx of DMII, HTN, who presents to the ED 2/2 to constipationx1 week w/associated abdominal pain. States she went to [facility] 3 days ago and was diagnosed with a kidney stone but that they didn't give her anything for constipation. States she feels bloated and swollen and vomited once today. Has tried prune juice, mag citrate, dulcolax and suppositories without any success. She did vomit this AM once. Has a history of cholecystectomy, hysterectomy. Saw urology yesterday and has plans for lithotripsy. Denies any fevers or chills.   Home Medications Prior to Admission medications   Medication Sig Start Date End Date Taking? Authorizing Provider  polyethylene glycol (GOLYTELY) 236 g solution Take 4,000 mLs by mouth once for 1 dose. 05/20/24 05/20/24 Yes Telesia Ates L, PA  promethazine  (PHENERGAN ) 25 MG suppository Place 1 suppository (25 mg total) rectally every 6 (six) hours as needed for nausea or vomiting. 05/20/24  Yes Domitila Stetler L, PA  acetaminophen  (TYLENOL ) 500 MG tablet Take by mouth.    [provider]  Alcohol Swabs (DROPSAFE ALCOHOL PREP) 70 % PADS APPLY TOPICALLY EVERY DAY 04/21/23   [provider]  amLODipine (NORVASC) 2.5 MG tablet Take 2.5 mg by mouth daily. 12/15/19   [provider]  anastrozole  (ARIMIDEX ) 1 MG tablet Take 1 tablet (1 mg total) by mouth daily. 06/26/23   Avonne Boettcher, MD  blood glucose meter kit and supplies USE TWICE DAILY 09/27/15   [provider]  Cetirizine HCl 10 MG CAPS Take by mouth.    [provider]  clobetasol ointment (TEMOVATE) 0.05 % Apply 1 application topically daily as needed (itching).  10/14/17   [provider]  cyanocobalamin  (VITAMIN B12)  1000 MCG tablet Take 1,000 mcg by mouth daily.    [provider]  Docusate Sodium  (DSS) 250 MG CAPS Take by mouth.    [provider]  furosemide  (LASIX ) 20 MG tablet Take 20 mg by mouth 2 (two) times daily.  04/22/15   [provider]  gabapentin  (NEURONTIN ) 300 MG capsule Take 300 mg by mouth at bedtime.  08/07/15   [provider]  glucose blood test strip  06/14/14   [provider]  hydrALAZINE (APRESOLINE) 10 MG tablet Take 10 mg by mouth 2 (two) times daily. 09/30/21   [provider]  HYDROcodone -acetaminophen  (NORCO/VICODIN) 5-325 MG tablet Take 2 tablets by mouth every 6 (six) hours as needed for moderate pain (pain score 4-6) or severe pain (pain score 7-10). 05/18/24   Lynnda Sas, MD  losartan -hydrochlorothiazide (HYZAAR) 100-12.5 MG tablet Take 1 tablet by mouth daily. 11/28/19 06/25/23  [provider]  losartan -hydrochlorothiazide (HYZAAR) 100-12.5 MG tablet Take 1 tablet by mouth daily.    [provider]  MELATONIN PO Take 12 mg by mouth at bedtime.     [provider]  Multiple Vitamins-Minerals (MULTIVITAMIN ADULT PO) Take 1 tablet by mouth daily.     [provider]  ondansetron  (ZOFRAN -ODT) 4 MG disintegrating tablet Allow 1-2 tablets to dissolve in your mouth every 8 hours as needed for nausea/vomiting 05/18/24   Lynnda Sas, MD  [facility], 0.25 OR 0.5 MG/DOSE, 2 MG/3ML [facility]  06/19/22   [provider]  pioglitazone (ACTOS) 15 MG tablet Take 15 mg by mouth daily. 08/01/21   [provider]  potassium chloride (KLOR-CON M10) 10 MEQ tablet Take 1 tablet by mouth daily. 03/16/22   [provider]  potassium chloride (KLOR-CON) 10 MEQ tablet Take 1 tablet by mouth daily at 12 noon. Patient not taking: Reported on 06/25/2023 04/01/21 04/01/22  [provider]  rosuvastatin (CRESTOR) 40 MG tablet Take 40 mg by mouth daily.    [provider]  tamsulosin  (FLOMAX )  0.4 MG CAPS capsule Take 1 tablet by mouth daily until you pass the kidney stone or no longer have symptoms 05/18/24   Lynnda Sas, MD  traZODone  (DESYREL ) 100 MG tablet Take 150 mg by mouth at bedtime. 08/11/22   [provider]  TRUEplus Lancets 28G MISC TEST BLOOD SUGAR AS DIRECTED 09/14/22   [provider]      Allergies    Atorvastatin and Naproxen sodium    Review of Systems   Review of Systems  Constitutional:  Negative for fever.  Gastrointestinal:  Positive for constipation.  Genitourinary:  Positive for flank pain.    Physical Exam Updated Vital Signs BP (!) 160/76 (BP Location: Right Arm)   Pulse (!) 105   Temp 98.9 F (37.2 C) (Oral)   Resp 20   Ht 5\' 5"  (1.651 m)   Wt 95.3 kg   SpO2 91%   BMI 34.95 kg/m  Physical Exam Vitals and nursing note reviewed.  Constitutional:      General: She is not in acute distress.    Appearance: She is well-developed.  HENT:     Head: Normocephalic and atraumatic.  Eyes:     Conjunctiva/sclera: Conjunctivae normal.  Cardiovascular:     Rate and Rhythm: Normal rate and regular rhythm.     Heart sounds: No murmur heard. Pulmonary:     Effort: Pulmonary effort is normal. No respiratory distress.     Breath sounds: Normal breath sounds.  Abdominal:     Palpations: Abdomen is soft.     Tenderness: There is abdominal tenderness in the right upper quadrant and right lower quadrant. There is no guarding or rebound.  Musculoskeletal:        General: No swelling.     Cervical back: Neck supple.  Skin:    General: Skin is warm and dry.     Capillary Refill: Capillary refill takes less than 2 seconds.  Neurological:     Mental Status: She is alert.  Psychiatric:        Mood and Affect: Mood normal.    ED Results / Procedures / Treatments   Labs (all labs ordered are listed, but only abnormal results are displayed) Labs Reviewed  CBC WITH DIFFERENTIAL/PLATELET - Abnormal; Notable for the following components:       Result Value   Hemoglobin 11.5 (*)    RDW 16.4 (*)    Lymphs Abs 0.6 (*)    Monocytes Absolute 1.1 (*)    All other components within normal limits  COMPREHENSIVE METABOLIC PANEL WITH GFR - Abnormal; Notable for the following components:   Glucose, Bld 106 (*)    Creatinine, Ser 1.37 (*)    Calcium 8.7 (*)    GFR, Estimated 39 (*)    All other components within normal limits  URINALYSIS, ROUTINE W REFLEX MICROSCOPIC - Abnormal; Notable for the following components:   APPearance HAZY (*)    Hgb urine dipstick LARGE (*)    Ketones, ur 5 (*)  Protein, ur 30 (*)    Leukocytes,Ua LARGE (*)    Bacteria, UA RARE (*)    All other components within normal limits    EKG None  Radiology DG Abdomen 1 View Result Date: 05/20/2024 CLINICAL DATA:  No BM for 1 week.  Abdominal pain, vomiting. EXAM: ABDOMEN - 1 VIEW COMPARISON:  05/19/2024 FINDINGS: Moderate stool burden throughout the colon. Mild gaseous distension of the colon. No bowel obstruction, free air or organomegaly. Prior cholecystectomy. Visualized lung bases clear. IMPRESSION: Moderate stool burden throughout the colon. Mild gaseous distention of the colon. Electronically Signed   By: Janeece Mechanic M.D.   On: 05/20/2024 18:17    Procedures Procedures    Medications Ordered in ED Medications  bisacodyl (DULCOLAX) suppository 10 mg (10 mg Rectal Patient Refused/Not Given 05/20/24 1950)  promethazine  (PHENERGAN ) 12.5 mg in sodium chloride  0.9 % 50 mL IVPB (0 mg Intravenous Stopped 05/20/24 2049)  sodium chloride  0.9 % bolus 1,000 mL (0 mLs Intravenous Stopped 05/20/24 2146)    ED Course/ Medical Decision Making/ A&P                                 Medical Decision Making Patient here for constipation x1 week, and abdominal pain. Had CT scan 3 days prior and found to have right ureteral stone, saw urology and was told will likely need lithotripsy. Urine grew multiple bacteria--thus urology decided to not treat. Given CT scan 3  days ago, only one episode of vomitus, will obtain KUB to eval for obstruction. Labs to evaluate renal function.   Amount and/or Complexity of Data Reviewed Labs: ordered.    Details: Elevated Cr of 1.37 (previous 0.96), rare bacteria in urine Radiology: ordered.    Details: KUB shows moderate stool Discussion of management or test interpretation with external provider(s): Patient had Ishanvi Mcquitty Bm after enema. KUB shows no obstruction. Offered suppository--which patient refused. She does not appear to be obstructed--just constipated--will send home with Golytely. Additionally, AKI on top of ureteral obstruction/stone, I spoke with urology, Dr. Jarvis Mesa, who states have close f/u with [facility] urology (pt is already established) and hydrate. No need for admission at this time. Discussed with patient and son who is in agreement with plan.   Risk OTC drugs. Prescription drug management.   Final Clinical Impression(s) / ED Diagnoses Final diagnoses:  Constipation, unspecified constipation type  AKI (acute kidney injury) (HCC)  Ureteral stone with hydronephrosis    Rx / DC Orders ED Discharge Orders          Ordered    polyethylene glycol (GOLYTELY) 236 g solution   Once        05/20/24 2140    promethazine  (PHENERGAN ) 25 MG suppository  Every 6 hours PRN        05/20/24 2142              Varina Hulon L, PA 05/20/24 2344    Flonnie Humphrey, DO 05/21/24 1505

## 2024-05-22 ENCOUNTER — Telehealth: Payer: Self-pay

## 2024-05-22 DIAGNOSIS — N201 Calculus of ureter: Secondary | ICD-10-CM

## 2024-05-22 DIAGNOSIS — N23 Unspecified renal colic: Secondary | ICD-10-CM

## 2024-05-22 NOTE — Telephone Encounter (Signed)
 Stone not well-visualized on regular x-ray and would recommend ureteroscopy.  Will send orders to [facility] who will be calling to schedule

## 2024-05-22 NOTE — Telephone Encounter (Signed)
 Notified patient as instructed, patient pleased. Will wait for the call from [facility]

## 2024-05-22 NOTE — Telephone Encounter (Signed)
 Pt LM on triage line requesting results of her x-ray and next steps. Please advise.

## 2024-05-23 ENCOUNTER — Other Ambulatory Visit: Payer: Self-pay

## 2024-05-23 DIAGNOSIS — N201 Calculus of ureter: Secondary | ICD-10-CM

## 2024-05-23 NOTE — Progress Notes (Unsigned)
 Surgical Physician Order Form [facility] [place]  * Scheduling expectation : ASAP-on Ozempic with last injection 05/20/2023  *Length of Case: 45 minutes  *Clearance needed: no  *Anticoagulation Instructions: N/A  *Aspirin Instructions: N/A  *Post-op visit Date/Instructions:  1 week cysto stent removal  *Diagnosis: Right Ureteral Stone  *Procedure: right Ureteroscopy w/laser lithotripsy & stent placement (16109)   Additional orders: N/A  -Admit type: OUTpatient  -Anesthesia: Choice  -VTE Prophylaxis Standing Order SCD's       Other:   -Standing Lab Orders Per Anesthesia    Lab other: None  -Standing Test orders EKG/Chest x-ray per Anesthesia       Test other:   - Medications:  Ceftriaxone(Rocephin) 1gm IV  -Other orders:  N/A

## 2024-05-23 NOTE — Addendum Note (Signed)
 Addended by: [name] on: 05/23/2024 07:46 AM   Modules accepted: Orders

## 2024-05-24 ENCOUNTER — Telehealth: Payer: Self-pay

## 2024-05-24 NOTE — Telephone Encounter (Signed)
 Per Dr. Cherylene Corrente, Patient is to be scheduled for Right Ureteroscopy with Laser Lithotripsy and Stent Placement   Jennifer Maddox was contacted and possible surgical dates were discussed, Tuesday June 24th, 2025 was agreed upon for surgery.    Patient was directed to call [phone] between 1-3pm the day before surgery to find out surgical arrival time.  Instructions were given not to eat or drink from midnight on the night before surgery and have a driver for the day of surgery. On the surgery day patient was instructed to enter through the Medical Mall entrance of [facility] report the Same Day Surgery desk.   Pre-Admit Testing will be in contact via phone to set up an interview with the anesthesia team to review your history and medications prior to surgery.   Reminder of this information was sent via Mail to the patient.

## 2024-05-24 NOTE — Progress Notes (Signed)
   [facility] Surgical Posting Form  Surgery Date: Date: 06/06/2024  Surgeon: Dr. Darlynn Elam, MD  Inpt ( No  )   Outpt (Yes)   Obs ( No  )   Diagnosis: N20.1 Right Ureteral Stone  -CPT: [phone]  Surgery: Right Ureteroscopy with Laser Lithotripsy and Stent Placement  Stop Anticoagulations: No  Cardiac/Medical/Pulmonary Clearance needed: no  Patient is aware to hold Ozempic for at least 1 week prior to surgery  *Orders entered into EPIC  Date: 05/24/24   *Case booked in EPIC  Date: 05/24/24  *Notified pt of Surgery: Date: 05/24/24  PRE-OP UA & CX: no  *Placed into Prior Authorization Work [place] Date: 05/24/24  Assistant/laser/rep:No

## 2024-05-31 ENCOUNTER — Other Ambulatory Visit: Payer: Medicare (Managed Care)

## 2024-06-02 ENCOUNTER — Encounter
Admission: RE | Admit: 2024-06-02 | Discharge: 2024-06-02 | Disposition: A | Discharge status: Home / Self Care | Payer: Medicare (Managed Care) | Source: Ambulatory Visit | Attending: Urology | Admitting: Urology

## 2024-06-02 ENCOUNTER — Other Ambulatory Visit: Payer: Self-pay

## 2024-06-02 DIAGNOSIS — I358 Other nonrheumatic aortic valve disorders: Secondary | ICD-10-CM

## 2024-06-02 DIAGNOSIS — I350 Nonrheumatic aortic (valve) stenosis: Secondary | ICD-10-CM

## 2024-06-02 DIAGNOSIS — E119 Type 2 diabetes mellitus without complications: Secondary | ICD-10-CM

## 2024-06-02 DIAGNOSIS — I272 Pulmonary hypertension, unspecified: Secondary | ICD-10-CM

## 2024-06-02 DIAGNOSIS — Z01818 Encounter for other preprocedural examination: Secondary | ICD-10-CM

## 2024-06-02 DIAGNOSIS — I10 Essential (primary) hypertension: Secondary | ICD-10-CM

## 2024-06-02 DIAGNOSIS — Z01810 Encounter for preprocedural cardiovascular examination: Secondary | ICD-10-CM

## 2024-06-02 HISTORY — DX: Obstructive sleep apnea (adult) (pediatric): G47.33

## 2024-06-02 HISTORY — DX: Fatty (change of) liver, not elsewhere classified: K76.0

## 2024-06-02 HISTORY — DX: Irritable bowel syndrome, unspecified: K58.9

## 2024-06-02 HISTORY — DX: Disorder of adrenal gland, unspecified: E27.9

## 2024-06-02 HISTORY — DX: Type 2 diabetes mellitus without complications: E11.9

## 2024-06-02 HISTORY — DX: Obesity, unspecified: E66.9

## 2024-06-02 HISTORY — DX: Dyspnea, unspecified: R06.00

## 2024-06-02 HISTORY — DX: Pulmonary hypertension, unspecified: I27.20

## 2024-06-02 HISTORY — DX: Personal history of urinary calculi: Z87.442

## 2024-06-02 HISTORY — DX: Nonrheumatic aortic (valve) stenosis: I35.0

## 2024-06-02 NOTE — Patient Instructions (Signed)
 Your procedure is scheduled on:06-06-24 Tuesday Report to the Registration Desk on the 1st floor of the Medical Mall.Then proceed to the 2nd floor Surgery Desk To find out your arrival time, please call [phone] between 1PM - 3PM on:06-05-24 Monday If your arrival time is 6:00 am, do not arrive before that time as the Medical Mall entrance doors do not open until 6:00 am.  REMEMBER: Instructions that are not followed completely may result in serious medical risk, up to and including death; or upon the discretion of your surgeon and anesthesiologist your surgery may need to be rescheduled.  Do not eat food OR drink any liquids after midnight the night before surgery.  No gum chewing or hard candies.  One week prior to surgery:Stop NOW 06-02-24 Stop Anti-inflammatories (NSAIDS) such as Advil , Aleve, Ibuprofen , Motrin , Naproxen, Naprosyn and Aspirin based products such as Excedrin, Goody's Powder, BC Powder. Stop ANY OVER THE COUNTER supplements until after surgery (Vitamin B 12, Melatonin, Multivitamin)  You may however, continue to take Tylenol  if needed for pain up until the day of surgery.  Stop Ozempic 7 days prior to surgery-Do NOT take again until AFTER Surgery  Last dose of Aspirin was on 06-01-24   Continue taking all of your other prescription medications up until the day of surgery.  Do NOT take any medication the day of surgery  No Alcohol for 24 hours before or after surgery.  No Smoking including e-cigarettes for 24 hours before surgery.  No chewable tobacco products for at least 6 hours before surgery.  No nicotine patches on the day of surgery.  Do not use any recreational drugs for at least a week (preferably 2 weeks) before your surgery.  Please be advised that the combination of cocaine and anesthesia may have negative outcomes, up to and including death. If you test positive for cocaine, your surgery will be cancelled.  On the morning of surgery brush your  teeth with toothpaste and water, you may rinse your mouth with mouthwash if you wish. Do not swallow any toothpaste or mouthwash.  Do not wear jewelry, make-up, hairpins, clips or nail polish.  For welded (permanent) jewelry: bracelets, anklets, waist bands, etc.  Please have this removed prior to surgery.  If it is not removed, there is a chance that hospital personnel will need to cut it off on the day of surgery.  Do not wear lotions, powders, or perfumes.   Do not shave body hair from the neck down 48 hours before surgery.  Contact lenses, hearing aids and dentures may not be worn into surgery.  Do not bring valuables to the hospital. [facility] is not responsible for any missing/lost belongings or valuables.   Bring your C-PAP to the hospital   Notify your doctor if there is any change in your medical condition (cold, fever, infection).  Wear comfortable clothing (specific to your surgery type) to the hospital.  After surgery, you can help prevent lung complications by doing breathing exercises.  Take deep breaths and cough every 1-2 hours. Your doctor may order a device called an Incentive Spirometer to help you take deep breaths. When coughing or sneezing, hold a pillow firmly against your incision with both hands. This is called "splinting." Doing this helps protect your incision. It also decreases belly discomfort.  If you are being admitted to the hospital overnight, leave your suitcase in the car. After surgery it may be brought to your room.  In case of increased patient census, it  may be necessary for you, the patient, to continue your postoperative care in the Same Day Surgery department.  If you are being discharged the day of surgery, you will not be allowed to drive home. You will need a responsible individual to drive you home and stay with you for 24 hours after surgery.   If you are taking public transportation, you will need to have a responsible individual with  you.  Please call the Pre-admissions Testing Dept. at [phone] if you have any questions about these instructions.  Surgery Visitation Policy:  Patients having surgery or a procedure may have two visitors.  Children under the age of 41 must have an adult with them who is not the patient.

## 2024-06-05 ENCOUNTER — Encounter: Payer: Self-pay | Admitting: Urology

## 2024-06-05 ENCOUNTER — Inpatient Hospital Stay: Admission: RE | Admit: 2024-06-05 | Payer: Medicare (Managed Care) | Source: Ambulatory Visit

## 2024-06-05 ENCOUNTER — Encounter
Admission: RE | Admit: 2024-06-05 | Discharge: 2024-06-05 | Disposition: A | Discharge status: Home / Self Care | Payer: Medicare (Managed Care) | Source: Ambulatory Visit | Attending: Urology | Admitting: Urology

## 2024-06-05 DIAGNOSIS — Z01810 Encounter for preprocedural cardiovascular examination: Secondary | ICD-10-CM | POA: Diagnosis not present

## 2024-06-05 DIAGNOSIS — I10 Essential (primary) hypertension: Secondary | ICD-10-CM | POA: Diagnosis not present

## 2024-06-05 DIAGNOSIS — I272 Pulmonary hypertension, unspecified: Secondary | ICD-10-CM | POA: Insufficient documentation

## 2024-06-05 DIAGNOSIS — R9431 Abnormal electrocardiogram [ECG] [EKG]: Secondary | ICD-10-CM | POA: Insufficient documentation

## 2024-06-05 DIAGNOSIS — E119 Type 2 diabetes mellitus without complications: Secondary | ICD-10-CM | POA: Diagnosis not present

## 2024-06-05 DIAGNOSIS — Z01818 Encounter for other preprocedural examination: Secondary | ICD-10-CM | POA: Diagnosis present

## 2024-06-05 DIAGNOSIS — I350 Nonrheumatic aortic (valve) stenosis: Secondary | ICD-10-CM | POA: Insufficient documentation

## 2024-06-05 NOTE — Progress Notes (Signed)
 Perioperative / Anesthesia Services  Pre-Admission Testing Clinical Review / Pre-Operative Anesthesia Consult  Date: 06/05/24  PATIENT DEMOGRAPHICS: Name: Jennifer Maddox DOB: [birth year] MRN:   [id number]  Note: Available PAT nursing documentation and vital signs have been reviewed. Clinical nursing staff has updated patient's PMH/PSHx, current medication list, and drug allergies/intolerances to ensure complete and comprehensive history available to assist care teams in MDM as it pertains to the aforementioned surgical procedure and anticipated anesthetic course. Extensive review of available clinical information personally performed. [place] PMH and PSHx updated with any diagnoses/procedures that  may have been inadvertently omitted during his intake with the pre-admission testing department's nursing staff.  PLANNED SURGICAL PROCEDURE(S):   Case: [id number] Date/Time: 06/06/24 0909   Procedure: CYSTOSCOPY/URETEROSCOPY/HOLMIUM LASER/STENT PLACEMENT (Right)   Anesthesia type: General   Diagnosis: Right ureteral stone [N20.1]   Pre-op diagnosis: Right Ureteral Stone   Location: [facility] OR ROOM 10 / [facility]S FOR AN[facility]   Surgeons: Twylla Glendia BROCKS, MD        CLINICAL DISCUSSION: Jennifer Maddox is a 78 y.o. female who is submitted for pre-surgical anesthesia review and clearance prior to her undergoing the above procedure. Patient is a Former Smoker (quit 12/1977). Pertinent PMH includes: aortic stenosis, pulmonary hypertension, TIA, aortic atherosclerosis, HTN, HLD, T2DM, DOE, OSAH (on nocturnal PAP therapy), anemia, remote LEFT breast cancer), nephrolithiasis, OA, thoracolumbar DDD, insomnia.  Patient is followed by cardiology Philippe, MD). She was last seen in the cardiology clinic on 12/03/2023; notes reviewed. At the time of her clinic visit, patient doing well overall from a cardiovascular perspective.  Patient with chronic exertional dyspnea that was reportedly stable and at  baseline.  Additionally, patient with chronic peripheral edema that was overall stable.  Patient denied any chest pain, PND, orthopnea, palpitations, weakness, fatigue, vertiginous symptoms, or presyncope/syncope. Patient with a past medical history significant for cardiovascular diagnoses. Documented physical exam was grossly benign, providing no evidence of acute exacerbation and/or decompensation of the patient's known cardiovascular conditions.  Most recent myocardial perfusion imaging study was performed on 11/21/2021 revealing a normal left ventricular systolic function with a hyperdynamic EF of 68%.  There were no regional wall motion abnormalities.  No artifact or left ventricular cavity size enlargement appreciated on review of imaging. SPECT images demonstrated no evidence of stress-induced myocardial ischemia or arrhythmia; no scintigraphic evidence of scar.  TID ratio = 0.87. Study determined to be normal and low risk.  Most recent TTE performed on 11/16/2023 revealed a normal left ventricular systolic function with an EF of >55% %. There was mild LVH.  There were no regional wall motion abnormalities. Left ventricular diastolic Doppler parameters consistent with abnormal relaxation (G1DD). Right ventricular size and function normal with a TAPSE measuring 2.5 cm  (normal range >/= 1.6 cm).  RVSP = 39 mmHg. Mild mitral annular calcification observed there was mild mitral, pulmonary, and tricuspid valve regurgitation.  Left atrium mildly dilated.  Aortic valve mildly stenotic with a mean transvalvular pressure gradient of 14 mmHg; AVA (VTI) 1.3 cm.  All remaining transvalvular gradients were noted to be normal providing no evidence of hemodynamically significant valvular stenosis. Aorta normal in size with no evidence of ectasia or aneurysmal dilatation.  Blood pressure well controlled at 124/80 mmHg on currently prescribed diuretic (furosemide ) monotherapy. Patient is on rosuvastatin for her HLD  diagnosis and ASCVD prevention.  T2DM well-controlled on currently prescribed regimen.  Last hemoglobin A1c at the time of her cardiology visit was 6.8% when checked on  08/11/2023.  Of note, since patient was last seen by cardiology, her hemoglobin A1c has been rechecked with continued improvement to 6.1% on 02/10/2024.  Patient does have an OSAH diagnosis and is reportedly compliant with her prescribed nocturnal PAP therapy.  Due to a fall resulting and rotator cuff injury, patient was less active and not walking or going to the pool as she normally did.  With that said, patient able to complete all of her ADLs/IADLs without significant cardiovascular limitation.  Per the [facility], patient felt to be able to achieve at least 4 METS of physical activity without experiencing any significant degree of angina/anginal equivalent symptoms. No changes were made to her medication regimen during her visit with cardiology.  Patient scheduled to follow-up with outpatient cardiology in 6 months or sooner if needed.  Jennifer Maddox is scheduled for an elective CYSTOSCOPY/URETEROSCOPY/HOLMIUM LASER/STENT PLACEMENT (Right) on 06/06/2024 with Dr. Glendia Barba, MD. Given patient's past medical history significant for cardiovascular diagnoses, presurgical cardiac clearance was sought by the PAT team. Per cardiology, this patient is optimized for surgery and may proceed with the planned procedural course with a LOW risk of significant perioperative cardiovascular complications.  In review of the patient's chart, it is noted that she is on daily oral antithrombotic therapy. She has been instructed on recommendations for holding her daily low-dose ASA for 5 days prior to her procedure with plans to restart as soon as postoperative bleeding risk felt to be minimized by his primary attending surgeon. The patient has been instructed that her last dose of should be on 05/31/2024.  Patient denies previous perioperative complications  with anesthesia in the past. In review her EMR, it is noted that patient underwent a general anesthetic course here at [facility] (ASA III) in 07/2022 without documented complications.   MOST RECENT VITAL SIGNS:    05/20/2024    9:28 PM 05/20/2024    6:07 PM 05/20/2024    1:59 PM  Vitals with BMI  Height   5' 5  Weight   210 lbs  BMI   34.95  Systolic 160 172 844  Diastolic 76 70 71  Pulse 105 101 86   PROVIDERS/SPECIALISTS: NOTE: Primary physician provider listed below. Patient may have been seen by APP or partner within same practice.   PROVIDER ROLE / SPECIALTY LAST SHERLEAN Barba Glendia JAYSON, MD Urology (Surgeon) 05/19/2024  Valora Agent, MD Primary Care Provider 04/03/2024  Florencio Shine, MD Cardiology 12/03/2023  Cherilyn Ned, MD Endocrinology 02/10/2024   ALLERGIES: Allergies  Allergen Reactions   Atorvastatin Other (See Comments)    Cramps   Naproxen Sodium Other (See Comments)    Head and Neck Puritis    CURRENT HOME MEDICATIONS: No current facility-administered medications for this encounter.    aspirin EC 81 MG tablet   cyanocobalamin  (VITAMIN B12) 1000 MCG tablet   docusate sodium  (COLACE) 100 MG capsule   furosemide  (LASIX ) 20 MG tablet   gabapentin  (NEURONTIN ) 300 MG capsule   MELATONIN PO   Methylcellulose, Laxative, (CITRUCEL PO)   Multiple Vitamins-Minerals (MULTIVITAMIN ADULT PO)   OZEMPIC, 2 MG/DOSE, 8 MG/3ML SOPN   pioglitazone (ACTOS) 30 MG tablet   potassium chloride (KLOR-CON M10) 10 MEQ tablet   rosuvastatin (CRESTOR) 40 MG tablet   traZODone  (DESYREL ) 50 MG tablet   blood glucose meter kit and supplies   glucose blood test strip   TRUEplus Lancets 28G MISC   HISTORY: Past Medical History:  Diagnosis Date  Adenoma of left adrenal gland    Anemia    Aortic atherosclerosis (HCC)    Aortic valve stenosis    Arthritis    Bilateral renal cysts    Breast cancer, left (HCC)    Colon polyps    COPD  (chronic obstructive pulmonary disease) (HCC)    DDD (degenerative disc disease), thoracolumbar    Degenerative disc disease, lumbar    Diverticulosis    DM (diabetes mellitus), type 2 (HCC)    Dyspnea    History of kidney stones    Hyperlipidemia    Hypertension    Insomnia    a.) uses melatonin + trazodone  PRN   Irritable bowel syndrome with both constipation and diarrhea    Long-term use of aspirin therapy    NAFLD (nonalcoholic fatty liver disease)    Obesity    OSA on CPAP    Peripheral edema    Personal history of radiation therapy    Pulmonary hypertension (HCC)    Right inguinal hernia    TIA (transient ischemic attack) 06/2007   Past Surgical History:  Procedure Laterality Date   ABDOMINAL HYSTERECTOMY     around age 49 ; ovaries intact   BREAST BIOPSY Left 12/01/2018   Pathology LEFT breast mass 11 o'clock location: Invasive mammary   BREAST EXCISIONAL BIOPSY Left 2011   breast ca with radation   CHOLECYSTECTOMY     COLONOSCOPY W/ POLYPECTOMY     COLONOSCOPY WITH PROPOFOL  N/A 10/29/2017   Procedure: COLONOSCOPY WITH PROPOFOL ;  Surgeon: Viktoria Lamar DASEN, MD;  Location: [facility] ENDOSCOPY;  Service: Endoscopy;  Laterality: N/A;   COLONOSCOPY WITH PROPOFOL  N/A 07/24/2022   Procedure: COLONOSCOPY WITH PROPOFOL ;  Surgeon: Maryruth Ole DASEN, MD;  Location: [facility] ENDOSCOPY;  Service: Endoscopy;  Laterality: N/A;   ESOPHAGOGASTRODUODENOSCOPY (EGD) WITH PROPOFOL  N/A 10/29/2017   Procedure: ESOPHAGOGASTRODUODENOSCOPY (EGD) WITH PROPOFOL ;  Surgeon: Viktoria Lamar DASEN, MD;  Location: [facility] ENDOSCOPY;  Service: Endoscopy;  Laterality: N/A;   KNEE ARTHROSCOPY W/ MENISCAL REPAIR Right    ROTATOR CUFF REPAIR Right 12/2023   SENTINEL NODE BIOPSY Left 01/17/2019   Procedure: SENTINEL NODE INJECTION;  Surgeon: Tye Millet, DO;  Location: [facility] ORS;  Service: General;  Laterality: Left;   TONSILLECTOMY     TOTAL MASTECTOMY Left 01/17/2019   Procedure: TOTAL MASTECTOMY;  Surgeon: Tye Millet, DO;  Location: [facility] ORS;  Service: General;  Laterality: Left;   Family History  Problem Relation Age of Onset   Lung cancer Mother        smoker; deceased 70   Heart attack Father        deceased 31   Melanoma Brother 89       currently 11   Prostate cancer Brother 53       currently 21   Melanoma Son 45       below left armpit; currently 17   Breast cancer Neg Hx    Social History   Tobacco Use   Smoking status: Former    Current packs/day: 0.00    Types: Cigarettes    Quit date: 12/14/1977    Years since quitting: 46.5   Smokeless tobacco: Never  Substance Use Topics   Alcohol use: No   LABS:  Lab Results  Component Value Date   WBC 7.7 05/20/2024   HGB 11.5 (L) 05/20/2024   HCT 36.8 05/20/2024   MCV 88.5 05/20/2024   PLT 191 05/20/2024   Lab Results  Component Value Date   NA 136  05/20/2024   CL 99 05/20/2024   K 3.8 05/20/2024   CO2 28 05/20/2024   BUN 19 05/20/2024   CREATININE 1.37 (H) 05/20/2024   GFRNONAA 39 (L) 05/20/2024   CALCIUM 8.7 (L) 05/20/2024   ALBUMIN 3.9 05/20/2024   GLUCOSE 106 (H) 05/20/2024    ECG: Date: 06/05/2024  Time ECG obtained: 1325 PM Rate: 65 bpm Rhythm: normal sinus Axis (leads I and aVF): normal Intervals: PR 166 ms. QRS 114 ms. QTc 434 ms. ST segment and T wave changes: Non-specific T wave abnormality (inversion) in lead III. Evidence of a possible, age undetermined, prior infarct:  No Comparison: Similar to previous tracing obtained on 10/20/2021   IMAGING / PROCEDURES: ABDOMEN 1 VIEW (KUB) performed on 05/19/2024 Known right ureteral calculus on prior exam is difficult to delineate from adjacent pelvic phleboliths. Retained contrast within dilated right ureter from yesterday's CT.  CT ABDOMEN PELVIS W CONTRAST performed on 05/18/2024 6 mm mid right ureteral calculus at the level of the sacral ureter. Associated moderate right hydroureteronephrosis. Stable 16 mm left adrenal nodule (image 21), unchanged,  favoring a benign adrenal adenoma. Small bilateral renal cysts, measuring up to 13 mm in the right lower kidney (image 41), measuring simple fluid density, benign (Bosniak I). Left renal sinus cysts. No follow-up is recommended. Atherosclerotic calcifications of the abdominal aorta and branch vessels. Small fat containing right inguinal hernia.  Degenerative changes of the visualized thoracolumbar spine.  MYOCARDIAL PERFUSION IMAGING STUDY (LEXISCAN ) performed on 11/22/2023 Normal left ventricular systolic function with a hyperdynamic LVEF of 74% No regional wall motion abnormalities Mild apical thinning No evidence of stress-induced myocardial ischemia or arrhythmia; no scintigraphic evidence of scar. No ST deviation noted Low risk study.  IMPRESSION AND PLAN: Jennifer Maddox has been referred for pre-anesthesia review and clearance prior to her undergoing the planned anesthetic and procedural courses. Available labs, pertinent testing, and imaging results were personally reviewed by me in preparation for upcoming operative/procedural course. [facility] medical record has been updated following extensive record review and patient interview with PAT staff.   This patient has been appropriately cleared by cardiology with an overall LOW risk of patient experiencing significant perioperative cardiovascular complications. Based on clinical review performed today (06/05/24), barring any significant acute changes in the patient's overall condition, it is anticipated that she will be able to proceed with the planned surgical intervention. Any acute changes in clinical condition may necessitate her procedure being postponed and/or cancelled. Patient will meet with anesthesia team (MD and/or CRNA) on the day of her procedure for preoperative evaluation/assessment. Questions regarding anesthetic course will be fielded at that time.   Pre-surgical instructions were reviewed with the patient during his PAT  appointment, and questions were fielded to satisfaction by PAT clinical staff. She has been instructed on which medications that she will need to hold prior to surgery, as well as the ones that have been deemed safe/appropriate to take on the day of her procedure. As part of the general education provided by PAT, patient made aware both verbally and in writing, that she would need to abstain from the use of any illegal substances during her perioperative course. She was advised that failure to follow the provided instructions could necessitate case cancellation or result in serious perioperative complications up to and including death. Patient encouraged to contact PAT and/or her surgeon's office to discuss any questions or concerns that may arise prior to surgery; verbalized understanding.   Dorise Pereyra, MSN, APRN, FNP-C, CEN [facility]  Perioperative Services Nurse Practitioner Phone: [phone] Fax: [phone] 06/05/24 1:41 PM  NOTE: This note has been prepared using Dragon dictation software. Despite my best ability to proofread, there is always the potential that unintentional transcriptional errors may still occur from this process.

## 2024-06-06 ENCOUNTER — Encounter
Admission: RE | Disposition: A | Discharge status: Home / Self Care | Payer: Self-pay | Source: Home / Self Care | Attending: Urology

## 2024-06-06 ENCOUNTER — Other Ambulatory Visit: Payer: Self-pay

## 2024-06-06 ENCOUNTER — Ambulatory Visit
Admission: RE | Admit: 2024-06-06 | Discharge: 2024-06-06 | Disposition: A | Discharge status: Home / Self Care | Payer: Medicare (Managed Care) | Attending: Urology | Admitting: Urology

## 2024-06-06 ENCOUNTER — Ambulatory Visit: Payer: Self-pay | Admitting: Urgent Care

## 2024-06-06 ENCOUNTER — Ambulatory Visit: Payer: Medicare (Managed Care) | Admitting: Urgent Care

## 2024-06-06 ENCOUNTER — Ambulatory Visit: Payer: Medicare (Managed Care)

## 2024-06-06 ENCOUNTER — Encounter: Payer: Self-pay | Admitting: Urology

## 2024-06-06 DIAGNOSIS — G4733 Obstructive sleep apnea (adult) (pediatric): Secondary | ICD-10-CM | POA: Insufficient documentation

## 2024-06-06 DIAGNOSIS — Z7985 Long-term (current) use of injectable non-insulin antidiabetic drugs: Secondary | ICD-10-CM | POA: Insufficient documentation

## 2024-06-06 DIAGNOSIS — I10 Essential (primary) hypertension: Secondary | ICD-10-CM | POA: Diagnosis not present

## 2024-06-06 DIAGNOSIS — I272 Pulmonary hypertension, unspecified: Secondary | ICD-10-CM | POA: Insufficient documentation

## 2024-06-06 DIAGNOSIS — Z87891 Personal history of nicotine dependence: Secondary | ICD-10-CM | POA: Insufficient documentation

## 2024-06-06 DIAGNOSIS — C661 Malignant neoplasm of right ureter: Secondary | ICD-10-CM

## 2024-06-06 DIAGNOSIS — I700 Atherosclerosis of aorta: Secondary | ICD-10-CM | POA: Diagnosis not present

## 2024-06-06 DIAGNOSIS — Z7984 Long term (current) use of oral hypoglycemic drugs: Secondary | ICD-10-CM | POA: Diagnosis not present

## 2024-06-06 DIAGNOSIS — K760 Fatty (change of) liver, not elsewhere classified: Secondary | ICD-10-CM | POA: Insufficient documentation

## 2024-06-06 DIAGNOSIS — M5134 Other intervertebral disc degeneration, thoracic region: Secondary | ICD-10-CM | POA: Insufficient documentation

## 2024-06-06 DIAGNOSIS — Z8673 Personal history of transient ischemic attack (TIA), and cerebral infarction without residual deficits: Secondary | ICD-10-CM | POA: Diagnosis not present

## 2024-06-06 DIAGNOSIS — E785 Hyperlipidemia, unspecified: Secondary | ICD-10-CM | POA: Insufficient documentation

## 2024-06-06 DIAGNOSIS — N201 Calculus of ureter: Secondary | ICD-10-CM | POA: Diagnosis present

## 2024-06-06 DIAGNOSIS — E119 Type 2 diabetes mellitus without complications: Secondary | ICD-10-CM | POA: Diagnosis not present

## 2024-06-06 DIAGNOSIS — Z8249 Family history of ischemic heart disease and other diseases of the circulatory system: Secondary | ICD-10-CM | POA: Insufficient documentation

## 2024-06-06 DIAGNOSIS — R011 Cardiac murmur, unspecified: Secondary | ICD-10-CM | POA: Diagnosis not present

## 2024-06-06 DIAGNOSIS — N132 Hydronephrosis with renal and ureteral calculous obstruction: Secondary | ICD-10-CM | POA: Diagnosis not present

## 2024-06-06 DIAGNOSIS — J449 Chronic obstructive pulmonary disease, unspecified: Secondary | ICD-10-CM | POA: Insufficient documentation

## 2024-06-06 DIAGNOSIS — Z01818 Encounter for other preprocedural examination: Secondary | ICD-10-CM

## 2024-06-06 HISTORY — DX: Unilateral inguinal hernia, without obstruction or gangrene, not specified as recurrent: K40.90

## 2024-06-06 HISTORY — DX: Polyp of colon: K63.5

## 2024-06-06 HISTORY — DX: Long term (current) use of aspirin: Z79.82

## 2024-06-06 HISTORY — DX: Malignant neoplasm of unspecified site of left female breast: C50.912

## 2024-06-06 HISTORY — DX: Benign neoplasm of left adrenal gland: D35.02

## 2024-06-06 HISTORY — DX: Insomnia, unspecified: G47.00

## 2024-06-06 HISTORY — DX: Other intervertebral disc degeneration, thoracolumbar region: M51.35

## 2024-06-06 HISTORY — DX: Mixed irritable bowel syndrome: K58.2

## 2024-06-06 HISTORY — DX: Localized edema: R60.0

## 2024-06-06 HISTORY — DX: Cyst of kidney, acquired: N28.1

## 2024-06-06 HISTORY — PX: URETERAL BIOPSY: SHX6688

## 2024-06-06 HISTORY — DX: Atherosclerosis of aorta: I70.0

## 2024-06-06 HISTORY — PX: CYSTOSCOPY/URETEROSCOPY/HOLMIUM LASER/STENT PLACEMENT: SHX6546

## 2024-06-06 LAB — GLUCOSE, CAPILLARY
Glucose-Capillary: 91 mg/dL (ref 70–99)
Glucose-Capillary: 92 mg/dL (ref 70–99)

## 2024-06-06 SURGERY — CYSTOSCOPY/URETEROSCOPY/HOLMIUM LASER/STENT PLACEMENT
Anesthesia: General | Site: Ureter | Laterality: Right

## 2024-06-06 MED ORDER — PHENYLEPHRINE 80 MCG/ML (10ML) SYRINGE FOR IV PUSH (FOR BLOOD PRESSURE SUPPORT)
PREFILLED_SYRINGE | INTRAVENOUS | Status: DC | PRN
  Administered 2024-06-06 (×2): 80 ug via INTRAVENOUS
  Administered 2024-06-06: 40 ug via INTRAVENOUS

## 2024-06-06 MED ORDER — ROCURONIUM BROMIDE 10 MG/ML (PF) SYRINGE
PREFILLED_SYRINGE | INTRAVENOUS | Status: AC
  Filled 2024-06-06: qty 10

## 2024-06-06 MED ORDER — HYDROCODONE-ACETAMINOPHEN 5-325 MG PO TABS: 0.5000 | ORAL_TABLET | Freq: Four times a day (QID) | ORAL | 0 refills | Status: AC | PRN

## 2024-06-06 MED ORDER — ACETAMINOPHEN 500 MG PO TABS
1000.0000 mg | ORAL_TABLET | Freq: Once | ORAL | Status: AC
  Administered 2024-06-06: 1000 mg via ORAL

## 2024-06-06 MED ORDER — CHLORHEXIDINE GLUCONATE 0.12 % MT SOLN
OROMUCOSAL | Status: AC
  Filled 2024-06-06: qty 15

## 2024-06-06 MED ORDER — LIDOCAINE HCL (CARDIAC) PF 100 MG/5ML IV SOSY
PREFILLED_SYRINGE | INTRAVENOUS | Status: DC | PRN
  Administered 2024-06-06: 100 mg via INTRAVENOUS

## 2024-06-06 MED ORDER — SUCCINYLCHOLINE CHLORIDE 200 MG/10ML IV SOSY
PREFILLED_SYRINGE | INTRAVENOUS | Status: DC | PRN
  Administered 2024-06-06: 100 mg via INTRAVENOUS

## 2024-06-06 MED ORDER — OXYCODONE HCL 5 MG/5ML PO SOLN: 5.0000 mg | Freq: Once | ORAL | Status: AC | PRN

## 2024-06-06 MED ORDER — ONDANSETRON HCL 4 MG/2ML IJ SOLN
INTRAMUSCULAR | Status: AC
  Filled 2024-06-06: qty 2

## 2024-06-06 MED ORDER — SODIUM CHLORIDE 0.9 % IV SOLN: INTRAVENOUS | Status: DC

## 2024-06-06 MED ORDER — DEXAMETHASONE SODIUM PHOSPHATE 10 MG/ML IJ SOLN
INTRAMUSCULAR | Status: DC | PRN
  Administered 2024-06-06: 4 mg via INTRAVENOUS

## 2024-06-06 MED ORDER — DEXAMETHASONE SODIUM PHOSPHATE 10 MG/ML IJ SOLN
INTRAMUSCULAR | Status: AC
  Filled 2024-06-06: qty 1

## 2024-06-06 MED ORDER — SUGAMMADEX SODIUM 200 MG/2ML IV SOLN
INTRAVENOUS | Status: DC | PRN
  Administered 2024-06-06: 200 mg via INTRAVENOUS

## 2024-06-06 MED ORDER — OXYCODONE HCL 5 MG PO TABS
5.0000 mg | ORAL_TABLET | Freq: Once | ORAL | Status: AC | PRN
  Administered 2024-06-06: 5 mg via ORAL

## 2024-06-06 MED ORDER — ACETAMINOPHEN 10 MG/ML IV SOLN: 1000.0000 mg | Freq: Once | INTRAVENOUS | Status: DC | PRN

## 2024-06-06 MED ORDER — PHENYLEPHRINE 80 MCG/ML (10ML) SYRINGE FOR IV PUSH (FOR BLOOD PRESSURE SUPPORT)
PREFILLED_SYRINGE | INTRAVENOUS | Status: AC
  Filled 2024-06-06: qty 10

## 2024-06-06 MED ORDER — EPHEDRINE 5 MG/ML INJ
INTRAVENOUS | Status: AC
  Filled 2024-06-06: qty 5

## 2024-06-06 MED ORDER — FENTANYL CITRATE (PF) 100 MCG/2ML IJ SOLN
INTRAMUSCULAR | Status: DC | PRN
  Administered 2024-06-06 (×2): 50 ug via INTRAVENOUS

## 2024-06-06 MED ORDER — TROSPIUM CHLORIDE 20 MG PO TABS: 20.0000 mg | ORAL_TABLET | Freq: Two times a day (BID) | ORAL | 0 refills | Status: AC | PRN

## 2024-06-06 MED ORDER — SODIUM CHLORIDE 0.9 % IR SOLN
Status: DC | PRN
  Administered 2024-06-06 (×2): 3000 mL

## 2024-06-06 MED ORDER — SUCCINYLCHOLINE CHLORIDE 200 MG/10ML IV SOSY
PREFILLED_SYRINGE | INTRAVENOUS | Status: AC
  Filled 2024-06-06: qty 10

## 2024-06-06 MED ORDER — DROPERIDOL 2.5 MG/ML IJ SOLN: 0.6250 mg | Freq: Once | INTRAMUSCULAR | Status: DC | PRN

## 2024-06-06 MED ORDER — OXYCODONE HCL 5 MG PO TABS
ORAL_TABLET | ORAL | Status: AC
  Filled 2024-06-06: qty 1

## 2024-06-06 MED ORDER — IOHEXOL 180 MG/ML  SOLN
INTRAMUSCULAR | Status: DC | PRN
  Administered 2024-06-06 (×2): 10 mL

## 2024-06-06 MED ORDER — CHLORHEXIDINE GLUCONATE 0.12 % MT SOLN
15.0000 mL | Freq: Once | OROMUCOSAL | Status: AC
  Administered 2024-06-06: 15 mL via OROMUCOSAL

## 2024-06-06 MED ORDER — ACETAMINOPHEN 500 MG PO TABS
ORAL_TABLET | ORAL | Status: AC
  Filled 2024-06-06: qty 2

## 2024-06-06 MED ORDER — ORAL CARE MOUTH RINSE: 15.0000 mL | Freq: Once | OROMUCOSAL | Status: AC

## 2024-06-06 MED ORDER — STERILE WATER FOR IRRIGATION IR SOLN
Status: DC | PRN
  Administered 2024-06-06: 500 mL

## 2024-06-06 MED ORDER — PROPOFOL 10 MG/ML IV BOLUS
INTRAVENOUS | Status: DC | PRN
  Administered 2024-06-06: 100 mg via INTRAVENOUS

## 2024-06-06 MED ORDER — LIDOCAINE HCL (PF) 2 % IJ SOLN
INTRAMUSCULAR | Status: AC
  Filled 2024-06-06: qty 5

## 2024-06-06 MED ORDER — ROCURONIUM BROMIDE 100 MG/10ML IV SOLN
INTRAVENOUS | Status: DC | PRN
  Administered 2024-06-06: 30 mg via INTRAVENOUS
  Administered 2024-06-06: 10 mg via INTRAVENOUS

## 2024-06-06 MED ORDER — FENTANYL CITRATE (PF) 100 MCG/2ML IJ SOLN
INTRAMUSCULAR | Status: AC
  Filled 2024-06-06: qty 2

## 2024-06-06 MED ORDER — ONDANSETRON HCL 4 MG/2ML IJ SOLN
INTRAMUSCULAR | Status: DC | PRN
  Administered 2024-06-06: 4 mg via INTRAVENOUS

## 2024-06-06 MED ORDER — SODIUM CHLORIDE 0.9 % IV SOLN
1.0000 g | INTRAVENOUS | Status: AC
  Administered 2024-06-06: 1 g via INTRAVENOUS
  Filled 2024-06-06: qty 10

## 2024-06-06 MED ORDER — FENTANYL CITRATE (PF) 100 MCG/2ML IJ SOLN: 25.0000 ug | INTRAMUSCULAR | Status: DC | PRN

## 2024-06-06 SURGICAL SUPPLY — 24 items
BAG DRAIN SIEMENS DORNER NS (MISCELLANEOUS) ×1 IMPLANT
BASKET ZERO TIP 1.9FR (BASKET) IMPLANT
BRUSH SCRUB EZ 4% CHG (MISCELLANEOUS) ×1 IMPLANT
CATH URET FLEX-TIP 2 LUMEN 10F (CATHETERS) IMPLANT
CATH URETL OPEN END 6X70 (CATHETERS) IMPLANT
CNTNR URN SCR LID CUP LEK RST (MISCELLANEOUS) IMPLANT
DRAPE UTILITY 15X26 TOWEL STRL (DRAPES) ×1 IMPLANT
FIBER LASER MOSES 200 DFL (Laser) IMPLANT
FIBER LASER MOSES 365 DFL (Laser) IMPLANT
FORCEPS BIOP PIRANHA Y (CUTTING FORCEPS) IMPLANT
GLOVE BIOGEL PI IND STRL 7.5 (GLOVE) ×1 IMPLANT
GOWN STRL REUS W/ TWL LRG LVL3 (GOWN DISPOSABLE) ×1 IMPLANT
GOWN STRL REUS W/ TWL XL LVL3 (GOWN DISPOSABLE) ×1 IMPLANT
GUIDEWIRE STR DUAL SENSOR (WIRE) ×1 IMPLANT
KIT TURNOVER CYSTO (KITS) ×1 IMPLANT
PACK CYSTO AR (MISCELLANEOUS) ×1 IMPLANT
SET CYSTO W/LG BORE CLAMP LF (SET/KITS/TRAYS/PACK) ×1 IMPLANT
SHEATH NAVIGATOR HD 12/14X36 (SHEATH) IMPLANT
SOL .9 NS 3000ML IRR UROMATIC (IV SOLUTION) ×1 IMPLANT
STENT URET 6FRX24 CONTOUR (STENTS) IMPLANT
STENT URET 6FRX26 CONTOUR (STENTS) IMPLANT
SURGILUBE 2OZ TUBE FLIPTOP (MISCELLANEOUS) ×1 IMPLANT
VALVE UROSEAL ADJ ENDO (VALVE) IMPLANT
WATER STERILE IRR 500ML POUR (IV SOLUTION) ×1 IMPLANT

## 2024-06-06 NOTE — Anesthesia Postprocedure Evaluation (Signed)
 Anesthesia Post Note  Patient: Jennifer Maddox  Procedure(s) Performed: CYSTOSCOPY/URETEROSCOPY/HOLMIUM LASER/STENT PLACEMENT (Right: Ureter) BIOPSY, URETER (Right: Ureter)  Patient location during evaluation: PACU Anesthesia Type: General Level of consciousness: awake and alert Pain management: pain level controlled Vital Signs Assessment: post-procedure vital signs reviewed and stable Respiratory status: spontaneous breathing, nonlabored ventilation and respiratory function stable Cardiovascular status: blood pressure returned to baseline and stable Postop Assessment: no apparent nausea or vomiting Anesthetic complications: no   No notable events documented.   Last Vitals:  Vitals:   06/06/24 1126 06/06/24 1136  BP: (!) 158/73 (!) 156/69  Pulse: 85 84  Resp: 17   Temp: (!) 36.1 C   SpO2: 94% 100%    Last Pain:  Vitals:   06/06/24 1126  TempSrc:   PainSc: 3                  Camellia Merilee Louder

## 2024-06-06 NOTE — Transfer of Care (Signed)
 Immediate Anesthesia Transfer of Care Note  Patient: Jennifer Maddox  Procedure(s) Performed: CYSTOSCOPY/URETEROSCOPY/HOLMIUM LASER/STENT PLACEMENT (Right: Ureter) BIOPSY, URETER (Right: Ureter)  Patient Location: PACU  Anesthesia Type:General  Level of Consciousness: awake, alert , oriented, and patient cooperative  Airway & Oxygen Therapy: Patient Spontanous Breathing and Patient connected to face mask oxygen  Post-op Assessment: Report given to RN, Post -op Vital signs reviewed and stable, and Patient moving all extremities X 4  Post vital signs: Reviewed and stable  Last Vitals:  Vitals Value Taken Time  BP 160/72 06/06/24 11:00  Temp    Pulse 75 06/06/24 11:03  Resp 11 06/06/24 11:03  SpO2 100 % 06/06/24 11:03  Vitals shown include unfiled device data.  Last Pain:  Vitals:   06/06/24 0807  TempSrc: Temporal  PainSc: 0-No pain       Patent airway to PACU with circ RN, breathing spontaneously on 6L O2 via FM. Pt awake, responsive and comfortable. VSS.   Complications: No notable events documented.

## 2024-06-06 NOTE — Anesthesia Procedure Notes (Signed)
 Procedure Name: Intubation Date/Time: 06/06/2024 9:40 AM  Performed by: Myra Lawless, CRNAPre-anesthesia Checklist: Patient identified, Patient being monitored, Timeout performed, Emergency Drugs available and Suction available Patient Re-evaluated:Patient Re-evaluated prior to induction Oxygen Delivery Method: Circle system utilized Preoxygenation: Pre-oxygenation with 100% oxygen Induction Type: IV induction Ventilation: Mask ventilation without difficulty Laryngoscope Size: 3 and McGrath Grade View: Grade I Tube type: Oral Tube size: 7.0 mm Number of attempts: 1 Airway Equipment and Method: Stylet Placement Confirmation: ETT inserted through vocal cords under direct vision, positive ETCO2 and breath sounds checked- equal and bilateral Secured at: 21 cm Tube secured with: Tape Dental Injury: Teeth and Oropharynx as per pre-operative assessment  Comments: DL x1 with McGrath MAC 3 blade, grade 1 view. Atraumatic intubation. Dentition unchanged from preop baseline (edentulous/dentures removed in preop)

## 2024-06-06 NOTE — Interval H&P Note (Signed)
 History and Physical Interval Note:  06/06/2024 9:18 AM  Jennifer Maddox  has presented today for surgery, with the diagnosis of Right Ureteral Stone.  The various methods of treatment have been discussed with the patient and family. After consideration of risks, benefits and other options for treatment, the patient has consented to  Procedure(s): CYSTOSCOPY/URETEROSCOPY/HOLMIUM LASER/STENT PLACEMENT (Right) as a surgical intervention.  The patient's history has been reviewed, patient examined, no change in status, stable for surgery.  I have reviewed the patient's chart and labs.  Questions were answered to the patient's satisfaction.    CV:RRR Lungs:clear  Jennifer Maddox

## 2024-06-06 NOTE — Discharge Instructions (Signed)
 DISCHARGE INSTRUCTIONS FOR KIDNEY STONE/URETERAL STENT   MEDICATIONS:  1. Resume all your other meds from home.  2.  AZO (over-the-counter) can help with the burning/stinging when you urinate. 3.  Hydrocodone  is for moderate/severe pain, Rx was sent to your pharmacy. 4.  Trospium is for bladder/stent irritation, Rx was sent to your pharmacy  ACTIVITY:  1. May resume regular activities in 24 hours. 2. No driving while on narcotic pain medications  3. Drink plenty of water  4. Continue to walk at home - you can still get blood clots when you are at home, so keep active, but don't over do it.  5. May return to work/school tomorrow or when you feel ready    SIGNS/SYMPTOMS TO CALL:  Common postoperative symptoms include urinary frequency, urgency, bladder spasm and blood in the urine  Please call us  if you have a fever greater than 101.5, uncontrolled nausea/vomiting, uncontrolled pain, dizziness, unable to urinate, excessively bloody urine, chest pain, shortness of breath, leg swelling, leg pain, or any other concerns or questions.   You can reach us  at [phone].   FOLLOW-UP:  1. You will be contacted for a follow-up appointment for stent removal in approximately 10 days

## 2024-06-06 NOTE — Anesthesia Preprocedure Evaluation (Addendum)
 Anesthesia Evaluation  Patient identified by MRN, date of birth, ID band Patient awake    Reviewed: Allergy & Precautions, H&P , NPO status , Patient's Chart, lab work & pertinent test results  Airway Mallampati: II  TM Distance: >3 FB Neck ROM: full    Dental  (+) Upper Dentures, Lower Dentures   Pulmonary shortness of breath, sleep apnea and Continuous Positive Airway Pressure Ventilation , COPD, former smoker   Pulmonary exam normal        Cardiovascular hypertension, pulmonary hypertension+ Valvular Problems/Murmurs AS  + Systolic murmurs ECHO 12/24: CONCLUSION -------------------------------------------------------------------------------  NORMAL LEFT VENTRICULAR SYSTOLIC FUNCTION WITH MILD LVH  ESTIMATED EF: >55%  NORMAL LA PRESSURES WITH DIASTOLIC DYSFUNCTION (GRADE 1)  NORMAL RIGHT VENTRICULAR SYSTOLIC FUNCTION  VALVULAR REGURGITATION: No AR, MILD MR, MILD PR, MILD TR  VALVULAR STENOSIS: MILD AS, No MS, No PS, No TS  PHYSICIAN IMPRESSIONS --------------------------------------------------------------------  MILD to MODERATE TR  MILD PHTN  LA AREA 22cm^2  MILDLY DILATED LA  MILD AS     Neuro/Psych TIA (2008) negative psych ROS   GI/Hepatic negative GI ROS,,, NAFLD   Endo/Other  diabetes, Type 2    Renal/GU Renal disease     Musculoskeletal  (+) Arthritis ,    Abdominal  (+) + obese  Peds  Hematology negative hematology ROS (+)   Anesthesia Other Findings Past Medical History: No date: Adenoma of left adrenal gland No date: Anemia No date: Aortic atherosclerosis (HCC) No date: Aortic valve stenosis No date: Arthritis No date: Bilateral renal cysts No date: Breast cancer, left (HCC) No date: Colon polyps No date: COPD (chronic obstructive pulmonary disease) (HCC) No date: DDD (degenerative disc disease), thoracolumbar No date: Degenerative disc disease, lumbar No date: Diverticulosis No date: DM  (diabetes mellitus), type 2 (HCC) No date: Dyspnea No date: History of kidney stones No date: Hyperlipidemia No date: Hypertension No date: Insomnia     Comment:  a.) uses melatonin + trazodone  PRN No date: Irritable bowel syndrome with both constipation and diarrhea No date: Long-term use of aspirin therapy No date: NAFLD (nonalcoholic fatty liver disease) No date: Obesity No date: OSA on CPAP No date: Peripheral edema No date: Personal history of radiation therapy No date: Pulmonary hypertension (HCC) No date: Right inguinal hernia 06/2007: TIA (transient ischemic attack)  Past Surgical History: No date: ABDOMINAL HYSTERECTOMY     Comment:  around age 78 ; ovaries intact 12/01/2018: BREAST BIOPSY; Left     Comment:  Pathology LEFT breast mass 11 o'clock location: Invasive              mammary 2011: BREAST EXCISIONAL BIOPSY; Left     Comment:  breast ca with radation No date: CHOLECYSTECTOMY No date: COLONOSCOPY W/ POLYPECTOMY 10/29/2017: COLONOSCOPY WITH PROPOFOL ; N/A     Comment:  Procedure: COLONOSCOPY WITH PROPOFOL ;  Surgeon: Viktoria Lamar DASEN, MD;  Location: [facility] ENDOSCOPY;  Service:               Endoscopy;  Laterality: N/A; 07/24/2022: COLONOSCOPY WITH PROPOFOL ; N/A     Comment:  Procedure: COLONOSCOPY WITH PROPOFOL ;  Surgeon:               Maryruth Ole DASEN, MD;  Location: [facility] ENDOSCOPY;                Service: Endoscopy;  Laterality: N/A; 10/29/2017: ESOPHAGOGASTRODUODENOSCOPY (EGD) WITH PROPOFOL ; N/A     Comment:  Procedure: ESOPHAGOGASTRODUODENOSCOPY (EGD) WITH               PROPOFOL ;  Surgeon: Viktoria Lamar DASEN, MD;  Location:               [facility] ENDOSCOPY;  Service: Endoscopy;  Laterality: N/A; No date: KNEE ARTHROSCOPY W/ MENISCAL REPAIR; Right 12/2023: ROTATOR CUFF REPAIR; Right 01/17/2019: SENTINEL NODE BIOPSY; Left     Comment:  Procedure: SENTINEL NODE INJECTION;  Surgeon: Tye Millet, DO;  Location: [facility] ORS;  Service:  General;                Laterality: Left; No date: TONSILLECTOMY 01/17/2019: TOTAL MASTECTOMY; Left     Comment:  Procedure: TOTAL MASTECTOMY;  Surgeon: Tye Millet, DO;              Location: [facility] ORS;  Service: General;  Laterality: Left;     Reproductive/Obstetrics negative OB ROS                             Anesthesia Physical Anesthesia Plan  ASA: 3  Anesthesia Plan: General   Post-op Pain Management: Tylenol  PO (pre-op)*   Induction: Intravenous  PONV Risk Score and Plan: 2 and Ondansetron  and Dexamethasone   Airway Management Planned: LMA and Oral ETT  Additional Equipment:   Intra-op Plan:   Post-operative Plan: Extubation in OR  Informed Consent: I have reviewed the patients History and Physical, chart, labs and discussed the procedure including the risks, benefits and alternatives for the proposed anesthesia with the patient or authorized representative who has indicated his/her understanding and acceptance.     Dental Advisory Given  Plan Discussed with: CRNA and Surgeon  Anesthesia Plan Comments:         Anesthesia Quick Evaluation

## 2024-06-06 NOTE — Op Note (Signed)
 Preoperative diagnosis:  Right mid ureteral calculus  Postoperative diagnosis:  Right distal ureteral calculus Abnormal mucosa distal ureter  Procedure:  Cystoscopy Right ureteroscopy and stone removal Ureteroscopic laser lithotripsy Right ureteral stent placement (66F/24 cm) Right retrograde pyelography with interpretation  Surgeon: Glendia C. Naman Spychalski, M.D.  Anesthesia: General  Complications: None  Intraoperative findings:  Cystoscopy: Bladder mucosa without solid or papillary lesions; UOs normal-appearing bilaterally. Ureteroscopy: 6 mm calculus identified in the distal ureter.  Just proximal to the stone was an area of papillary mucosal change most likely secondary to inflammatory changes from the stone.  Multiple biopsies of this tissue were obtained Right retrograde pyelography post procedure showed no filling defects, stone fragments or contrast extravasation  EBL: Minimal  Specimens: Calculus fragments for analysis   Indication: Jennifer Maddox is a 78 y.o.  female with an obstructing 6 mm mid ureteral calculus.  Refer to admission H&P for details.  After reviewing the management options for treatment, the patient elected to proceed with the above surgical procedure(s). We have discussed the potential benefits and risks of the procedure, side effects of the proposed treatment, the likelihood of the patient achieving the goals of the procedure, and any potential problems that might occur during the procedure or recuperation. Informed consent has been obtained.  Description of procedure:  The patient was taken to the operating room and general anesthesia was induced.  The patient was placed in the dorsal lithotomy position, prepped and draped in the usual sterile fashion, and preoperative antibiotics were administered. A preoperative time-out was performed.   A 21 French cystoscope was lubricated and placed per urethra.  Panendoscopy was performed with findings as  described above  Attention was directed to the right ureteral orifice and a 0.038 Sensor wire was then advanced up the ureter into the renal pelvis under fluoroscopic guidance.  A 4.5 Fr semirigid ureteroscope was unable to be placed into the ureter alongside the guidewire.  A second Sensor wire was placed through the ureteroscope and into the UO.  The scope was advanced over the guidewire.  The distal 2 cm of the ureter was tight without a definite stricture.  The calculus was identified in the distal ureter.  The ureteroscope was advanced to the lower proximal ureter.  Papillary tissue was noted just proximal to the calculus.   The stone was then dusted with a 365 m Moses holmium laser fiber at a setting of 0.3 J/80 hz and increased to 0.5J/80 Hz.  The stone was hard and several fragments were chipped off during dusting which were removed with a 1.5F nitinol 0 tip basket.  Multiple biopsies of the papillary tissue in the distal ureter were then obtained with Piranha forceps.  No significant bleeding was noted.  The ureteroscope was then advanced to the UPJ and no stone fragments were identified.  Retrograde pyelogram was performed with findings as described above.  A 6 F/24 cm Contour ureteral stent without tether was placed under fluoroscopic guidance.  The wire was then removed with an adequate stent curl noted in the renal pelvis as well as in the bladder.  The bladder was then emptied and the procedure ended.  The patient appeared to tolerate the procedure well and without complications.  After anesthetic reversal the patient was transported to the PACU in stable condition.   Plan: Office follow-up 10-14 days for cystoscopy/stent removal Patient will be contacted with the ureteral biopsy results  Glendia Barba, MD

## 2024-06-07 ENCOUNTER — Telehealth: Payer: Self-pay

## 2024-06-07 ENCOUNTER — Encounter: Payer: Self-pay | Admitting: Urology

## 2024-06-07 LAB — SURGICAL PATHOLOGY

## 2024-06-07 NOTE — Telephone Encounter (Signed)
 Patient called stating that she had ureteroscopy with stent placement on 06/06/24. She noticed that with the last 2 urination that she has darker red blood in her urine than she did before. No pain or pressure no other symptoms. Patient has been more active today. Patient was advised that this is normal and we went over what is normal and can be expected with a stent as listed below. Patient does not look at [facility]. Patient verbalized understanding.  Tips for tolerating your ureteral stent after your urologic procedure Your urologist placed a stent into your ureter (the tube that connects your kidney to your bladder). This ureteral stent functions much like a tiny straw that has as small coil in the kidney and in the bladder to help hold it in place and to keep your ureter open until the healing process is complete. If the stent is removed prior to the resolution of swelling, you may experience significant discomfort. This pain is very similar to the pain experienced with a kidney or ureteral stone. Therefore, we urge you to leave the stent in for the prescribed amount of time.  To improve the tolerability of the stent, several medications are available.   This is a medication that helps relieve bladder cramps and spasms often experienced with a ureteral stent.    Frequently Asked Questions about Ureteral Stents: Q Is it normal to have discomfort present? A Yes. In fact, certain movements may increase discomfort. Examples include: frequent bending and increased activity. Q Is it normal to feel like I have to urinate more often? A Yes,  Q Is it normal to feel pain or pressure before and/or during urination? A Yes, some patients have significant pain in the back during urination because the stent allows urine to flow back toward the kidney. Others may experience pain in the bladder because the stent may cause a bladder cramp during or at the end of urination. Q Is it normal to have blood in  my urine and what if it is not present all the time? A Yes. The stent will cause some irritation to tissue, it may be present while you have the stent and after you remove the stent. The blood may be a light red or darker at times. Don't be alarmed if the blood resolves and then returns. This is typical for most patients

## 2024-06-08 ENCOUNTER — Ambulatory Visit: Payer: Self-pay | Admitting: Urology

## 2024-06-14 ENCOUNTER — Ambulatory Visit (INDEPENDENT_AMBULATORY_CARE_PROVIDER_SITE_OTHER): Payer: Medicare (Managed Care) | Admitting: Urology

## 2024-06-14 ENCOUNTER — Encounter: Payer: Self-pay | Admitting: Urology

## 2024-06-14 VITALS — BP 157/81 | HR 77 | Ht 65.0 in | Wt 203.0 lb

## 2024-06-14 DIAGNOSIS — C661 Malignant neoplasm of right ureter: Secondary | ICD-10-CM

## 2024-06-14 DIAGNOSIS — Z466 Encounter for fitting and adjustment of urinary device: Secondary | ICD-10-CM

## 2024-06-14 DIAGNOSIS — N132 Hydronephrosis with renal and ureteral calculous obstruction: Secondary | ICD-10-CM

## 2024-06-14 LAB — STONE ANALYSIS
Calcium Oxalate Dihydrate: 20 %
Calcium Oxalate Monohydrate: 80 %
Weight Calculi: 2 mg

## 2024-06-14 LAB — URINALYSIS, COMPLETE
Bilirubin, UA: NEGATIVE
Glucose, UA: NEGATIVE
Nitrite, UA: POSITIVE — AB
Specific Gravity, UA: 1.03 (ref 1.005–1.030)
Urobilinogen, Ur: 1 mg/dL (ref 0.2–1.0)
pH, UA: 5 (ref 5.0–7.5)

## 2024-06-14 LAB — MICROSCOPIC EXAMINATION: RBC, Urine: >30 /HPF — AB (ref 0–2)

## 2024-06-14 MED ORDER — SULFAMETHOXAZOLE-TRIMETHOPRIM 800-160 MG PO TABS: 1.0000 | ORAL_TABLET | Freq: Two times a day (BID) | ORAL | Status: DC

## 2024-06-14 MED ORDER — SULFAMETHOXAZOLE-TRIMETHOPRIM 800-160 MG PO TABS
1.0000 | ORAL_TABLET | Freq: Once | ORAL | Status: AC
  Administered 2024-06-14: 1 via ORAL

## 2024-06-16 ENCOUNTER — Encounter: Payer: Self-pay | Admitting: Urology

## 2024-06-16 NOTE — Progress Notes (Signed)
   Indications: Patient is 78 y.o., who is s/p ureteroscopic removal of a 6 mm right distal ureteral calculus 06/02/2024.  Incidentally noted to have abnormal appearing ureteral mucosa just proximal to the calculus and multiple biopsies were obtained.  She had no postoperative problems.  The patient is presenting today for stent removal.  Her daughter was with her today  Procedure:  Flexible Cystoscopy with stent removal (47689)  Timeout was performed and the correct patient, procedure and participants were identified.    Description:  The patient was prepped and draped in the usual sterile fashion. Flexible cystosopy was performed.  Vision was impaired due to mild hematuria.  The ureteroscope was removed and a Robinson catheter was placed and the bladder irrigated until clear.  The cystoscope was replaced and the stent was visualized, grasped, and removed intact without difficulty. The patient tolerated the procedure well.  A single dose of oral antibiotics was given.  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  Since the tumor is high-grade I have recommended referral to [facility] to discuss options of laser ablation and distal ureterectomy with reimplant    Glendia Barba, MD

## 2024-07-03 ENCOUNTER — Other Ambulatory Visit: Payer: Self-pay | Admitting: Adult Health

## 2024-07-03 DIAGNOSIS — C680 Malignant neoplasm of urethra: Secondary | ICD-10-CM

## 2024-07-03 DIAGNOSIS — C669 Malignant neoplasm of unspecified ureter: Secondary | ICD-10-CM

## 2024-07-12 ENCOUNTER — Ambulatory Visit
Admission: RE | Admit: 2024-07-12 | Discharge: 2024-07-12 | Disposition: A | Discharge status: Home / Self Care | Payer: Medicare (Managed Care) | Source: Ambulatory Visit | Attending: Adult Health | Admitting: Adult Health

## 2024-07-12 DIAGNOSIS — C669 Malignant neoplasm of unspecified ureter: Secondary | ICD-10-CM | POA: Insufficient documentation

## 2024-07-12 DIAGNOSIS — C680 Malignant neoplasm of urethra: Secondary | ICD-10-CM | POA: Diagnosis present

## 2024-07-14 DIAGNOSIS — C661 Malignant neoplasm of right ureter: Secondary | ICD-10-CM | POA: Diagnosis not present

## 2024-07-27 DIAGNOSIS — N3000 Acute cystitis without hematuria: Secondary | ICD-10-CM | POA: Diagnosis not present

## 2024-07-31 DIAGNOSIS — I5030 Unspecified diastolic (congestive) heart failure: Secondary | ICD-10-CM | POA: Diagnosis not present

## 2024-07-31 DIAGNOSIS — C669 Malignant neoplasm of unspecified ureter: Secondary | ICD-10-CM | POA: Diagnosis not present

## 2024-07-31 DIAGNOSIS — I110 Hypertensive heart disease with heart failure: Secondary | ICD-10-CM | POA: Diagnosis not present

## 2024-07-31 DIAGNOSIS — C689 Malignant neoplasm of urinary organ, unspecified: Secondary | ICD-10-CM | POA: Diagnosis not present

## 2024-07-31 DIAGNOSIS — Z79899 Other long term (current) drug therapy: Secondary | ICD-10-CM | POA: Diagnosis not present

## 2024-07-31 DIAGNOSIS — G4733 Obstructive sleep apnea (adult) (pediatric): Secondary | ICD-10-CM | POA: Diagnosis not present

## 2024-07-31 DIAGNOSIS — C661 Malignant neoplasm of right ureter: Secondary | ICD-10-CM | POA: Diagnosis not present

## 2024-07-31 DIAGNOSIS — J449 Chronic obstructive pulmonary disease, unspecified: Secondary | ICD-10-CM | POA: Diagnosis not present

## 2024-07-31 DIAGNOSIS — E785 Hyperlipidemia, unspecified: Secondary | ICD-10-CM | POA: Diagnosis not present

## 2024-07-31 DIAGNOSIS — N131 Hydronephrosis with ureteral stricture, not elsewhere classified: Secondary | ICD-10-CM | POA: Diagnosis not present

## 2024-08-24 DIAGNOSIS — Z79899 Other long term (current) drug therapy: Secondary | ICD-10-CM | POA: Diagnosis not present

## 2024-08-24 DIAGNOSIS — C669 Malignant neoplasm of unspecified ureter: Secondary | ICD-10-CM | POA: Diagnosis not present

## 2024-08-24 NOTE — Progress Notes (Signed)
 [facility] Urologic Oncology Consultation Note Result date not found.  Urologic Surgeon: Ronal FORBES Cane, MD Associate Professor of Urology  Reason for Consultation:  Referring Provider: Ronal Almarie Cane   REFERRING PROVIDER: Ronal Almarie Cane  ASSESSMENT: Jennifer Maddox is a 78 y.o. female with cT3.  We discussed the role of neoadjuvant chemotherapy briefly and the patient has met with Dr. Rumalda from our medical oncology team to discuss the benefits and risks of neoadjuvant chemotherapy particularly in the context of the POUT study.  Please see their note for further details.  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  We discussed the gold standard for treatment of high-grade upper tract urothelial cancer which involves radical nephroureterectomy with lymph node dissection.  I described my robotic surgical approach which would include removal of the kidney, ureter, and bladder cuff along with a pelvic lymph node dissection given the location of the tumor in the distal ureter.  I explained that I perform a small bladder cuff and bladder closure which requires a Foley catheter postoperatively and cystogram approximately 1 week following surgery.  I also discussed the role of perioperative gemcitabine given Level 1 evidence of its utility in preventing future lower tract recurrences.  I discussed the benefits and risks of the surgery which include but are not limited to bleeding, infection, damage to adjacent organs including the bowel as well as other risks such as urine leak, heart attack, stroke, blood clots and other anesthesia-related complications. I explained that most patients expect a 1 to 2-day length of stay but may require longer.  I answered all questions today and the patient is interested in proceeding with neoadjuvant chemotherapy followed by right radical nephroureterectomy  with pelvic lymph node dissection.  Urology Surgery Scheduling Checklist  Timing: 12/15/24 Attending: Dr. Cane Urology Pre-op:  No Pre Anesthesia (Precare):  No Urine:  No urine Testing Plan:  No additional testing Blood thinners:  None GLP1 agonist: Does not take a GLP1 agonist Cardiac/Anticoagulation Clearance: Not indicated Post Op Appointment(s): MD (Patient's Surgeon) in 10 days telephone Special requests: None  PLAN: NAC + right nephU with PLND 2.   Will tentatively schedule for surgery early Jan and adjust based on # of cycles tolerated   Future Appointments  Date Time Provider Department Center  08/24/2024  9:00 AM Rumalda Randine Helling, MD [facility]  08/24/2024 10:00 AM Cane Ronal Almarie, MD [facility]    This visit for upper tract urothelial carcinoma , a chronic problem that may pose a threat to life or bodily function and is not yet at treatment goal, fulfills the E/M requirements for a high complexity visit given potential consequences when appropriately treated, need to initiate or forego further testing, treatment and/or hospitalization. Data points obtained and summarized in oncologic history below above and included history obtained from review of outside visit records, review of laboratory and pathology results, independent visualization and interpretation of imaging.   This visit fulfills criteria for use of G2211 modifier as an encounter related to ongoing medical care related to the patient's single, serious condition or complex condition.   REASON FOR VISIT:  No chief complaint on file.   Oncologic History  Date Test/Procedure Results  2024-04-03 creatinine 0.7  2024-06-06 Biopsy HG urothelial carcinoma  2024-06-26 CTAP 6mm stone distal right ureteral with abnormal soft tissue thickening  2024-07-14 CT Chest NED  2024-07-31 Cysto/retrograde/   2024-08-24 [facility] evaluation cT3 urothelial  carcinoma    eGFR 63; creatinine 0.92       HISTORY OF PRESENT ILLNESS:  Jennifer Maddox is a 78 y.o. 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  Good performance status - [place] Independent  PAST MEDICAL HISTORY:  Past Medical History[1]  PAST SURGICAL HISTORY:  Past Surgical History[2]  MEDICATIONS:  Current Rx[3]  ALLERGIES:  Allergies[4]  FAMILY HISTORY:  Family History[5]  SOCIAL HISTORY:   reports that she has quit smoking. Her smoking use included cigarettes. She has never used smokeless tobacco. No history on file for alcohol use and drug use.  PHYSICAL EXAM:  GENERAL: Pleasant female in no acute distress.  VITAL SIGNS: There were no vitals taken for this visit. PULMONARY: Normal work of breathing, no use of accessory muscles ABDOMEN: Soft, non-tender, non-distended. No organomegaly or hernias. PSYCHOLOGIC: Normal affect, normal mood  REVIEW OF SYSTEMS: Negative upon 10 system review other than what is mentioned in the HPI.  LAB RESULTS:  Results for orders placed or performed during the hospital encounter of 07/31/24  POCT Glucose  Result Value Ref Range   Glucose, POC 82 70 - 179 mg/dL  POCT Glucose  Result Value Ref Range   Glucose, POC 79 70 - 179 mg/dL           [8] No past medical history on file. [2] Past Surgical History: Procedure Laterality Date  . CHG X-RAY CYSTOGRAM, MIN 3 VIEW N/A 07/31/2024   Procedure: CYSTOGRAPHY, MINIMUM OF 3 VIEWS, RADIOLOGICAL SUPERVISION AND INTERPRETATION;  Surgeon: Lennard Ronal Norris, MD;  Location: [facility] PROCEDURE SUITES [facility];  Service: Urology  . CHG X-RAY RETROGRADE PYELOGRAM Right 07/31/2024   Procedure: CHG X-RAY RETROGRADE PYELOGRAM;  Surgeon: Lennard Ronal Norris, MD;  Location: [facility]URE SUITES [facility];  Service: Urology  . PR CYSTO/URETERO/PYELOSCOPY, DX Right 07/31/2024   Procedure: CYSTOURETHOSCOPY, WITH URETEROSCOPY AND/OR PYELOSCOPY; DIAGNOSTIC;  Surgeon:  Lennard Ronal Norris, MD;  Location: [facility] PROCEDURE SUITES [facility];  Service: Urology  . PR CYSTOURETHROSCOPY,URETER CATHETER Right 07/31/2024   Procedure: CYSTOURETHROSCOPY, W/URETERAL CATHETERIZATION, W/WO IRRIG, INSTILL, OR URETEROPYELOG, EXCLUS OF RADIOLG SVC;  Surgeon: Lennard Ronal Norris, MD;  Location: [facility] PROCEDURE SUITES [facility];  Service: Urology  [3] Current Outpatient Medications  Medication Sig Dispense Refill  . cyanocobalamin , vitamin B-12, 1000 MCG tablet Take 1 tablet (1,000 mcg total) by mouth daily.    . furosemide  (LASIX ) 20 MG tablet Take 1 tablet (20 mg total) by mouth two (2) times a day. Taking 1 x day    . gabapentin  (NEURONTIN ) 300 MG capsule     . melatonin 12 mg TbDL Take 12 mg by mouth.    . pioglitazone (ACTOS) 30 MG tablet Take 1 tablet (30 mg total) by mouth daily.    . potassium chloride 10 MEQ ER tablet Take 1 tablet (10 mEq total) by mouth daily.    . rosuvastatin (CRESTOR) 40 MG tablet     . semaglutide (OZEMPIC) 2 mg/dose (8 mg/3 mL) PnIj Inject 2 mg under the skin. Taking on Friday    . traZODone  (DESYREL ) 50 MG tablet     . venlafaxine (EFFEXOR) 75 MG tablet Take 1 tablet (75 mg total) by mouth.     No current facility-administered medications for this visit.  [4] Allergies Allergen Reactions  . Atorvastatin Other (See Comments)    Other Reaction(s): Other (See Comments)  Cramps  . Naproxen Sodium Other (See Comments)    Other Reaction(s):  Other (See Comments)  Head and Neck Puritis  . Ibuprofen  Itching  [5] No family history on file.

## 2024-08-24 NOTE — Progress Notes (Signed)
 Initial Genitourinary Oncology Clinic Note  Patient Name: Jennifer Maddox Encounter Date: 08/24/2024 Urologist: Lennard  Assessment/Plan:  78 y.o. with T2DM, OSA on CPCP, mild aortic stenosis, who presents with new diagnosis of right ureteral cancer.  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 We reviewed that no level 1 evidence exists for neoadjuvant chemotherapy in UTUC, but that data extrapolated from the bladder cancer literature suggests a benefit. Additionally, we reviewed the difficulty in accurate preop staging of UTUC and the inherent decline in renal function after nephroureterectomy.  We then discussed the typical regimen of gemcitabine and cisplatin chemotherapy, including rationale, treatment plan and potential side effects including but not limited to fatigue, asthenia, alopecia, nausea, vomiting, myelosuppression with risks of infection and bleeding, nephrotoxicity, neurotoxicity, ototoxicity, thrombosis, less common liver and lung toxicity as well as others. Also discussed electrolyte abnormalities include possible need for magnesium or potassium supplementation. While the NIAGARA regimen has been widely adopted for MIBC, there is not yet data to support incorporation of immunotherapy in UTUC and would not add in her case.  She has several risk factors for cisplatin toxicity including subjective hearing loss, low WBC, mild aortic stenosis, and age 31. However, her GFR is 72 per CG and she has no neuropathy and excellent functional status. On discussion with urology, there is suspected cT3 disease and she has a clear mass in her ureter on imaging with high grade pathology. Therefore, will get baseline audiology exam and TTE and then likely proceed with NAC with gemcitabine and split dose cisplatin (gem 1g/m2 on day 1 and  8 and cisplatin 35mg /m2 on day 1 and 8) with plan for 4 cycles. If she has substantial toxicity, then can halt chemotherapy and would proceed with nephroureterectomy. We discussed the potential of immunotherapy (or the MODERN trial) post-operatively.  Breast cancer: s/p lumpectomy/XRT on right breast 2011 and s/p left mastectomy in 2021, now on anastrazole.  HTN: on amlodipine  DM: On ozempic.  Plan: - Audiology exam - Repeat TTE - Tentative plan for gemcitabine plus split dose cisplatin x 4; she would like to receive this locally - Nephroureterectomy after chemotherapy  [employer]bridge AI charting was used to document parts of this appointment. While the note was reviewed, discrepancies may unintentionally exist. Decision-making today was high complexity on the basis of discussion of cancer, and weighing use of high risk chemotherapy, toxicity and complications/comorbidity of UTUC.   History of Present Illness:  Jennifer Maddox is a 78 y.o. female who is seen in consultation at the request of Ronal Landry Lennard for an evaluation of upper tract urothelial carcinoma.  She initially presented with intermittent gross hematuria and then acute onset of right flank pain. CT AP 05/18/24 showed a a 6mm ureteral stone and associated moderate right hydronephrosis. She underwent right ureteral stone extraction done with Dr. Kennith on 06/02/24 and noted to have abnormal appearing ureteral mucosa just proximal to the stone. Biopsy pathology showed a high grade papillary UC.  She underwent ureteroscopy 07/31/24 and found to have a 5cm area of right ureteral narrowing, unable to be passed with ureteroscopy.   Breast cancer history Follows with Dr. Melanee at [facility]. Per notes: - Right breast cancer, 2011: T1bN0, s/p lumpectomy, adjuvant RT and 5 years of hormonal therapy - Left breast cancer, 2019: invasive mammary carcinoma, grade 2, ER greater than 90% positive, PR  greater than 90% positive.  HER-2 was equivocal by IHC.   FISH testing negative. Patient underwent left mastectomy on 12/01/2018.  Final pathology showed 6 mm, grade 2 invasive mammary carcinoma with negative margins.  ER PR positive and HER-2/neu negative.  3 sentinel and one non-sentinel lymph node was negative for malignancy.  Oncotype testing came back with intermediate risk score of 18 and given her age patient did not receive any adjuvant chemotherapy.  She did not require postmastectomy radiation and is currently on anastrazole since March 2020. History of Present Illness 08/25/24  She is accompanied by her children, Lonell and Toribio. Today she currently reports no pain and feels fine.  At baseline, she does have some cramps despite taking magnesium supplements. No neuropathy symptoms such as numbness or tingling are reported. She experiences swelling in her ankles and feet, for which she uses a compression device. Last echo 11/2023 with mild aortic stenosis. She does have some subjective hearing loss, never been formally tested.  She is quite active at baseline and can go up a flight of stairs. She is an active Engineer, structural.  Past Medical History: Past Medical History[1] Anemia     . Arthritis    . Breast cancer (HCC) 2011/2019    left breast  . Breast cancer, left breast (HCC) 2011  . COPD (chronic obstructive pulmonary disease) (HCC)    . Degenerative disc disease, lumbar    . Diabetes mellitus without complication (HCC)    . Diverticulosis    . Hyperlipidemia    . Hypertension    . Personal history of radiation therapy    . Sleep apnea    . TIA (transient ischemic attack)    Shoulder surgery in Jan 2025 T2DM on Ozempic, denies neuropathy OSA on CPAP  Medications: Medications Ordered Prior to Encounter[2]  Allergies: Allergies[3] NSAIDs.   Social History: Here with daughter and son. Lives in [place] independently. Kids live nearby. Former smoker, quit 1975 due to daughter's asthma. No alcohol. No other medications. One son died  4 years ago from a stroke after the COVID vaccine.  Family History: Family History[4] Mother had lung cancer (smoker). Son had melanoma.  Review of Systems: A 10 system review was completed and was negative except per HPI  Physical Exam: Vitals reviewed in epic ECOG PS 1  General:   No acute distress, alert and interactive  Eyes:   Pupils equally round.  Extra ocular muscles intact.  Sclera not icteric.  Cardiovascular:  RRR with loud systolic murmur  Lungs:  Clear to auscultation bilaterally, without wheezes/crackles/rhonchi.  Skin:    No rash, lesions or breakdown   Abdomen:   Abdomen soft, not tender and not distended, no hepatosplenomegaly or masses.  Extremities:   Warm and well-perfused with 1+ symmetric edema.  Neurological:  Alert and oriented to person, place and time.   Labs:  Lab Results  Component Value Date   WBC 2.9 (L) 08/24/2024   HGB 12.0 08/24/2024   HCT 36.1 08/24/2024   PLT 190 08/24/2024    Imaging:  CT Chest Interpretation of Outside Film Result Date: 07/14/2024  Impression: No evidence of intrathoracic metastatic disease. Diffuse bronchial wall thickening with scattered peripheral groundglass nodules, likely inflammatory or infectious. Recommend attention on follow-up imaging. Partially imaged moderate right hydronephrosis. Dilatation of the ascending aorta which measures up to 4.4 cm in diameter. Calcified atherosclerosis in the coronary artery distribution. PLEASE NOTE:  Our interpretation of studies performed at an outside institution is limited by  factors such as the absence of technical specifics of the image, undisclosed clinical information, and the unavailability of the original interpretation. Specialists at the institution that performed the study may have access to information not available to us  that could make a difference in this interpretation. We suggest you obtain the original interpretation from the site where the study was performed.  CT  BODY INTERPRETATION OF OUTSIDE FILM [facility]) Result Date: 06/26/2024  Impression: 6 mm stone identified within the distal right ureter with adjacent abnormal soft tissue thickening of the distal ureter, concerning for urothelial carcinoma. Resultant moderate right hydroureteronephrosis. Consider further evaluation with CT urogram. Indeterminate 1.6 cm left adrenal nodule. Chronic and incidental findings as above. PLEASE NOTE:  Our interpretation of studies performed at an outside institution is limited by factors including the absence of technical specifics of the image, undisclosed clinical information and the unavailability of the original interpretation.  Specialists at the institution that performed the study may have access to information not available to us  that could make a difference in this interpretation.  We suggest that you obtain the original interpretation from the site where the study was performed.    Pathology:  Diagnosis  Date Value Ref Range Status  06/06/2024   Final   (Outside case: [phone]", 1 slides, original collection date 06/06/2024)  A: Ureter, right, biopsy: - Non-invasive high grade papillary urothelial carcinoma (detached, superficial fragments) - No muscularis propria is present for evaluation. - Scant fibrotic stroma with crystal deposition, consistent with history of ureteral stones  This electronic signature is attestation that the pathologist personally reviewed the submitted material(s) and the final diagnosis reflects that evaluation.         [1] No past medical history on file. [2] Current Outpatient Medications on File Prior to Visit  Medication Sig Dispense Refill  . [EXPIRED] cefdinir (OMNICEF) 300 MG capsule Take 1 capsule (300 mg total) by mouth two (2) times a day for 5 days. 10 capsule 0  . cyanocobalamin , vitamin B-12, 1000 MCG tablet Take 1 tablet (1,000 mcg total) by mouth daily.    . furosemide  (LASIX ) 20 MG tablet Take 1 tablet (20 mg  total) by mouth two (2) times a day. Taking 1 x day    . gabapentin  (NEURONTIN ) 300 MG capsule     . melatonin 12 mg TbDL Take 12 mg by mouth.    . [EXPIRED] phenazopyridine (PYRIDIUM) 200 MG tablet Take 1 tablet (200 mg total) by mouth Three (3) times a day as needed for pain for up to 2 days. 6 tablet 0  . pioglitazone (ACTOS) 30 MG tablet Take 1 tablet (30 mg total) by mouth daily.    . potassium chloride 10 MEQ ER tablet Take 1 tablet (10 mEq total) by mouth daily.    . rosuvastatin (CRESTOR) 40 MG tablet     . semaglutide (OZEMPIC) 2 mg/dose (8 mg/3 mL) PnIj Inject 2 mg under the skin. Taking on Friday    . traZODone  (DESYREL ) 50 MG tablet     . venlafaxine (EFFEXOR) 75 MG tablet Take 1 tablet (75 mg total) by mouth.     No current facility-administered medications on file prior to visit.  [3] Allergies Allergen Reactions  . Atorvastatin Other (See Comments)    Other Reaction(s): Other (See Comments)  Cramps  . Naproxen Sodium Other (See Comments)    Other Reaction(s): Other (See Comments)  Head and Neck Puritis  . Ibuprofen  Itching  [4] No family history on file.

## 2024-08-24 NOTE — Progress Notes (Signed)
-------------------------------------------------------------------------------   Summary: Navigation Introduction -------------------------------------------------------------------------------  Introduced myself to Ms. Watrous and family as NN-role explained. Plan reviewed for tumor board discussion-followed by follow up from team regarding recommendation for either option 1: NAC or option 2: surgery then adjuvant therapy.   Contact Information given. Education provided regarding use of MyChart for non urgent needs vs use of [phone] for urgent/same day requests (for pain, N/V, same day refills, etc). Labs drawn in clinic. Healthcare documentation (advance directives) uploaded to media tab and original copies returned to Ms. Sandiford's daughter.    No immediate questions/concerns at this time.  Delon Lunger, RN, BSN Oncology Nurse Navigator

## 2024-08-25 ENCOUNTER — Telehealth: Payer: Self-pay | Admitting: Oncology

## 2024-08-25 NOTE — Telephone Encounter (Signed)
 Returned Call to Ms. Pines after receiving call from the communication center in reference to a text that she has an appt on 1/15 with Dr. Lennard. She says she is confused as to what is going on in regards to her treatment plan. She was expecting to be updated on the plan via a phone call but now it appears that she will be having surgery in January.  She is asking for someone to call her to update.     Triage Recommendations:   I advised that I would reach out to NN to let them know she is requesting an update on the plan.  Advised that this request may take up to 24-48 hours depending on whether or not the team is in clinic.    Caller's Response:   She  was  appreciative of the call and is agreeable to this plan and will call back with any further questions/concerns    Outstanding tasks: Care team notified

## 2024-08-25 NOTE — Telephone Encounter (Signed)
 Copied from CRM #[id number]. Topic: Care Management - Discuss Care Plan >> Aug 25, 2024 12:21 PM Laymon ORN wrote:   Pricilla, Moehle contacted the Communication Center requesting to speak with the care team of Jennifer Maddox to discuss:  Requesting return call from Delon Lunger  Please contact [place] at [phone].  Thank you,  Laymon JONETTA [facility]  [phone]

## 2024-08-25 NOTE — Telephone Encounter (Signed)
-------------------------------------------------------------------------------   Summary: Navigation Note -------------------------------------------------------------------------------  Received message from triage-call placed to Jennifer Maddox to let her know that Dr. Rumalda had attempted to call earlier, but it went straight to VM/she was unable to reach. Reviewed recommendation for NAC followed by surgery with Dr. Lennard. Jennifer Maddox had some follow up questions and requested to speak to Dr. Rumalda again, before deciding. She apologized-she had a couple questions she forgot to ask in clinic yesterday.    Dr. Rumalda spoke with Jennifer Maddox regarding plan for NAC and then surgery. Jennifer Maddox is in agreement. NN to set up echo, audiology appointment, and local oncology referral.   Delon Lunger RN, BSN Oncology Nurse Navigator

## 2024-08-25 NOTE — Telephone Encounter (Signed)
 Spoke with the patient and confirmed surgery date of 12/15/2024 with Dr. Lennard and also postop return call scheduled for 12/28/2024. She just wanted to be clear on the upcoming plan.  She had also said she spoke with Dr. Rumalda.  She was appreciative of the call.

## 2024-08-25 NOTE — Telephone Encounter (Signed)
 Vm left with patient in regard to communication sent to Dr. Melanee from [facility]. Requested patient call back to clarify if she would like to transfer care here to the [facility], direct extension left.

## 2024-08-28 ENCOUNTER — Other Ambulatory Visit: Payer: Self-pay | Admitting: Internal Medicine

## 2024-08-28 DIAGNOSIS — C669 Malignant neoplasm of unspecified ureter: Secondary | ICD-10-CM

## 2024-09-01 ENCOUNTER — Inpatient Hospital Stay: Payer: Self-pay | Attending: Oncology | Admitting: Oncology

## 2024-09-01 ENCOUNTER — Encounter: Payer: Self-pay | Admitting: Oncology

## 2024-09-01 ENCOUNTER — Other Ambulatory Visit (HOSPITAL_COMMUNITY): Payer: Self-pay | Admitting: Student

## 2024-09-01 ENCOUNTER — Inpatient Hospital Stay: Payer: Self-pay

## 2024-09-01 ENCOUNTER — Telehealth: Payer: Self-pay

## 2024-09-01 VITALS — BP 143/56 | HR 69 | Temp 96.1°F | Resp 18 | Ht 65.0 in | Wt 203.9 lb

## 2024-09-01 DIAGNOSIS — Z5111 Encounter for antineoplastic chemotherapy: Secondary | ICD-10-CM | POA: Insufficient documentation

## 2024-09-01 DIAGNOSIS — Z9012 Acquired absence of left breast and nipple: Secondary | ICD-10-CM | POA: Diagnosis not present

## 2024-09-01 DIAGNOSIS — Z9221 Personal history of antineoplastic chemotherapy: Secondary | ICD-10-CM | POA: Diagnosis not present

## 2024-09-01 DIAGNOSIS — Z08 Encounter for follow-up examination after completed treatment for malignant neoplasm: Secondary | ICD-10-CM | POA: Diagnosis not present

## 2024-09-01 DIAGNOSIS — Z923 Personal history of irradiation: Secondary | ICD-10-CM | POA: Insufficient documentation

## 2024-09-01 DIAGNOSIS — C679 Malignant neoplasm of bladder, unspecified: Secondary | ICD-10-CM | POA: Insufficient documentation

## 2024-09-01 DIAGNOSIS — Z7189 Other specified counseling: Secondary | ICD-10-CM

## 2024-09-01 DIAGNOSIS — Z853 Personal history of malignant neoplasm of breast: Secondary | ICD-10-CM | POA: Insufficient documentation

## 2024-09-01 DIAGNOSIS — Z87891 Personal history of nicotine dependence: Secondary | ICD-10-CM | POA: Insufficient documentation

## 2024-09-01 DIAGNOSIS — C689 Malignant neoplasm of urinary organ, unspecified: Secondary | ICD-10-CM | POA: Diagnosis not present

## 2024-09-01 MED ORDER — ONDANSETRON HCL 8 MG PO TABS: 8.0000 mg | ORAL_TABLET | Freq: Three times a day (TID) | ORAL | 1 refills | Status: AC | PRN

## 2024-09-01 MED ORDER — LIDOCAINE-PRILOCAINE 2.5-2.5 % EX CREA: TOPICAL_CREAM | CUTANEOUS | 3 refills | Status: AC

## 2024-09-01 MED ORDER — DEXAMETHASONE 4 MG PO TABS: ORAL_TABLET | ORAL | 1 refills | Status: AC

## 2024-09-01 MED ORDER — PROCHLORPERAZINE MALEATE 10 MG PO TABS: 10.0000 mg | ORAL_TABLET | Freq: Four times a day (QID) | ORAL | 1 refills | Status: AC | PRN

## 2024-09-01 NOTE — Progress Notes (Signed)
 Patient for IR Port Insertion on Monday 09/04/24, I called and spoke with the patient on the phone and gave pre-procedure instructions. Pt was made aware to be here at 1p, NPO after MN prior to procedure as well as driver post procedure/recovery/discharge. Pt stated understanding.  Called  09/01/24

## 2024-09-01 NOTE — Telephone Encounter (Signed)
 Received secure chat indicating patient returned call. Outbound call; spoke to patient who agreed to port insertion Mon 9/22 at 2p an arrive at 1p.  Reviewed NPO 8 hrs prior and would need a driver present d/t moderate sedation; [facility].  Patient verbalized understanding.

## 2024-09-01 NOTE — Telephone Encounter (Signed)
 Patient needs port placement prior to first treatment scheduled Fri 9/26.  Per Clarita I had an opening Friday morning but since she is having her infusion on Friday all I have is early afternoons - I have Mon 9/22 at 2p an arrive at 1p or Tues 9/23 at 1p an arrive at 12p or Thurs at 2:30p an arrive at 1:30p. Would any of these work?.  Voice message left.

## 2024-09-01 NOTE — Progress Notes (Signed)
 Patient is a new patient, referred for Urothelial carcinoma of distal ureter. Previous breast cancer patient.

## 2024-09-01 NOTE — Progress Notes (Signed)
 Hematology/Oncology Consult note [facility]  [phone] [phone]  Patient Care Team: Valora Agent, MD as PCP - General (Family Medicine)   Name of the patient: Jennifer Maddox  [id number]  1946-03-13   Date of visit: 09/01/24  Diagnosis-history of breast cancer now with newly diagnosed urothelial cancer  Chief complaint/ Reason for visit-discuss biopsy results and further management  Heme/Onc history: Patient is a 78 year old female who was diagnosed with breast cancer both in 2011 right breast cancer and 2019 left breast cancer stage I ER/PR positive HER2 negative.  Most recently patient completed 5 years of Arimidex  in March 2025 after left mastectomy.  She did not require postmastectomy radiation on the left side.  Patient was found to have hematuria and was seen by urology Dr. Twylla.  She underwent CT abdomen pelvis with contrast in June 2025 which showed a 6 mm ureteral stone and associated moderate right hydronephrosis.  She underwent right ureteral stone extraction and at that time was noted to have abnormal appearing ureteral mucosa proximal to the stone.  This was biopsied and was consistent with high-grade papillary urothelial carcinoma.  She also underwent ureteroscopy on 07/31/2024 which showed a 5 cm area of right ureteral narrowing and was unable to be traversed with the ureteroscope.  CT chest without contrast in July 2025 did not show any evidence of distant metastatic disease.  She was evaluated by [facility] urology as well as medical oncology.  Neoadjuvant gemcitabine and cisplatin chemotherapy was recommended for suspected T3 disease for 4 cycles followed by consideration for right nephroureterectomy.  Interval history-patient currently denies any hematuria.  She is doing relatively well for her age.  She will be undergoing audiology evaluation at [facility] soon  ECOG PS- 1 Pain scale- 0   Review of systems- Review of Systems   Constitutional:  Positive for malaise/fatigue. Negative for chills, fever and weight loss.  HENT:  Negative for congestion, ear discharge and nosebleeds.   Eyes:  Negative for blurred vision.  Respiratory:  Negative for cough, hemoptysis, sputum production, shortness of breath and wheezing.   Cardiovascular:  Negative for chest pain, palpitations, orthopnea and claudication.  Gastrointestinal:  Negative for abdominal pain, blood in stool, constipation, diarrhea, heartburn, melena, nausea and vomiting.  Genitourinary:  Negative for dysuria, flank pain, frequency, hematuria and urgency.  Musculoskeletal:  Negative for back pain, joint pain and myalgias.  Skin:  Negative for rash.  Neurological:  Negative for dizziness, tingling, focal weakness, seizures, weakness and headaches.  Endo/Heme/Allergies:  Does not bruise/bleed easily.  Psychiatric/Behavioral:  Negative for depression and suicidal ideas. The patient does not have insomnia.       Allergies  Allergen Reactions   Atorvastatin Other (See Comments)    Cramps   Naproxen Sodium Other (See Comments)    Head and Neck Puritis     Past Medical History:  Diagnosis Date   Adenoma of left adrenal gland    Anemia    Aortic atherosclerosis (HCC)    Aortic valve stenosis    Arthritis    Bilateral renal cysts    Breast cancer, left (HCC)    Colon polyps    COPD (chronic obstructive pulmonary disease) (HCC)    DDD (degenerative disc disease), thoracolumbar    Degenerative disc disease, lumbar    Diverticulosis    DM (diabetes mellitus), type 2 (HCC)    Dyspnea    History of kidney stones    Hyperlipidemia    Hypertension  Insomnia    a.) uses melatonin + trazodone  PRN   Irritable bowel syndrome with both constipation and diarrhea    Long-term use of aspirin therapy    NAFLD (nonalcoholic fatty liver disease)    Obesity    OSA on CPAP    Peripheral edema    Personal history of radiation therapy    Pulmonary hypertension  (HCC)    Right inguinal hernia    TIA (transient ischemic attack) 06/2007     Past Surgical History:  Procedure Laterality Date   ABDOMINAL HYSTERECTOMY     around age 19 ; ovaries intact   BREAST BIOPSY Left 12/01/2018   Pathology LEFT breast mass 11 o'clock location: Invasive mammary   BREAST EXCISIONAL BIOPSY Left 2011   breast ca with radation   CHOLECYSTECTOMY     COLONOSCOPY W/ POLYPECTOMY     COLONOSCOPY WITH PROPOFOL  N/A 10/29/2017   Procedure: COLONOSCOPY WITH PROPOFOL ;  Surgeon: Viktoria Lamar DASEN, MD;  Location: [facility] ENDOSCOPY;  Service: Endoscopy;  Laterality: N/A;   COLONOSCOPY WITH PROPOFOL  N/A 07/24/2022   Procedure: COLONOSCOPY WITH PROPOFOL ;  Surgeon: Maryruth Ole DASEN, MD;  Location: [facility] ENDOSCOPY;  Service: Endoscopy;  Laterality: N/A;   CYSTOSCOPY/URETEROSCOPY/HOLMIUM LASER/STENT PLACEMENT Right 06/06/2024   Procedure: CYSTOSCOPY/URETEROSCOPY/HOLMIUM LASER/STENT PLACEMENT;  Surgeon: Twylla Glendia BROCKS, MD;  Location: [facility] ORS;  Service: Urology;  Laterality: Right;   ESOPHAGOGASTRODUODENOSCOPY (EGD) WITH PROPOFOL  N/A 10/29/2017   Procedure: ESOPHAGOGASTRODUODENOSCOPY (EGD) WITH PROPOFOL ;  Surgeon: Viktoria Lamar DASEN, MD;  Location: [facility] ENDOSCOPY;  Service: Endoscopy;  Laterality: N/A;   KNEE ARTHROSCOPY W/ MENISCAL REPAIR Right    ROTATOR CUFF REPAIR Right 12/2023   SENTINEL NODE BIOPSY Left 01/17/2019   Procedure: SENTINEL NODE INJECTION;  Surgeon: Tye Millet, DO;  Location: [facility] ORS;  Service: General;  Laterality: Left;   TONSILLECTOMY     TOTAL MASTECTOMY Left 01/17/2019   Procedure: TOTAL MASTECTOMY;  Surgeon: Tye Millet, DO;  Location: [facility] ORS;  Service: General;  Laterality: Left;   URETERAL BIOPSY Right 06/06/2024   Procedure: BIOPSY, URETER;  Surgeon: Twylla Glendia BROCKS, MD;  Location: [facility] ORS;  Service: Urology;  Laterality: Right;    Social History   Socioeconomic History   Marital status: Married    Spouse name: Not on file   Number of  children: Not on file   Years of education: Not on file   Highest education level: Not on file  Occupational History   Not on file  Tobacco Use   Smoking status: Former    Current packs/day: 0.00    Types: Cigarettes    Quit date: 12/14/1977    Years since quitting: 46.7   Smokeless tobacco: Never  Vaping Use   Vaping status: Never Used  Substance and Sexual Activity   Alcohol use: No   Drug use: No   Sexual activity: Not on file  Other Topics Concern   Not on file  Social History Narrative   Not on file   Social Drivers of Health   Financial Resource Strain: Medium Risk (08/08/2024)   Received [facility]   Overall Financial Resource Strain (CARDIA)    How hard is it for you to pay for the very basics like food, housing, medical care, and heating?: Somewhat hard  Food Insecurity: No Food Insecurity (08/08/2024)   Received from [facility]   Hunger Vital Sign    Within the past 12 months, you worried that your food would run out before you got the money  to buy more.: Never true    Within the past 12 months, the food you bought just didn't last and you didn't have money to get more.: Never true  Transportation Needs: No Transportation Needs (08/08/2024)   Received from [facility]    Lack of Transportation (Medical): No    Lack of Transportation (Non-Medical): No  Physical Activity: Not on file  Stress: Not on file  Social Connections: Not on file  Intimate Partner Violence: Not At Risk (06/26/2024)   Received from [facility]   Humiliation, Afraid, Rape, and Kick questionnaire    Within the last year, have you been afraid of your partner or ex-partner?: No    Within the last year, have you been humiliated or emotionally abused in other ways by your partner or ex-partner?: No    Within the last year, have you been kicked, hit, slapped, or otherwise physically hurt by your partner or ex-partner?: No    Within the last year, have  you been raped or forced to have any kind of sexual activity by your partner or ex-partner?: No    Family History  Problem Relation Age of Onset   Lung cancer Mother        smoker; deceased 51   Heart attack Father        deceased 64   Melanoma Brother 7       currently 87   Prostate cancer Brother 66       currently 52   Melanoma Son 45       below left armpit; currently 49   Breast cancer Neg Hx      Current Outpatient Medications:    amLODipine (NORVASC) 2.5 MG tablet, Take 2.5 mg by mouth., Disp: , Rfl:    blood glucose meter kit and supplies, USE TWICE DAILY, Disp: , Rfl:    cyanocobalamin  (VITAMIN B12) 1000 MCG tablet, Take 2,000 mcg by mouth daily., Disp: , Rfl:    docusate sodium  (COLACE) 100 MG capsule, Take 100 mg by mouth daily., Disp: , Rfl:    furosemide  (LASIX ) 20 MG tablet, Take 20 mg by mouth daily., Disp: , Rfl:    gabapentin  (NEURONTIN ) 300 MG capsule, Take 300 mg by mouth at bedtime. , Disp: , Rfl:    glucose blood test strip, , Disp: , Rfl:    MELATONIN PO, Take 12 mg by mouth at bedtime. , Disp: , Rfl:    Methylcellulose, Laxative, (CITRUCEL PO), Take 2 tablets by mouth daily., Disp: , Rfl:    Multiple Vitamins-Minerals (MULTIVITAMIN ADULT PO), Take 1 tablet by mouth daily. , Disp: , Rfl:    OZEMPIC, 2 MG/DOSE, 8 MG/3ML SOPN, Inject 1 mg into the skin once a week., Disp: , Rfl:    pioglitazone (ACTOS) 30 MG tablet, Take 30 mg by mouth daily., Disp: , Rfl:    potassium chloride (KLOR-CON M10) 10 MEQ tablet, Take 1 tablet by mouth daily., Disp: , Rfl:    rosuvastatin (CRESTOR) 40 MG tablet, Take 40 mg by mouth at bedtime., Disp: , Rfl:    traZODone  (DESYREL ) 50 MG tablet, Take 50 mg by mouth at bedtime., Disp: , Rfl:    trospium  (SANCTURA ) 20 MG tablet, Take 1 tablet (20 mg total) by mouth 2 (two) times daily as needed (frequency,urgency,bladder spasm)., Disp: 20 tablet, Rfl: 0   TRUEplus Lancets 28G MISC, TEST BLOOD SUGAR AS DIRECTED, Disp: , Rfl:    aspirin  EC 81 MG  tablet, Take 81 mg by mouth daily. Swallow whole. (Patient not taking: Reported on 09/01/2024), Disp: , Rfl:    HYDROcodone -acetaminophen  (NORCO/VICODIN) 5-325 MG tablet, Take 0.5-1 tablets by mouth every 6 (six) hours as needed for moderate pain (pain score 4-6). (Patient not taking: Reported on 09/01/2024), Disp: 8 tablet, Rfl: 0  Physical exam:  Vitals:   09/01/24 0900  BP: (!) 143/56  Pulse: 69  Resp: 18  Temp: (!) 96.1 F (35.6 C)  TempSrc: Tympanic  SpO2: 100%  Weight: 203 lb 14.4 oz (92.5 kg)  Height: 5' 5 (1.651 m)   Physical Exam Cardiovascular:     Rate and Rhythm: Normal rate and regular rhythm.     Heart sounds: Normal heart sounds.  Pulmonary:     Effort: Pulmonary effort is normal.     Breath sounds: Normal breath sounds.  Abdominal:     General: Bowel sounds are normal.     Palpations: Abdomen is soft.  Skin:    General: Skin is warm and dry.  Neurological:     Mental Status: She is alert and oriented to person, place, and time.      I have personally reviewed labs listed below:    Latest Ref Rng & Units 05/20/2024    7:45 PM  CMP  Glucose 70 - 99 mg/dL 893   BUN 8 - 23 mg/dL 19   Creatinine 9.55 - 1.00 mg/dL 8.62   Sodium 864 - 854 mmol/L 136   Potassium 3.5 - 5.1 mmol/L 3.8   Chloride 98 - 111 mmol/L 99   CO2 22 - 32 mmol/L 28   Calcium 8.9 - 10.3 mg/dL 8.7   Total Protein 6.5 - 8.1 g/dL 6.8   Total Bilirubin 0.0 - 1.2 mg/dL 0.9   Alkaline Phos 38 - 126 U/L 66   AST 15 - 41 U/L 17   ALT 0 - 44 U/L 11       Latest Ref Rng & Units 05/20/2024    7:45 PM  CBC  WBC 4.0 - 10.5 K/uL 7.7   Hemoglobin 12.0 - 15.0 g/dL 88.4   Hematocrit 63.9 - 46.0 % 36.8   Platelets 150 - 400 K/uL 191       Assessment and plan- Patient is a 78 y.o. female with prior history of bilateral breast cancer now with newly diagnosed urothelial carcinoma T3 N0 here to discuss further management  I have reviewed CT Abdomen And pelvis images from June 2025  independently.  I have also reviewed urology and medical oncology recommendations at [facility].  Patient was found to have upper urothelial tract high-grade carcinoma T3 N0 disease after she was found to have hematuria in June 2025.  At that time there was no evidence of distant metastatic disease.  Consideration is for right nephroureterectomy in the future but prior to that neoadjuvant chemotherapy was recommended.  Discussed differences between kidney cancer and bladder cancer and that upper urothelial tract carcinoma will be treated similar to bladder cancer.  Although there is no high level of evidence for neoadjuvant chemotherapy in upper urothelial tract carcinoma we will be treating this similar to bladder cancer.  I would recommend gemcitabine chemotherapy at 1000 mg/m along with cisplatin 35 mg/m on day 1 and day 8 every 21-day cycle for 4 cycles.  We discussed risks and benefits of chemotherapy including all but not limited to possible nausea vomiting and diarrhea, low blood counts risk of infections and hospitalization.  Risk of peripheral neuropathy AKI and  hearing loss associated with cisplatin.  Treatment will be given with a curative intent.  Patient understands and agrees to proceed as planned.  Patient has some baseline leukopenia/neutropenia and we will plan for growth factor support with Udenyca on day 11 of every cycle  I am also getting a repeat CT abdomen and pelvis with contrast at this time given that it has been 3 months since her last CT.  We will plan for port placement and chemo teach and she will tentatively start chemotherapy next week.  Patient is getting baseline audiology exam and echocardiogram at [facility].   Visit Diagnosis 1. Urothelial cancer (HCC)   2. Encounter for follow-up surveillance of breast cancer   3. Goals of care, counseling/discussion      Dr. Annah Skene, MD, MPH [facility] [phone] 09/01/2024 12:29 PM

## 2024-09-02 ENCOUNTER — Other Ambulatory Visit: Payer: Self-pay

## 2024-09-03 NOTE — H&P (Signed)
 Chief Complaint: Patient was seen in consultation today for high-grade papillary upper urothelial tract carcinoma recommended for chemotherapy, and with consideration for Port-A-Cath placement.  Referring Provider(s): Dr. Annah Skene, MD  Supervising Physician: Vanice Revel  Patient Status: [facility] - Out-pt  Patient is Full Code  History of Present Illness: HETHER Maddox is a 78 y.o. female  with PMHx notable for right ureteral urothelial carcinoma T3 N0, distant history of bilateral breast cancer, HTN, HLD, TIA, pulmonary hypertension, OSA, DM, COPD, aortic valve stenosis, nephrolithiasis, NAFLD, obesity, and others as delineated below.  Per Dr. Darold progress note dated 9/19: [...]Patient was found to have upper urothelial tract high-grade carcinoma T3 N0 disease after she was found to have hematuria in June 2025.  At that time there was no evidence of distant metastatic disease.  Consideration is for right nephroureterectomy in the future but prior to that neoadjuvant chemotherapy was recommended.  [...]  Treatment will be given with a curative intent.  Patient understands and agrees to proceed as planned.     [...] I am also getting a repeat CT abdomen and pelvis with contrast at this time given that it has been 3 months since her last CT.  We will plan for port placement and chemo teach and she will tentatively start chemotherapy next week.  Patient is getting baseline audiology exam and echocardiogram at [facility].  Interventional Radiology was requested for Port-A-Cath placement. Patient is scheduled for same in IR today.   Patient is alert and laying in bed, calm. Grand daughter is at the bedside. Patient is currently without any significant complaints. Patient denies any fevers, headache, chest pain, SOB, cough, abdominal pain, nausea, vomiting or bleeding.    Past Medical History:  Diagnosis Date   Adenoma of left adrenal gland    Anemia    Aortic atherosclerosis     Aortic valve stenosis    Arthritis    Bilateral renal cysts    Breast cancer, left (HCC)    Colon polyps    COPD (chronic obstructive pulmonary disease) (HCC)    DDD (degenerative disc disease), thoracolumbar    Degenerative disc disease, lumbar    Diverticulosis    DM (diabetes mellitus), type 2 (HCC)    Dyspnea    History of kidney stones    Hyperlipidemia    Hypertension    Insomnia    a.) uses melatonin + trazodone  PRN   Irritable bowel syndrome with both constipation and diarrhea    Long-term use of aspirin therapy    NAFLD (nonalcoholic fatty liver disease)    Obesity    OSA on CPAP    Peripheral edema    Personal history of radiation therapy    Pulmonary hypertension (HCC)    Right inguinal hernia    TIA (transient ischemic attack) 06/2007    Past Surgical History:  Procedure Laterality Date   ABDOMINAL HYSTERECTOMY     around age 37 ; ovaries intact   BREAST BIOPSY Left 12/01/2018   Pathology LEFT breast mass 11 o'clock location: Invasive mammary   BREAST EXCISIONAL BIOPSY Left 2011   breast ca with radation   CHOLECYSTECTOMY     COLONOSCOPY W/ POLYPECTOMY     COLONOSCOPY WITH PROPOFOL  N/A 10/29/2017   Procedure: COLONOSCOPY WITH PROPOFOL ;  Surgeon: Viktoria Lamar DASEN, MD;  Location: [facility] ENDOSCOPY;  Service: Endoscopy;  Laterality: N/A;   COLONOSCOPY WITH PROPOFOL  N/A 07/24/2022   Procedure: COLONOSCOPY WITH PROPOFOL ;  Surgeon: Maryruth Ole DASEN, MD;  Location: [facility] ENDOSCOPY;  Service: Endoscopy;  Laterality: N/A;   CYSTOSCOPY/URETEROSCOPY/HOLMIUM LASER/STENT PLACEMENT Right 06/06/2024   Procedure: CYSTOSCOPY/URETEROSCOPY/HOLMIUM LASER/STENT PLACEMENT;  Surgeon: Twylla Glendia BROCKS, MD;  Location: [facility] ORS;  Service: Urology;  Laterality: Right;   ESOPHAGOGASTRODUODENOSCOPY (EGD) WITH PROPOFOL  N/A 10/29/2017   Procedure: ESOPHAGOGASTRODUODENOSCOPY (EGD) WITH PROPOFOL ;  Surgeon: Viktoria Lamar DASEN, MD;  Location[facility] ENDOSCOPY;  Service: Endoscopy;  Laterality: N/A;    KNEE ARTHROSCOPY W/ MENISCAL REPAIR Right    ROTATOR CUFF REPAIR Right 12/2023   SENTINEL NODE BIOPSY Left 01/17/2019   Procedure: SENTINEL NODE INJECTION;  Surgeon: Tye Millet, DO;  Location: [facility] ORS;  Service: General;  Laterality: Left;   TONSILLECTOMY     TOTAL MASTECTOMY Left 01/17/2019   Procedure: TOTAL MASTECTOMY;  Surgeon: Tye Millet, DO;  Location: [facility] ORS;  Service: General;  Laterality: Left;   URETERAL BIOPSY Right 06/06/2024   Procedure: BIOPSY, URETER;  Surgeon: Twylla Glendia BROCKS, MD;  Location: [facility] ORS;  Service: Urology;  Laterality: Right;    Allergies: Atorvastatin and Naproxen sodium  Medications: Prior to Admission medications   Medication Sig Start Date End Date Taking? Authorizing Provider  amLODipine (NORVASC) 2.5 MG tablet Take 2.5 mg by mouth. 06/19/24 06/19/25  [provider]  aspirin EC 81 MG tablet Take 81 mg by mouth daily. Swallow whole. Patient not taking: Reported on 09/01/2024    [provider]  blood glucose meter kit and supplies USE TWICE DAILY 09/27/15   [provider]  cyanocobalamin  (VITAMIN B12) 1000 MCG tablet Take 2,000 mcg by mouth daily.    [provider]  dexamethasone  (DECADRON ) 4 MG tablet Take 2 tablets (8 mg) by mouth daily x 3 days starting the day after cisplatin  chemotherapy. Take with food. 09/01/24   Melanee Annah BROCKS, MD  docusate sodium  (COLACE) 100 MG capsule Take 100 mg by mouth daily.    [provider]  furosemide  (LASIX ) 20 MG tablet Take 20 mg by mouth daily. 04/22/15   [provider]  gabapentin  (NEURONTIN ) 300 MG capsule Take 300 mg by mouth at bedtime.  08/07/15   [provider]  glucose blood test strip  06/14/14   [provider]  HYDROcodone -acetaminophen  (NORCO/VICODIN) 5-325 MG tablet Take 0.5-1 tablets by mouth every 6 (six) hours as needed for moderate pain (pain score 4-6). Patient not taking: Reported on 09/01/2024 06/06/24   Twylla Glendia BROCKS, MD   lidocaine -prilocaine  (EMLA ) cream Apply to affected area once 09/01/24   Rao, Archana C, MD  MELATONIN PO Take 12 mg by mouth at bedtime.     [provider]  Methylcellulose, Laxative, (CITRUCEL PO) Take 2 tablets by mouth daily.    [provider]  Multiple Vitamins-Minerals (MULTIVITAMIN ADULT PO) Take 1 tablet by mouth daily.     [provider]  ondansetron  (ZOFRAN ) 8 MG tablet Take 1 tablet (8 mg total) by mouth every 8 (eight) hours as needed for nausea or vomiting. Start on the third day after cisplatin . 09/01/24   Melanee Annah BROCKS, MD  OZEMPIC, 2 MG/DOSE, 8 MG/3ML SOPN Inject 1 mg into the skin once a week. 05/16/24   [provider]  pioglitazone (ACTOS) 30 MG tablet Take 30 mg by mouth daily. 08/01/21   [provider]  potassium chloride  (KLOR-CON  M10) 10 MEQ tablet Take 1 tablet by mouth daily. 03/16/22   [provider]  prochlorperazine  (COMPAZINE ) 10 MG tablet Take 1 tablet (10 mg total) by mouth every 6 (six) hours as  needed (Nausea or vomiting). 09/01/24   Rao, Archana C, MD  rosuvastatin (CRESTOR) 40 MG tablet Take 40 mg by mouth at bedtime.    [provider]  traZODone  (DESYREL ) 50 MG tablet Take 50 mg by mouth at bedtime. 08/11/22   [provider]  trospium  (SANCTURA ) 20 MG tablet Take 1 tablet (20 mg total) by mouth 2 (two) times daily as needed (frequency,urgency,bladder spasm). 06/06/24   Stoioff, Glendia BROCKS, MD  TRUEplus Lancets 28G MISC TEST BLOOD SUGAR AS DIRECTED 09/14/22   [provider]     Family History  Problem Relation Age of Onset   Lung cancer Mother        smoker; deceased 64   Heart attack Father        deceased 24   Melanoma Brother 37       currently 25   Prostate cancer Brother 88       currently 79   Melanoma Son 45       below left armpit; currently 59   Breast cancer Neg Hx     Social History   Socioeconomic History   Marital status: Married    Spouse name: Not on file    Number of children: Not on file   Years of education: Not on file   Highest education level: Not on file  Occupational History   Not on file  Tobacco Use   Smoking status: Former    Current packs/day: 0.00    Types: Cigarettes    Quit date: 12/14/1977    Years since quitting: 46.7   Smokeless tobacco: Never  Vaping Use   Vaping status: Never Used  Substance and Sexual Activity   Alcohol use: No   Drug use: No   Sexual activity: Not on file  Other Topics Concern   Not on file  Social History Narrative   Not on file   Social Drivers of Health   Financial Resource Strain: Medium Risk (08/08/2024)   Received from [facility]   Overall Financial Resource Strain (CARDIA)    How hard is it for you to pay for the very basics like food, housing, medical care, and heating?: Somewhat hard  Food Insecurity: No Food Insecurity (08/08/2024)   Received from [facility]   Hunger Vital Sign    Within the past 12 months, you worried that your food would run out before you got the money to buy more.: Never true    Within the past 12 months, the food you bought just didn't last and you didn't have money to get more.: Never true  Transportation Needs: No Transportation Needs (08/08/2024)   Received from [facility]   PRAPARE - Transportation    Lack of Transportation (Medical): No    Lack of Transportation (Non-Medical): No  Physical Activity: Not on file  Stress: Not on file  Social Connections: Not on file     Review of Systems: A 12 point ROS discussed and pertinent positives are indicated in the HPI above.  All other systems are negative.  Vital Signs: BP (!) 168/64   Pulse 67   Temp (!) 97.5 F (36.4 C) (Temporal)   Resp (!) 23   Ht 5' 5 (1.651 m)   Wt 203 lb (92.1 kg)   SpO2 99%   BMI 33.78 kg/m   Advance Care Plan: The advanced care place/surrogate decision maker was discussed at the time of visit and the patient did not wish to discuss  or was not able to name  a surrogate decision maker or provide an advance care plan.  Physical Exam Constitutional:      General: She is not in acute distress.    Appearance: Normal appearance.  HENT:     Mouth/Throat:     Mouth: Mucous membranes are dry.  Cardiovascular:     Rate and Rhythm: Normal rate and regular rhythm.     Pulses: Normal pulses.     Heart sounds: Murmur heard.  Pulmonary:     Effort: Pulmonary effort is normal.     Breath sounds: Normal breath sounds.  Musculoskeletal:        General: Normal range of motion.     Cervical back: Normal range of motion.  Skin:    General: Skin is warm and dry.  Neurological:     Mental Status: She is alert and oriented to person, place, and time.  Psychiatric:        Mood and Affect: Mood normal.        Behavior: Behavior normal.        Thought Content: Thought content normal.        Judgment: Judgment normal.     Imaging: No results found.  Labs:  CBC: Recent Labs    05/18/24 0016 05/20/24 1945  WBC 5.7 7.7  HGB 12.4 11.5*  HCT 39.1 36.8  PLT 210 191    COAGS: No results for input(s): INR, APTT in the last 8760 hours.  BMP: Recent Labs    05/18/24 0016 05/20/24 1945  NA 140 136  K 3.7 3.8  CL 103 99  CO2 30 28  GLUCOSE 116* 106*  BUN 15 19  CALCIUM 9.6 8.7*  CREATININE 0.96 1.37*  GFRNONAA 60* 39*    LIVER FUNCTION TESTS: Recent Labs    05/18/24 0016 05/20/24 1945  BILITOT 0.9 0.9  AST 18 17  ALT 10 11  ALKPHOS 71 66  PROT 7.0 6.8  ALBUMIN 4.1 3.9    TUMOR MARKERS: No results for input(s): AFPTM, CEA, CA199, CHROMGRNA in the last 8760 hours.  Assessment and Plan: Per Dr. Darold progress note dated 9/19: [...]Patient was found to have upper urothelial tract high-grade carcinoma T3 N0 disease after she was found to have hematuria in June 2025.  At that time there was no evidence of distant metastatic disease.  Consideration is for right nephroureterectomy in the future but prior to that  neoadjuvant chemotherapy was recommended.  [...]  Treatment will be given with a curative intent.  Patient understands and agrees to proceed as planned.     [...] I am also getting a repeat CT abdomen and pelvis with contrast at this time given that it has been 3 months since her last CT.  We will plan for port placement and chemo teach and she will tentatively start chemotherapy next week.  Patient is getting baseline audiology exam and echocardiogram at [facility].  Patient presents for scheduled Port-A-Cath placement in IR today.  Patient has been NPO since midnight.  All labs and medications are within acceptable parameters.  Allergies reviewed: Atorvastatin, naproxen.  Risks and benefits of image guided port-a-catheter placement was discussed with the patient including, but not limited to bleeding, infection, pneumothorax, or fibrin sheath development and need for additional procedures.  All of the patient's questions were answered, patient is agreeable to proceed. Consent signed and in chart.    Thank you for allowing our service to participate in [facility] 's care.  Electronically Signed: Carlin DELENA Griffon, PA-C   09/04/2024, 2:03 PM      I spent a total of 30 Minutes in face to face in clinical consultation, greater than 50% of which was counseling/coordinating care for high-grade papillary upper urothelial tract carcinoma recommended for chemotherapy, and with consideration for Port-A-Cath placement.

## 2024-09-04 ENCOUNTER — Other Ambulatory Visit: Payer: Self-pay

## 2024-09-04 ENCOUNTER — Ambulatory Visit
Admission: RE | Admit: 2024-09-04 | Discharge: 2024-09-04 | Disposition: A | Discharge status: Home / Self Care | Source: Ambulatory Visit | Attending: Oncology | Admitting: Oncology

## 2024-09-04 DIAGNOSIS — Z7984 Long term (current) use of oral hypoglycemic drugs: Secondary | ICD-10-CM | POA: Diagnosis not present

## 2024-09-04 DIAGNOSIS — J449 Chronic obstructive pulmonary disease, unspecified: Secondary | ICD-10-CM | POA: Diagnosis not present

## 2024-09-04 DIAGNOSIS — Z87891 Personal history of nicotine dependence: Secondary | ICD-10-CM | POA: Diagnosis not present

## 2024-09-04 DIAGNOSIS — Z79899 Other long term (current) drug therapy: Secondary | ICD-10-CM | POA: Diagnosis not present

## 2024-09-04 DIAGNOSIS — C679 Malignant neoplasm of bladder, unspecified: Secondary | ICD-10-CM | POA: Diagnosis not present

## 2024-09-04 DIAGNOSIS — E119 Type 2 diabetes mellitus without complications: Secondary | ICD-10-CM | POA: Insufficient documentation

## 2024-09-04 DIAGNOSIS — Z8673 Personal history of transient ischemic attack (TIA), and cerebral infarction without residual deficits: Secondary | ICD-10-CM | POA: Insufficient documentation

## 2024-09-04 DIAGNOSIS — E785 Hyperlipidemia, unspecified: Secondary | ICD-10-CM | POA: Insufficient documentation

## 2024-09-04 DIAGNOSIS — I350 Nonrheumatic aortic (valve) stenosis: Secondary | ICD-10-CM | POA: Diagnosis not present

## 2024-09-04 DIAGNOSIS — K760 Fatty (change of) liver, not elsewhere classified: Secondary | ICD-10-CM | POA: Diagnosis not present

## 2024-09-04 DIAGNOSIS — I10 Essential (primary) hypertension: Secondary | ICD-10-CM | POA: Diagnosis not present

## 2024-09-04 DIAGNOSIS — C661 Malignant neoplasm of right ureter: Secondary | ICD-10-CM | POA: Diagnosis not present

## 2024-09-04 DIAGNOSIS — I272 Pulmonary hypertension, unspecified: Secondary | ICD-10-CM | POA: Insufficient documentation

## 2024-09-04 DIAGNOSIS — Z853 Personal history of malignant neoplasm of breast: Secondary | ICD-10-CM | POA: Diagnosis not present

## 2024-09-04 DIAGNOSIS — G4733 Obstructive sleep apnea (adult) (pediatric): Secondary | ICD-10-CM | POA: Diagnosis not present

## 2024-09-04 DIAGNOSIS — Z08 Encounter for follow-up examination after completed treatment for malignant neoplasm: Secondary | ICD-10-CM

## 2024-09-04 DIAGNOSIS — Z7985 Long-term (current) use of injectable non-insulin antidiabetic drugs: Secondary | ICD-10-CM | POA: Diagnosis not present

## 2024-09-04 HISTORY — PX: IR IMAGING GUIDED PORT INSERTION: IMG5740

## 2024-09-04 LAB — GLUCOSE, CAPILLARY
Glucose-Capillary: 88 mg/dL (ref 70–99)
Glucose-Capillary: 91 mg/dL (ref 70–99)

## 2024-09-04 MED ORDER — LIDOCAINE-EPINEPHRINE 1 %-1:100000 IJ SOLN
INTRAMUSCULAR | Status: AC
  Filled 2024-09-04: qty 1

## 2024-09-04 MED ORDER — MIDAZOLAM HCL 2 MG/2ML IJ SOLN
INTRAMUSCULAR | Status: AC
  Filled 2024-09-04: qty 2

## 2024-09-04 MED ORDER — SODIUM CHLORIDE 0.9 % IV SOLN: INTRAVENOUS | Status: DC

## 2024-09-04 MED ORDER — MIDAZOLAM HCL 2 MG/2ML IJ SOLN
INTRAMUSCULAR | Status: AC | PRN
  Administered 2024-09-04 (×3): .5 mg via INTRAVENOUS
  Administered 2024-09-04: 1 mg via INTRAVENOUS

## 2024-09-04 MED ORDER — LIDOCAINE-EPINEPHRINE 1 %-1:100000 IJ SOLN
17.0000 mL | Freq: Once | INTRAMUSCULAR | Status: AC
  Administered 2024-09-04: 17 mL via INTRADERMAL

## 2024-09-04 MED ORDER — FENTANYL CITRATE (PF) 100 MCG/2ML IJ SOLN
INTRAMUSCULAR | Status: AC
  Filled 2024-09-04: qty 2

## 2024-09-04 MED ORDER — HEPARIN SOD (PORK) LOCK FLUSH 100 UNIT/ML IV SOLN
500.0000 [IU] | Freq: Once | INTRAVENOUS | Status: AC
  Administered 2024-09-04: 500 [IU] via INTRAVENOUS

## 2024-09-04 MED ORDER — HEPARIN SOD (PORK) LOCK FLUSH 100 UNIT/ML IV SOLN
INTRAVENOUS | Status: AC
  Filled 2024-09-04: qty 5

## 2024-09-04 MED ORDER — FENTANYL CITRATE (PF) 100 MCG/2ML IJ SOLN
INTRAMUSCULAR | Status: AC | PRN
  Administered 2024-09-04 (×3): 25 ug via INTRAVENOUS
  Administered 2024-09-04: 50 ug via INTRAVENOUS

## 2024-09-04 NOTE — Progress Notes (Signed)
 Pharmacist Chemotherapy Monitoring - Initial Assessment    Anticipated start date: 09/08/24   The following has been reviewed per standard work regarding the patient's treatment regimen: The patient's diagnosis, treatment plan and drug doses, and organ/hematologic function Lab orders and baseline tests specific to treatment regimen  The treatment plan start date, drug sequencing, and pre-medications Prior authorization status  Patient's documented medication list, including drug-drug interaction screen and prescriptions for anti-emetics and supportive care specific to the treatment regimen The drug concentrations, fluid compatibility, administration routes, and timing of the medications to be used The patient's access for treatment and lifetime cumulative dose history, if applicable  The patient's medication allergies and previous infusion related reactions, if applicable   Changes made to treatment plan:  N/A  Follow up needed:  N/A   Jennifer Maddox, Pharm.D., CPP 09/04/2024@9 :07 AM

## 2024-09-04 NOTE — Procedures (Signed)
Interventional Radiology Procedure Note  Procedure: RT internal jugular POWER PORT    Complications: None  Estimated Blood Loss:  MIN  Findings: TIP SVCRA    M. TREVOR Levaeh Vice, MD    

## 2024-09-05 ENCOUNTER — Inpatient Hospital Stay

## 2024-09-05 NOTE — Progress Notes (Signed)
 [facility] CSW Progress Note  Clinical Social Work introduced self to patient during Patient Education with Raoul Moats, Charity fundraiser.  Provided information regarding CSW role, including counseling, advanced care planning and support group.  Patient stated that she has advance directives and will bring them in to be scanned to her chart.  Answered questions as needed.      Follow Up Plan:  CSW will follow-up with patient by phone     Macario CHRISTELLA Au, LCSW Clinical Social Worker [facility]

## 2024-09-06 ENCOUNTER — Ambulatory Visit
Admission: RE | Admit: 2024-09-06 | Discharge: 2024-09-06 | Disposition: A | Discharge status: Home / Self Care | Payer: Self-pay | Source: Ambulatory Visit | Attending: Internal Medicine | Admitting: Internal Medicine

## 2024-09-06 DIAGNOSIS — C679 Malignant neoplasm of bladder, unspecified: Secondary | ICD-10-CM | POA: Diagnosis not present

## 2024-09-06 DIAGNOSIS — I3481 Nonrheumatic mitral (valve) annulus calcification: Secondary | ICD-10-CM | POA: Insufficient documentation

## 2024-09-06 DIAGNOSIS — I517 Cardiomegaly: Secondary | ICD-10-CM | POA: Diagnosis not present

## 2024-09-06 DIAGNOSIS — C669 Malignant neoplasm of unspecified ureter: Secondary | ICD-10-CM | POA: Insufficient documentation

## 2024-09-06 DIAGNOSIS — I77810 Thoracic aortic ectasia: Secondary | ICD-10-CM | POA: Insufficient documentation

## 2024-09-06 DIAGNOSIS — I350 Nonrheumatic aortic (valve) stenosis: Secondary | ICD-10-CM | POA: Diagnosis not present

## 2024-09-06 LAB — ECHOCARDIOGRAM COMPLETE
AR max vel: 1.2 cm2
AV Area VTI: 1.12 cm2
AV Area mean vel: 1.21 cm2
AV Mean grad: 23 mmHg
AV Peak grad: 41.7 mmHg
Ao pk vel: 3.23 m/s
Area-P 1/2: 2.66 cm2
S' Lateral: 3.4 cm

## 2024-09-06 NOTE — Progress Notes (Signed)
  Echocardiogram 2D Echocardiogram has been performed.  Jennifer Maddox 09/06/2024, 4:27 PM

## 2024-09-07 ENCOUNTER — Ambulatory Visit
Admission: RE | Admit: 2024-09-07 | Discharge: 2024-09-07 | Disposition: A | Discharge status: Home / Self Care | Source: Ambulatory Visit | Attending: Oncology | Admitting: Oncology

## 2024-09-07 ENCOUNTER — Encounter: Payer: Self-pay | Admitting: Oncology

## 2024-09-07 DIAGNOSIS — I700 Atherosclerosis of aorta: Secondary | ICD-10-CM | POA: Diagnosis not present

## 2024-09-07 DIAGNOSIS — Z08 Encounter for follow-up examination after completed treatment for malignant neoplasm: Secondary | ICD-10-CM | POA: Diagnosis not present

## 2024-09-07 DIAGNOSIS — Z853 Personal history of malignant neoplasm of breast: Secondary | ICD-10-CM | POA: Insufficient documentation

## 2024-09-07 DIAGNOSIS — C661 Malignant neoplasm of right ureter: Secondary | ICD-10-CM | POA: Diagnosis not present

## 2024-09-07 DIAGNOSIS — C679 Malignant neoplasm of bladder, unspecified: Secondary | ICD-10-CM | POA: Diagnosis not present

## 2024-09-07 DIAGNOSIS — H903 Sensorineural hearing loss, bilateral: Secondary | ICD-10-CM | POA: Diagnosis not present

## 2024-09-07 DIAGNOSIS — K5730 Diverticulosis of large intestine without perforation or abscess without bleeding: Secondary | ICD-10-CM | POA: Diagnosis not present

## 2024-09-07 MED ORDER — IOHEXOL 300 MG/ML  SOLN
100.0000 mL | Freq: Once | INTRAMUSCULAR | Status: AC | PRN
  Administered 2024-09-07: 100 mL via INTRAVENOUS

## 2024-09-08 ENCOUNTER — Inpatient Hospital Stay: Admitting: Oncology

## 2024-09-08 ENCOUNTER — Inpatient Hospital Stay

## 2024-09-08 ENCOUNTER — Encounter: Payer: Self-pay | Admitting: Oncology

## 2024-09-08 VITALS — BP 132/59 | HR 67 | Temp 97.6°F | Resp 18 | Ht 65.0 in | Wt 204.6 lb

## 2024-09-08 DIAGNOSIS — Z5111 Encounter for antineoplastic chemotherapy: Secondary | ICD-10-CM | POA: Diagnosis not present

## 2024-09-08 DIAGNOSIS — C689 Malignant neoplasm of urinary organ, unspecified: Secondary | ICD-10-CM

## 2024-09-08 LAB — CMP (CANCER CENTER ONLY)
ALT: 9 U/L (ref 0–44)
AST: 21 U/L (ref 15–41)
Albumin: 3.8 g/dL (ref 3.5–5.0)
Alkaline Phosphatase: 68 U/L (ref 38–126)
Anion gap: 8 (ref 5–15)
BUN: 16 mg/dL (ref 8–23)
CO2: 27 mmol/L (ref 22–32)
Calcium: 9.2 mg/dL (ref 8.9–10.3)
Chloride: 102 mmol/L (ref 98–111)
Creatinine: 0.82 mg/dL (ref 0.44–1.00)
GFR, Estimated: >60 mL/min (ref >60)
Glucose, Bld: 137 mg/dL — ABNORMAL HIGH (ref 70–99)
Potassium: 3.4 mmol/L — ABNORMAL LOW (ref 3.5–5.1)
Sodium: 137 mmol/L (ref 135–145)
Total Bilirubin: 0.6 mg/dL (ref 0.0–1.2)
Total Protein: 6.2 g/dL — ABNORMAL LOW (ref 6.5–8.1)

## 2024-09-08 LAB — CBC WITH DIFFERENTIAL (CANCER CENTER ONLY)
Abs Immature Granulocytes: 0.01 K/uL (ref 0.00–0.07)
Basophils Absolute: 0 K/uL (ref 0.0–0.1)
Basophils Relative: 0 %
Eosinophils Absolute: 0.2 K/uL (ref 0.0–0.5)
Eosinophils Relative: 6 %
HCT: 34.1 % — ABNORMAL LOW (ref 36.0–46.0)
Hemoglobin: 11 g/dL — ABNORMAL LOW (ref 12.0–15.0)
Immature Granulocytes: 0 %
Lymphocytes Relative: 24 %
Lymphs Abs: 0.8 K/uL (ref 0.7–4.0)
MCH: 28.8 pg (ref 26.0–34.0)
MCHC: 32.3 g/dL (ref 30.0–36.0)
MCV: 89.3 fL (ref 80.0–100.0)
Monocytes Absolute: 0.4 K/uL (ref 0.1–1.0)
Monocytes Relative: 11 %
Neutro Abs: 1.9 K/uL (ref 1.7–7.7)
Neutrophils Relative %: 59 %
Platelet Count: 205 K/uL (ref 150–400)
RBC: 3.82 MIL/uL — ABNORMAL LOW (ref 3.87–5.11)
RDW: 15.8 % — ABNORMAL HIGH (ref 11.5–15.5)
WBC Count: 3.3 K/uL — ABNORMAL LOW (ref 4.0–10.5)
nRBC: 0 % (ref 0.0–0.2)

## 2024-09-08 LAB — MAGNESIUM: Magnesium: 2 mg/dL (ref 1.7–2.4)

## 2024-09-08 MED ORDER — APREPITANT 130 MG/18ML IV EMUL
130.0000 mg | Freq: Once | INTRAVENOUS | Status: AC
  Administered 2024-09-08: 130 mg via INTRAVENOUS
  Filled 2024-09-08: qty 18

## 2024-09-08 MED ORDER — SODIUM CHLORIDE 0.9 % IV SOLN
1000.0000 mg/m2 | Freq: Once | INTRAVENOUS | Status: AC
  Administered 2024-09-08: 2052 mg via INTRAVENOUS
  Filled 2024-09-08: qty 53.97

## 2024-09-08 MED ORDER — POTASSIUM CHLORIDE IN NACL 20-0.9 MEQ/L-% IV SOLN
Freq: Once | INTRAVENOUS | Status: AC
  Filled 2024-09-08: qty 1000

## 2024-09-08 MED ORDER — SODIUM CHLORIDE 0.9 % IV SOLN
35.0000 mg/m2 | Freq: Once | INTRAVENOUS | Status: AC
  Administered 2024-09-08: 72 mg via INTRAVENOUS
  Filled 2024-09-08: qty 72

## 2024-09-08 MED ORDER — PALONOSETRON HCL INJECTION 0.25 MG/5ML
0.2500 mg | Freq: Once | INTRAVENOUS | Status: AC
  Administered 2024-09-08: 0.25 mg via INTRAVENOUS
  Filled 2024-09-08: qty 5

## 2024-09-08 MED ORDER — SODIUM CHLORIDE 0.9 % IV SOLN
INTRAVENOUS | Status: DC
  Filled 2024-09-08: qty 250

## 2024-09-08 MED ORDER — MAGNESIUM SULFATE 2 GM/50ML IV SOLN
2.0000 g | Freq: Once | INTRAVENOUS | Status: AC
  Administered 2024-09-08: 2 g via INTRAVENOUS
  Filled 2024-09-08: qty 50

## 2024-09-08 MED ORDER — DEXAMETHASONE SODIUM PHOSPHATE 10 MG/ML IJ SOLN
10.0000 mg | Freq: Once | INTRAMUSCULAR | Status: AC
  Administered 2024-09-08: 10 mg via INTRAVENOUS
  Filled 2024-09-08: qty 1

## 2024-09-08 NOTE — Progress Notes (Signed)
 Patient states she's feeling a little nervous, but doing okay.

## 2024-09-08 NOTE — Progress Notes (Signed)
 Hematology/Oncology Consult note [facility]  Telephone:([phone] [phone]  Patient Care Team: Valora Agent, MD as PCP - General (Family Medicine)   Name of the patient: Jennifer Maddox  [id number]  February 11, 1946   Date of visit: 09/08/24  Diagnosis- 1.  Upper urothelial tract high-grade urothelial carcinoma 2.  History of breast cancer  Chief complaint/ Reason for visit-on treatment assessment prior to cycle 1 of neoadjuvant gemcitabine  cisplatin  chemotherapy  Heme/Onc history:  Patient is a 78 year old female who was diagnosed with breast cancer both in 2011 right breast cancer and 2019 left breast cancer stage I ER/PR positive HER2 negative.  Most recently patient completed 5 years of Arimidex  in March 2025 after left mastectomy.  She did not require postmastectomy radiation on the left side.   Patient was found to have hematuria and was seen by urology Dr. Twylla.  She underwent CT abdomen pelvis with contrast in June 2025 which showed a 6 mm ureteral stone and associated moderate right hydronephrosis.  She underwent right ureteral stone extraction and at that time was noted to have abnormal appearing ureteral mucosa proximal to the stone.  This was biopsied and was consistent with high-grade papillary urothelial carcinoma.  She also underwent ureteroscopy on 07/31/2024 which showed a 5 cm area of right ureteral narrowing and was unable to be traversed with the ureteroscope.  CT chest without contrast in July 2025 did not show any evidence of distant metastatic disease.  She was evaluated by [facility] urology as well as medical oncology.  Neoadjuvant gemcitabine  and cisplatin  chemotherapy was recommended for suspected T3 disease for 4 cycles followed by consideration for right nephroureterectomy.  Interval history-patient had baseline audiology evaluation and was deemed to require hearing aids.  She is otherwise doing well and denies any complaints  today  ECOG PS- 1 Pain scale- 0   Review of systems- Review of Systems  Constitutional:  Negative for chills, fever, malaise/fatigue and weight loss.  HENT:  Negative for congestion, ear discharge and nosebleeds.   Eyes:  Negative for blurred vision.  Respiratory:  Negative for cough, hemoptysis, sputum production, shortness of breath and wheezing.   Cardiovascular:  Negative for chest pain, palpitations, orthopnea and claudication.  Gastrointestinal:  Negative for abdominal pain, blood in stool, constipation, diarrhea, heartburn, melena, nausea and vomiting.  Genitourinary:  Negative for dysuria, flank pain, frequency, hematuria and urgency.  Musculoskeletal:  Negative for back pain, joint pain and myalgias.  Skin:  Negative for rash.  Neurological:  Negative for dizziness, tingling, focal weakness, seizures, weakness and headaches.  Endo/Heme/Allergies:  Does not bruise/bleed easily.  Psychiatric/Behavioral:  Negative for depression and suicidal ideas. The patient does not have insomnia.       Allergies  Allergen Reactions   Atorvastatin Other (See Comments)    Cramps   Naproxen Sodium Other (See Comments)    Head and Neck Puritis     Past Medical History:  Diagnosis Date   Adenoma of left adrenal gland    Anemia    Aortic atherosclerosis    Aortic valve stenosis    Arthritis    Bilateral renal cysts    Breast cancer, left (HCC)    Colon polyps    COPD (chronic obstructive pulmonary disease) (HCC)    DDD (degenerative disc disease), thoracolumbar    Degenerative disc disease, lumbar    Diverticulosis    DM (diabetes mellitus), type 2 (HCC)    Dyspnea    History of kidney stones  Hyperlipidemia    Hypertension    Insomnia    a.) uses melatonin + trazodone  PRN   Irritable bowel syndrome with both constipation and diarrhea    Long-term use of aspirin therapy    NAFLD (nonalcoholic fatty liver disease)    Obesity    OSA on CPAP    Peripheral edema    Personal  history of radiation therapy    Pulmonary hypertension (HCC)    Right inguinal hernia    TIA (transient ischemic attack) 06/2007     Past Surgical History:  Procedure Laterality Date   ABDOMINAL HYSTERECTOMY     around age 49 ; ovaries intact   BREAST BIOPSY Left 12/01/2018   Pathology LEFT breast mass 11 o'clock location: Invasive mammary   BREAST EXCISIONAL BIOPSY Left 2011   breast ca with radation   CHOLECYSTECTOMY     COLONOSCOPY W/ POLYPECTOMY     COLONOSCOPY WITH PROPOFOL  N/A 10/29/2017   Procedure: COLONOSCOPY WITH PROPOFOL ;  Surgeon: Viktoria Lamar DASEN, MD;  Location: [facility] ENDOSCOPY;  Service: Endoscopy;  Laterality: N/A;   COLONOSCOPY WITH PROPOFOL  N/A 07/24/2022   Procedure: COLONOSCOPY WITH PROPOFOL ;  Surgeon: Maryruth Ole DASEN, MD;  Location: [facility] ENDOSCOPY;  Service: Endoscopy;  Laterality: N/A;   CYSTOSCOPY/URETEROSCOPY/HOLMIUM LASER/STENT PLACEMENT Right 06/06/2024   Procedure: CYSTOSCOPY/URETEROSCOPY/HOLMIUM LASER/STENT PLACEMENT;  Surgeon: Twylla Glendia BROCKS, MD;  Location: [facility] ORS;  Service: Urology;  Laterality: Right;   ESOPHAGOGASTRODUODENOSCOPY (EGD) WITH PROPOFOL  N/A 10/29/2017   Procedure: ESOPHAGOGASTRODUODENOSCOPY (EGD) WITH PROPOFOL ;  Surgeon: Viktoria Lamar DASEN, MD;  Location: [facility] ENDOSCOPY;  Service: Endoscopy;  Laterality: N/A;   IR IMAGING GUIDED PORT INSERTION  09/04/2024   KNEE ARTHROSCOPY W/ MENISCAL REPAIR Right    ROTATOR CUFF REPAIR Right 12/2023   SENTINEL NODE BIOPSY Left 01/17/2019   Procedure: SENTINEL NODE INJECTION;  Surgeon: Tye Millet, DO;  Location: [facility] ORS;  Service: General;  Laterality: Left;   TONSILLECTOMY     TOTAL MASTECTOMY Left 01/17/2019   Procedure: TOTAL MASTECTOMY;  Surgeon: Tye Millet, DO;  Location: [facility] ORS;  Service: General;  Laterality: Left;   URETERAL BIOPSY Right 06/06/2024   Procedure: BIOPSY, URETER;  Surgeon: Twylla Glendia BROCKS, MD;  Location: [facility] ORS;  Service: Urology;  Laterality: Right;    Social History    Socioeconomic History   Marital status: Married    Spouse name: Not on file   Number of children: Not on file   Years of education: Not on file   Highest education level: Not on file  Occupational History   Not on file  Tobacco Use   Smoking status: Former    Current packs/day: 0.00    Types: Cigarettes    Quit date: 12/14/1977    Years since quitting: 46.7   Smokeless tobacco: Never  Vaping Use   Vaping status: Never Used  Substance and Sexual Activity   Alcohol use: No   Drug use: No   Sexual activity: Not on file  Other Topics Concern   Not on file  Social History Narrative   Not on file   Social Drivers of Health   Financial Resource Strain: Medium Risk (08/08/2024)   Received from [facility]   Overall Financial Resource Strain (CARDIA)    How hard is it for you to pay for the very basics like food, housing, medical care, and heating?: Somewhat hard  Food Insecurity: No Food Insecurity (08/08/2024)   Received from [facility]   Hunger Vital Sign    Within  the past 12 months, you worried that your food would run out before you got the money to buy more.: Never true    Within the past 12 months, the food you bought just didn't last and you didn't have money to get more.: Never true  Transportation Needs: No Transportation Needs (08/08/2024)   Received from [facility] - Transportation    Lack of Transportation (Medical): No    Lack of Transportation (Non-Medical): No  Physical Activity: Not on file  Stress: Not on file  Social Connections: Not on file  Intimate Partner Violence: Not At Risk (06/26/2024)   Received from [facility]   Humiliation, Afraid, Rape, and Kick questionnaire    Within the last year, have you been afraid of your partner or ex-partner?: No    Within the last year, have you been humiliated or emotionally abused in other ways by your partner or ex-partner?: No    Within the last year, have you been kicked, hit, slapped,  or otherwise physically hurt by your partner or ex-partner?: No    Within the last year, have you been raped or forced to have any kind of sexual activity by your partner or ex-partner?: No    Family History  Problem Relation Age of Onset   Lung cancer Mother        smoker; deceased 60   Heart attack Father        deceased 70   Melanoma Brother 31       currently 59   Prostate cancer Brother 54       currently 75   Melanoma Son 45       below left armpit; currently 9   Breast cancer Neg Hx      Current Outpatient Medications:    amLODipine (NORVASC) 2.5 MG tablet, Take 2.5 mg by mouth., Disp: , Rfl:    blood glucose meter kit and supplies, USE TWICE DAILY, Disp: , Rfl:    cyanocobalamin  (VITAMIN B12) 1000 MCG tablet, Take 2,000 mcg by mouth daily., Disp: , Rfl:    dexamethasone  (DECADRON ) 4 MG tablet, Take 2 tablets (8 mg) by mouth daily x 3 days starting the day after cisplatin  chemotherapy. Take with food., Disp: 30 tablet, Rfl: 1   docusate sodium  (COLACE) 100 MG capsule, Take 100 mg by mouth daily., Disp: , Rfl:    furosemide  (LASIX ) 20 MG tablet, Take 20 mg by mouth daily., Disp: , Rfl:    gabapentin  (NEURONTIN ) 300 MG capsule, Take 300 mg by mouth at bedtime. , Disp: , Rfl:    glucose blood test strip, , Disp: , Rfl:    HYDROcodone -acetaminophen  (NORCO/VICODIN) 5-325 MG tablet, Take 0.5-1 tablets by mouth every 6 (six) hours as needed for moderate pain (pain score 4-6)., Disp: 8 tablet, Rfl: 0   lidocaine -prilocaine  (EMLA ) cream, Apply to affected area once, Disp: 30 g, Rfl: 3   magnesium  oxide (MAG-OX) 400 (240 Mg) MG tablet, Take 400 mg by mouth daily., Disp: , Rfl:    MELATONIN PO, Take 12 mg by mouth at bedtime. , Disp: , Rfl:    Methylcellulose, Laxative, (CITRUCEL PO), Take 2 tablets by mouth daily., Disp: , Rfl:    Multiple Vitamins-Minerals (MULTIVITAMIN ADULT PO), Take 1 tablet by mouth daily. , Disp: , Rfl:    ondansetron  (ZOFRAN ) 8 MG tablet, Take 1 tablet (8 mg  total) by mouth every 8 (eight) hours as needed for nausea or vomiting. Start on the third  day after cisplatin ., Disp: 30 tablet, Rfl: 1   OZEMPIC, 2 MG/DOSE, 8 MG/3ML SOPN, Inject 1 mg into the skin once a week., Disp: , Rfl:    pioglitazone (ACTOS) 30 MG tablet, Take 30 mg by mouth daily., Disp: , Rfl:    potassium chloride  (KLOR-CON  M10) 10 MEQ tablet, Take 1 tablet by mouth daily., Disp: , Rfl:    prochlorperazine  (COMPAZINE ) 10 MG tablet, Take 1 tablet (10 mg total) by mouth every 6 (six) hours as needed (Nausea or vomiting)., Disp: 30 tablet, Rfl: 1   rosuvastatin (CRESTOR) 40 MG tablet, Take 40 mg by mouth at bedtime., Disp: , Rfl:    traZODone  (DESYREL ) 50 MG tablet, Take 50 mg by mouth at bedtime., Disp: , Rfl:    trospium  (SANCTURA ) 20 MG tablet, Take 1 tablet (20 mg total) by mouth 2 (two) times daily as needed (frequency,urgency,bladder spasm)., Disp: 20 tablet, Rfl: 0   TRUEplus Lancets 28G MISC, TEST BLOOD SUGAR AS DIRECTED, Disp: , Rfl:    aspirin EC 81 MG tablet, Take 81 mg by mouth daily. Swallow whole. (Patient not taking: Reported on 09/01/2024), Disp: , Rfl:   Physical exam:  Vitals:   09/08/24 0855  BP: (!) 132/59  Pulse: 67  Resp: 18  Temp: 97.6 F (36.4 C)  TempSrc: Tympanic  SpO2: 100%  Weight: 204 lb 9.6 oz (92.8 kg)  Height: 5' 5 (1.651 m)   Physical Exam Cardiovascular:     Rate and Rhythm: Normal rate and regular rhythm.     Heart sounds: Normal heart sounds.     Comments: Right  chest wall  port in place Pulmonary:     Effort: Pulmonary effort is normal.     Breath sounds: Normal breath sounds.  Skin:    General: Skin is warm and dry.  Neurological:     Mental Status: She is alert and oriented to person, place, and time.      I have personally reviewed labs listed below:    Latest Ref Rng & Units 09/08/2024    8:26 AM  CMP  Glucose 70 - 99 mg/dL 862   BUN 8 - 23 mg/dL 16   Creatinine 9.55 - 1.00 mg/dL 9.17   Sodium 864 - 854 mmol/L 137    Potassium 3.5 - 5.1 mmol/L 3.4   Chloride 98 - 111 mmol/L 102   CO2 22 - 32 mmol/L 27   Calcium 8.9 - 10.3 mg/dL 9.2   Total Protein 6.5 - 8.1 g/dL 6.2   Total Bilirubin 0.0 - 1.2 mg/dL 0.6   Alkaline Phos 38 - 126 U/L 68   AST 15 - 41 U/L 21   ALT 0 - 44 U/L 9       Latest Ref Rng & Units 09/08/2024    8:26 AM  CBC  WBC 4.0 - 10.5 K/uL 3.3   Hemoglobin 12.0 - 15.0 g/dL 88.9   Hematocrit 63.9 - 46.0 % 34.1   Platelets 150 - 400 K/uL 205    I have personally reviewed Radiology images listed below: No images are attached to the encounter.  ECHOCARDIOGRAM COMPLETE Result Date: 09/06/2024    ECHOCARDIOGRAM REPORT   Patient Name:   JERSI MCMASTER [facility] Date of Exam: 09/06/2024 Medical Rec #:  [id number]      Height:       65.0 in Accession #:    [id number]     Weight:       203.0 lb Date of Birth:  Feb 15, 1946  BSA:          1.991 m Patient Age:    78 years       BP:           163/70 mmHg Patient Gender: F              HR:           72 bpm. Exam Location:  [facility] Procedure: 2D Echo and Strain Analysis (Both Spectral and Color Flow Doppler            were utilized during procedure). Indications:     urothelial carcinoma  History:         Patient has no prior history of Echocardiogram examinations.  Sonographer:     Tinnie Barefoot RDCS Referring Phys:  [id number] TRACY L ROSE Diagnosing Phys: Keller Paterson  Sonographer Comments: Global longitudinal strain was attempted. IMPRESSIONS  1. Left ventricular ejection fraction, by estimation, is 55 to 60%. The left ventricle has normal function. The left ventricle has no regional wall motion abnormalities. There is mild left ventricular hypertrophy. Left ventricular diastolic parameters are indeterminate.  2. Right ventricular systolic function is normal. The right ventricular size is normal. There is normal pulmonary artery systolic pressure.  3. Left atrial size was mildly dilated.  4. The mitral valve is normal in structure. Trivial mitral valve  regurgitation. Moderate mitral annular calcification.  5. The aortic valve is tricuspid. Aortic valve regurgitation is not visualized. Moderate aortic valve stenosis.  6. Aortic dilatation noted. There is mild dilatation of the ascending aorta, measuring 40 mm.  7. The inferior vena cava is normal in size with greater than 50% respiratory variability, suggesting right atrial pressure of 3 mmHg. FINDINGS  Left Ventricle: Left ventricular ejection fraction, by estimation, is 55 to 60%. The left ventricle has normal function. The left ventricle has no regional wall motion abnormalities. The left ventricular internal cavity size was normal in size. There is  mild left ventricular hypertrophy. Left ventricular diastolic parameters are indeterminate. Right Ventricle: The right ventricular size is normal. No increase in right ventricular wall thickness. Right ventricular systolic function is normal. There is normal pulmonary artery systolic pressure. The tricuspid regurgitant velocity is 2.74 m/s, and  with an assumed right atrial pressure of 3 mmHg, the estimated right ventricular systolic pressure is 33.0 mmHg. Left Atrium: Left atrial size was mildly dilated. Right Atrium: Right atrial size was normal in size. Pericardium: There is no evidence of pericardial effusion. Mitral Valve: The mitral valve is normal in structure. Moderate mitral annular calcification. Trivial mitral valve regurgitation. Tricuspid Valve: The tricuspid valve is normal in structure. Tricuspid valve regurgitation is trivial. Aortic Valve: The aortic valve is tricuspid. Aortic valve regurgitation is not visualized. Moderate aortic stenosis is present. Aortic valve mean gradient measures 23.0 mmHg. Aortic valve peak gradient measures 41.7 mmHg. Aortic valve area, by VTI measures 1.12 cm. Pulmonic Valve: The pulmonic valve was not well visualized. Pulmonic valve regurgitation is trivial. Aorta: The aortic root is normal in size and structure and  aortic dilatation noted. There is mild dilatation of the ascending aorta, measuring 40 mm. Venous: The inferior vena cava is normal in size with greater than 50% respiratory variability, suggesting right atrial pressure of 3 mmHg. IAS/Shunts: The atrial septum is grossly normal.  LEFT VENTRICLE PLAX 2D LVIDd:         5.40 cm   Diastology LVIDs:         3.40 cm   LV e'  medial:    5.98 cm/s LV PW:         1.00 cm   LV E/e' medial:  17.7 LV IVS:        1.10 cm   LV e' lateral:   6.09 cm/s LVOT diam:     2.00 cm   LV E/e' lateral: 17.4 LV SV:         86 LV SV Index:   43 LVOT Area:     3.14 cm  RIGHT VENTRICLE             IVC RV Basal diam:  2.90 cm     IVC diam: 1.40 cm RV S prime:     17.10 cm/s TAPSE (M-mode): 3.2 cm LEFT ATRIUM             Index        RIGHT ATRIUM           Index LA diam:        4.00 cm 2.01 cm/m   RA Area:     16.90 cm LA Vol (A2C):   87.9 ml 44.16 ml/m  RA Volume:   44.00 ml  22.10 ml/m LA Vol (A4C):   67.6 ml 33.96 ml/m LA Biplane Vol: 77.0 ml 38.68 ml/m  AORTIC VALVE                     PULMONIC VALVE AV Area (Vmax):    1.20 cm      PR End Diast Vel: 4.58 msec AV Area (Vmean):   1.21 cm AV Area (VTI):     1.12 cm AV Vmax:           323.00 cm/s AV Vmean:          221.000 cm/s AV VTI:            0.767 m AV Peak Grad:      41.7 mmHg AV Mean Grad:      23.0 mmHg LVOT Vmax:         123.50 cm/s LVOT Vmean:        85.050 cm/s LVOT VTI:          0.273 m LVOT/AV VTI ratio: 0.36  AORTA Ao Root diam: 3.20 cm Ao Asc diam:  4.00 cm MITRAL VALVE                TRICUSPID VALVE MV Area (PHT): 2.66 cm     TR Peak grad:   30.0 mmHg MV Decel Time: 285 msec     TR Vmax:        274.00 cm/s MV E velocity: 106.00 cm/s MV A velocity: 151.00 cm/s  SHUNTS MV E/A ratio:  0.70         Systemic VTI:  0.27 m                             Systemic Diam: 2.00 cm Keller Paterson Electronically signed by Keller Paterson Signature Date/Time: 09/06/2024/5:38:37 PM    Final    IR IMAGING GUIDED PORT INSERTION Result Date:  09/04/2024 CLINICAL DATA:  UROTHELIAL MALIGNANCY, ACCESS FOR CHEMOTHERAPY EXAM: RIGHT INTERNAL JUGULAR SINGLE LUMEN POWER PORT CATHETER INSERTION Date:  09/04/2024 09/04/2024 3:17 pm Radiologist:  CHRISTELLA. Frederic Specking, MD Guidance:  Ultrasound and fluoroscopic MEDICATIONS: 1% lidocaine  local with epinephrine  ANESTHESIA/SEDATION: Versed  2.5 mg IV; Fentanyl  125 mcg IV; Moderate Sedation Time:  29 minute The patient was continuously monitored  during the procedure by the interventional radiology nurse under my direct supervision. FLUOROSCOPY: One minutes, 6 seconds (2 mGy) COMPLICATIONS: None immediate. CONTRAST:  None. PROCEDURE: Informed consent was obtained from the patient following explanation of the procedure, risks, benefits and alternatives. The patient understands, agrees and consents for the procedure. All questions were addressed. A time out was performed. Maximal barrier sterile technique utilized including caps, mask, sterile gowns, sterile gloves, large sterile drape, hand hygiene, and 2% chlorhexidine  scrub. Under sterile conditions and local anesthesia, right internal jugular micropuncture venous access was performed. Access was performed with ultrasound. Images were obtained for documentation of the patent right internal jugular vein. A guide wire was inserted followed by a transitional dilator. This allowed insertion of a guide wire and catheter into the IVC. Measurements were obtained from the SVC / RA junction back to the right IJ venotomy site. In the right infraclavicular chest, a subcutaneous pocket was created over the second anterior rib. This was done under sterile conditions and local anesthesia. 1% lidocaine  with epinephrine  was utilized for this. A 2.5 cm incision was made in the skin. Blunt dissection was performed to create a subcutaneous pocket over the right pectoralis major muscle. The pocket was flushed with saline vigorously. There was adequate hemostasis. The port catheter was assembled and  checked for leakage. The port catheter was secured in the pocket with two retention sutures. The tubing was tunneled subcutaneously to the right venotomy site and inserted into the SVC/RA junction through a valved peel-away sheath. Position was confirmed with fluoroscopy. Images were obtained for documentation. The patient tolerated the procedure well. No immediate complications. Incisions were closed in a two layer fashion with 4 - 0 Vicryl suture. Dermabond was applied to the skin. The port catheter was accessed, blood was aspirated followed by saline and heparin  flushes. Needle was removed. A dry sterile dressing was applied. IMPRESSION: Ultrasound and fluoroscopically guided right internal jugular single lumen power port catheter insertion. Tip in the SVC/RA junction. Catheter ready for use. Electronically Signed   By: CHRISTELLA.  Shick M.D.   On: 09/04/2024 15:22     Assessment and plan- Patient is a 78 y.o. female with prior history of breast cancer now with T3 N0 upper urothelial tract high-grade urothelial carcinoma.  She is here for on treatment assessment prior to cycle 1 of neoadjuvant gemcitabine  cisplatin  chemotherapy.    Counts okay to proceed with cycle 1 day 1 of gemcitabine  cisplatin  chemotherapy today.  She will be receiving gemcitabine  1000 mg/m along with cisplatin  split dose 35 mg/m on day 1 and day 8 every 3 cycles.  She will also receive growth factor support.  I will see her back in 3 weeks for cycle 2.  Plan is to complete 4 cycles of neoadjuvant chemotherapy followed by consideration for surgery  It has been 3 months since patient had her last CT abdomen and I have repeated her CT abdomen at this time I have reviewed images independently which does not reveal any evidence of progressive disease but I am waiting for her official report to come back   Visit Diagnosis 1. Urothelial cancer (HCC)   2. Encounter for antineoplastic chemotherapy      Dr. Annah Skene, MD, MPH [facility] [phone] 09/08/2024 9:20 AM

## 2024-09-08 NOTE — Patient Instructions (Signed)

## 2024-09-10 ENCOUNTER — Other Ambulatory Visit: Payer: Self-pay

## 2024-09-11 ENCOUNTER — Telehealth: Payer: Self-pay

## 2024-09-11 NOTE — Telephone Encounter (Signed)
Telephone call to patient for follow up after receiving first infusion.   No answer but left message stating we were calling to check on them.  Encouraged patient to call for any questions or concerns.   

## 2024-09-13 ENCOUNTER — Telehealth: Payer: Self-pay | Admitting: *Deleted

## 2024-09-13 NOTE — Telephone Encounter (Signed)
 Protonix 20 mg daily. Bactrim  ds for 5 days empirically for uti

## 2024-09-13 NOTE — Telephone Encounter (Signed)
 The patient called and said that she is having burning in her stomach and she urinated 15 times over the night and she has hardly got any sleep.  I talked to her this morning she says that she is having some pressure on the right side when she is doing urination.  She took a Tylenol  and it.  Thinks she might take another 1 later today.  And she has gotten very much sleep so that does not make it better.  I told her that I will send the message to Dr. Melanee and team and see what they think.

## 2024-09-14 ENCOUNTER — Ambulatory Visit (INDEPENDENT_AMBULATORY_CARE_PROVIDER_SITE_OTHER): Payer: Medicare (Managed Care) | Admitting: Vascular Surgery

## 2024-09-14 MED ORDER — SULFAMETHOXAZOLE-TRIMETHOPRIM 800-160 MG PO TABS: 1.0000 | ORAL_TABLET | Freq: Two times a day (BID) | ORAL | 0 refills | Status: AC

## 2024-09-14 MED ORDER — PANTOPRAZOLE SODIUM 20 MG PO TBEC: 20.0000 mg | DELAYED_RELEASE_TABLET | Freq: Every day | ORAL | 2 refills | Status: DC

## 2024-09-14 NOTE — Telephone Encounter (Signed)
 Per Dr. Melanee Protonix 20 mg daily. Bactrim  ds for 5 days empirically for uti.  Outbound call; spoke to patient and informed of above.  Patient verbalized understanding.

## 2024-09-14 NOTE — Addendum Note (Signed)
 Addended by: Myriam Brandhorst on: 09/14/2024 09:45 AM   Modules accepted: Orders

## 2024-09-15 ENCOUNTER — Inpatient Hospital Stay

## 2024-09-15 ENCOUNTER — Inpatient Hospital Stay: Attending: Oncology

## 2024-09-15 ENCOUNTER — Encounter: Payer: Self-pay | Admitting: Oncology

## 2024-09-15 VITALS — BP 137/75 | HR 62 | Temp 97.8°F | Wt 204.4 lb

## 2024-09-15 DIAGNOSIS — D6481 Anemia due to antineoplastic chemotherapy: Secondary | ICD-10-CM | POA: Diagnosis not present

## 2024-09-15 DIAGNOSIS — Z7982 Long term (current) use of aspirin: Secondary | ICD-10-CM | POA: Diagnosis not present

## 2024-09-15 DIAGNOSIS — Z8042 Family history of malignant neoplasm of prostate: Secondary | ICD-10-CM | POA: Insufficient documentation

## 2024-09-15 DIAGNOSIS — M47814 Spondylosis without myelopathy or radiculopathy, thoracic region: Secondary | ICD-10-CM | POA: Diagnosis not present

## 2024-09-15 DIAGNOSIS — Z8554 Personal history of malignant neoplasm of ureter: Secondary | ICD-10-CM | POA: Diagnosis not present

## 2024-09-15 DIAGNOSIS — Z9012 Acquired absence of left breast and nipple: Secondary | ICD-10-CM | POA: Insufficient documentation

## 2024-09-15 DIAGNOSIS — E119 Type 2 diabetes mellitus without complications: Secondary | ICD-10-CM | POA: Insufficient documentation

## 2024-09-15 DIAGNOSIS — I083 Combined rheumatic disorders of mitral, aortic and tricuspid valves: Secondary | ICD-10-CM | POA: Insufficient documentation

## 2024-09-15 DIAGNOSIS — K5730 Diverticulosis of large intestine without perforation or abscess without bleeding: Secondary | ICD-10-CM | POA: Insufficient documentation

## 2024-09-15 DIAGNOSIS — T451X5A Adverse effect of antineoplastic and immunosuppressive drugs, initial encounter: Secondary | ICD-10-CM | POA: Diagnosis not present

## 2024-09-15 DIAGNOSIS — Z5111 Encounter for antineoplastic chemotherapy: Secondary | ICD-10-CM | POA: Diagnosis not present

## 2024-09-15 DIAGNOSIS — R232 Flushing: Secondary | ICD-10-CM | POA: Diagnosis not present

## 2024-09-15 DIAGNOSIS — E278 Other specified disorders of adrenal gland: Secondary | ICD-10-CM | POA: Insufficient documentation

## 2024-09-15 DIAGNOSIS — Z9049 Acquired absence of other specified parts of digestive tract: Secondary | ICD-10-CM | POA: Insufficient documentation

## 2024-09-15 DIAGNOSIS — N132 Hydronephrosis with renal and ureteral calculous obstruction: Secondary | ICD-10-CM | POA: Diagnosis not present

## 2024-09-15 DIAGNOSIS — Z86018 Personal history of other benign neoplasm: Secondary | ICD-10-CM | POA: Insufficient documentation

## 2024-09-15 DIAGNOSIS — I77810 Thoracic aortic ectasia: Secondary | ICD-10-CM | POA: Insufficient documentation

## 2024-09-15 DIAGNOSIS — M47816 Spondylosis without myelopathy or radiculopathy, lumbar region: Secondary | ICD-10-CM | POA: Insufficient documentation

## 2024-09-15 DIAGNOSIS — Z79899 Other long term (current) drug therapy: Secondary | ICD-10-CM | POA: Insufficient documentation

## 2024-09-15 DIAGNOSIS — Z801 Family history of malignant neoplasm of trachea, bronchus and lung: Secondary | ICD-10-CM | POA: Insufficient documentation

## 2024-09-15 DIAGNOSIS — Z90710 Acquired absence of both cervix and uterus: Secondary | ICD-10-CM | POA: Insufficient documentation

## 2024-09-15 DIAGNOSIS — Z860101 Personal history of adenomatous and serrated colon polyps: Secondary | ICD-10-CM | POA: Diagnosis not present

## 2024-09-15 DIAGNOSIS — Z853 Personal history of malignant neoplasm of breast: Secondary | ICD-10-CM | POA: Insufficient documentation

## 2024-09-15 DIAGNOSIS — Z808 Family history of malignant neoplasm of other organs or systems: Secondary | ICD-10-CM | POA: Insufficient documentation

## 2024-09-15 DIAGNOSIS — Z59868 Other specified financial insecurity: Secondary | ICD-10-CM | POA: Diagnosis not present

## 2024-09-15 DIAGNOSIS — E785 Hyperlipidemia, unspecified: Secondary | ICD-10-CM | POA: Insufficient documentation

## 2024-09-15 DIAGNOSIS — C689 Malignant neoplasm of urinary organ, unspecified: Secondary | ICD-10-CM

## 2024-09-15 DIAGNOSIS — I119 Hypertensive heart disease without heart failure: Secondary | ICD-10-CM | POA: Diagnosis not present

## 2024-09-15 DIAGNOSIS — Z8249 Family history of ischemic heart disease and other diseases of the circulatory system: Secondary | ICD-10-CM | POA: Insufficient documentation

## 2024-09-15 DIAGNOSIS — Z8673 Personal history of transient ischemic attack (TIA), and cerebral infarction without residual deficits: Secondary | ICD-10-CM | POA: Insufficient documentation

## 2024-09-15 DIAGNOSIS — Z87442 Personal history of urinary calculi: Secondary | ICD-10-CM | POA: Insufficient documentation

## 2024-09-15 DIAGNOSIS — J449 Chronic obstructive pulmonary disease, unspecified: Secondary | ICD-10-CM | POA: Diagnosis not present

## 2024-09-15 DIAGNOSIS — I700 Atherosclerosis of aorta: Secondary | ICD-10-CM | POA: Insufficient documentation

## 2024-09-15 DIAGNOSIS — Z923 Personal history of irradiation: Secondary | ICD-10-CM | POA: Insufficient documentation

## 2024-09-15 DIAGNOSIS — C679 Malignant neoplasm of bladder, unspecified: Secondary | ICD-10-CM | POA: Diagnosis present

## 2024-09-15 DIAGNOSIS — Z87891 Personal history of nicotine dependence: Secondary | ICD-10-CM | POA: Insufficient documentation

## 2024-09-15 LAB — CBC WITH DIFFERENTIAL (CANCER CENTER ONLY)
Abs Immature Granulocytes: 0.01 K/uL (ref 0.00–0.07)
Basophils Absolute: 0 K/uL (ref 0.0–0.1)
Basophils Relative: 0 %
Eosinophils Absolute: 0.1 K/uL (ref 0.0–0.5)
Eosinophils Relative: 3 %
HCT: 34 % — ABNORMAL LOW (ref 36.0–46.0)
Hemoglobin: 11.3 g/dL — ABNORMAL LOW (ref 12.0–15.0)
Immature Granulocytes: 0 %
Lymphocytes Relative: 42 %
Lymphs Abs: 1.2 K/uL (ref 0.7–4.0)
MCH: 29.5 pg (ref 26.0–34.0)
MCHC: 33.2 g/dL (ref 30.0–36.0)
MCV: 88.8 fL (ref 80.0–100.0)
Monocytes Absolute: 0.1 K/uL (ref 0.1–1.0)
Monocytes Relative: 5 %
Neutro Abs: 1.4 K/uL — ABNORMAL LOW (ref 1.7–7.7)
Neutrophils Relative %: 50 %
Platelet Count: 170 K/uL (ref 150–400)
RBC: 3.83 MIL/uL — ABNORMAL LOW (ref 3.87–5.11)
RDW: 15.7 % — ABNORMAL HIGH (ref 11.5–15.5)
WBC Count: 2.8 K/uL — ABNORMAL LOW (ref 4.0–10.5)
nRBC: 0 % (ref 0.0–0.2)

## 2024-09-15 LAB — CMP (CANCER CENTER ONLY)
ALT: 13 U/L (ref 0–44)
AST: 20 U/L (ref 15–41)
Albumin: 3.7 g/dL (ref 3.5–5.0)
Alkaline Phosphatase: 59 U/L (ref 38–126)
Anion gap: 7 (ref 5–15)
BUN: 20 mg/dL (ref 8–23)
CO2: 28 mmol/L (ref 22–32)
Calcium: 8.8 mg/dL — ABNORMAL LOW (ref 8.9–10.3)
Chloride: 99 mmol/L (ref 98–111)
Creatinine: 0.82 mg/dL (ref 0.44–1.00)
GFR, Estimated: >60 mL/min (ref >60)
Glucose, Bld: 131 mg/dL — ABNORMAL HIGH (ref 70–99)
Potassium: 3.6 mmol/L (ref 3.5–5.1)
Sodium: 134 mmol/L — ABNORMAL LOW (ref 135–145)
Total Bilirubin: 0.8 mg/dL (ref 0.0–1.2)
Total Protein: 6.2 g/dL — ABNORMAL LOW (ref 6.5–8.1)

## 2024-09-15 LAB — MAGNESIUM: Magnesium: 1.8 mg/dL (ref 1.7–2.4)

## 2024-09-15 MED ORDER — APREPITANT 130 MG/18ML IV EMUL
130.0000 mg | Freq: Once | INTRAVENOUS | Status: AC
  Administered 2024-09-15: 130 mg via INTRAVENOUS
  Filled 2024-09-15: qty 18

## 2024-09-15 MED ORDER — SODIUM CHLORIDE 0.9 % IV SOLN
1000.0000 mg/m2 | Freq: Once | INTRAVENOUS | Status: AC
  Administered 2024-09-15: 2052 mg via INTRAVENOUS
  Filled 2024-09-15: qty 53.97

## 2024-09-15 MED ORDER — MAGNESIUM SULFATE 2 GM/50ML IV SOLN
2.0000 g | Freq: Once | INTRAVENOUS | Status: AC
  Administered 2024-09-15: 2 g via INTRAVENOUS
  Filled 2024-09-15: qty 50

## 2024-09-15 MED ORDER — PALONOSETRON HCL INJECTION 0.25 MG/5ML
0.2500 mg | Freq: Once | INTRAVENOUS | Status: AC
  Administered 2024-09-15: 0.25 mg via INTRAVENOUS
  Filled 2024-09-15: qty 5

## 2024-09-15 MED ORDER — SODIUM CHLORIDE 0.9 % IV SOLN
INTRAVENOUS | Status: DC
  Filled 2024-09-15: qty 250

## 2024-09-15 MED ORDER — POTASSIUM CHLORIDE IN NACL 20-0.9 MEQ/L-% IV SOLN
Freq: Once | INTRAVENOUS | Status: AC
  Filled 2024-09-15: qty 1000

## 2024-09-15 MED ORDER — SODIUM CHLORIDE 0.9 % IV SOLN
35.0000 mg/m2 | Freq: Once | INTRAVENOUS | Status: AC
  Administered 2024-09-15: 72 mg via INTRAVENOUS
  Filled 2024-09-15: qty 72

## 2024-09-15 MED ORDER — DEXAMETHASONE SODIUM PHOSPHATE 10 MG/ML IJ SOLN
10.0000 mg | Freq: Once | INTRAMUSCULAR | Status: AC
  Administered 2024-09-15: 10 mg via INTRAVENOUS
  Filled 2024-09-15: qty 1

## 2024-09-15 NOTE — Patient Instructions (Signed)

## 2024-09-18 ENCOUNTER — Inpatient Hospital Stay

## 2024-09-18 VITALS — BP 154/61 | HR 66

## 2024-09-18 DIAGNOSIS — C689 Malignant neoplasm of urinary organ, unspecified: Secondary | ICD-10-CM

## 2024-09-18 DIAGNOSIS — Z5111 Encounter for antineoplastic chemotherapy: Secondary | ICD-10-CM | POA: Diagnosis not present

## 2024-09-18 MED ORDER — PEGFILGRASTIM-CBQV 6 MG/0.6ML ~~LOC~~ SOSY
6.0000 mg | PREFILLED_SYRINGE | Freq: Once | SUBCUTANEOUS | Status: AC
  Administered 2024-09-18: 6 mg via SUBCUTANEOUS
  Filled 2024-09-18: qty 0.6

## 2024-09-18 NOTE — Patient Instructions (Signed)

## 2024-09-26 ENCOUNTER — Encounter: Payer: Self-pay | Admitting: Oncology

## 2024-09-29 ENCOUNTER — Inpatient Hospital Stay

## 2024-09-29 ENCOUNTER — Encounter: Payer: Self-pay | Admitting: Oncology

## 2024-09-29 ENCOUNTER — Inpatient Hospital Stay: Admitting: Oncology

## 2024-09-29 VITALS — BP 128/50 | HR 74

## 2024-09-29 VITALS — BP 141/64 | HR 63 | Temp 96.9°F | Resp 16 | Wt 203.0 lb

## 2024-09-29 DIAGNOSIS — D6481 Anemia due to antineoplastic chemotherapy: Secondary | ICD-10-CM

## 2024-09-29 DIAGNOSIS — T451X5A Adverse effect of antineoplastic and immunosuppressive drugs, initial encounter: Secondary | ICD-10-CM

## 2024-09-29 DIAGNOSIS — Z5111 Encounter for antineoplastic chemotherapy: Secondary | ICD-10-CM | POA: Diagnosis not present

## 2024-09-29 DIAGNOSIS — C689 Malignant neoplasm of urinary organ, unspecified: Secondary | ICD-10-CM | POA: Diagnosis not present

## 2024-09-29 LAB — CMP (CANCER CENTER ONLY)
ALT: 12 U/L (ref 0–44)
AST: 17 U/L (ref 15–41)
Albumin: 3.6 g/dL (ref 3.5–5.0)
Alkaline Phosphatase: 92 U/L (ref 38–126)
Anion gap: 9 (ref 5–15)
BUN: 13 mg/dL (ref 8–23)
CO2: 25 mmol/L (ref 22–32)
Calcium: 8.7 mg/dL — ABNORMAL LOW (ref 8.9–10.3)
Chloride: 101 mmol/L (ref 98–111)
Creatinine: 0.78 mg/dL (ref 0.44–1.00)
GFR, Estimated: >60 mL/min (ref >60)
Glucose, Bld: 163 mg/dL — ABNORMAL HIGH (ref 70–99)
Potassium: 3.5 mmol/L (ref 3.5–5.1)
Sodium: 135 mmol/L (ref 135–145)
Total Bilirubin: 0.5 mg/dL (ref 0.0–1.2)
Total Protein: 6 g/dL — ABNORMAL LOW (ref 6.5–8.1)

## 2024-09-29 LAB — CBC WITH DIFFERENTIAL (CANCER CENTER ONLY)
Abs Immature Granulocytes: 0.2 K/uL — ABNORMAL HIGH (ref 0.00–0.07)
Basophils Absolute: 0 K/uL (ref 0.0–0.1)
Basophils Relative: 0 %
Eosinophils Absolute: 0.1 K/uL (ref 0.0–0.5)
Eosinophils Relative: 1 %
HCT: 32.7 % — ABNORMAL LOW (ref 36.0–46.0)
Hemoglobin: 10.7 g/dL — ABNORMAL LOW (ref 12.0–15.0)
Immature Granulocytes: 3 %
Lymphocytes Relative: 18 %
Lymphs Abs: 1.2 K/uL (ref 0.7–4.0)
MCH: 29.8 pg (ref 26.0–34.0)
MCHC: 32.7 g/dL (ref 30.0–36.0)
MCV: 91.1 fL (ref 80.0–100.0)
Monocytes Absolute: 0.6 K/uL (ref 0.1–1.0)
Monocytes Relative: 8 %
Neutro Abs: 4.9 K/uL (ref 1.7–7.7)
Neutrophils Relative %: 70 %
Platelet Count: 273 K/uL (ref 150–400)
RBC: 3.59 MIL/uL — ABNORMAL LOW (ref 3.87–5.11)
RDW: 16.9 % — ABNORMAL HIGH (ref 11.5–15.5)
WBC Count: 7 K/uL (ref 4.0–10.5)
nRBC: 0 % (ref 0.0–0.2)

## 2024-09-29 LAB — MAGNESIUM: Magnesium: 1.9 mg/dL (ref 1.7–2.4)

## 2024-09-29 MED ORDER — SODIUM CHLORIDE 0.9 % IV SOLN
1000.0000 mg/m2 | Freq: Once | INTRAVENOUS | Status: AC
  Administered 2024-09-29: 2052 mg via INTRAVENOUS
  Filled 2024-09-29: qty 53.97

## 2024-09-29 MED ORDER — PALONOSETRON HCL INJECTION 0.25 MG/5ML
0.2500 mg | Freq: Once | INTRAVENOUS | Status: AC
  Administered 2024-09-29: 0.25 mg via INTRAVENOUS
  Filled 2024-09-29: qty 5

## 2024-09-29 MED ORDER — DEXAMETHASONE SOD PHOSPHATE PF 10 MG/ML IJ SOLN
10.0000 mg | Freq: Once | INTRAMUSCULAR | Status: AC
  Administered 2024-09-29: 10 mg via INTRAVENOUS

## 2024-09-29 MED ORDER — APREPITANT 130 MG/18ML IV EMUL
130.0000 mg | Freq: Once | INTRAVENOUS | Status: AC
  Administered 2024-09-29: 130 mg via INTRAVENOUS
  Filled 2024-09-29: qty 18

## 2024-09-29 MED ORDER — SODIUM CHLORIDE 0.9 % IV SOLN
INTRAVENOUS | Status: DC
  Filled 2024-09-29 (×2): qty 250

## 2024-09-29 MED ORDER — SODIUM CHLORIDE 0.9 % IV SOLN
35.0000 mg/m2 | Freq: Once | INTRAVENOUS | Status: AC
  Administered 2024-09-29: 72 mg via INTRAVENOUS
  Filled 2024-09-29: qty 72

## 2024-09-29 MED ORDER — POTASSIUM CHLORIDE IN NACL 20-0.9 MEQ/L-% IV SOLN
Freq: Once | INTRAVENOUS | Status: AC
  Filled 2024-09-29: qty 1000

## 2024-09-29 MED ORDER — MAGNESIUM SULFATE 2 GM/50ML IV SOLN
2.0000 g | Freq: Once | INTRAVENOUS | Status: AC
  Administered 2024-09-29: 2 g via INTRAVENOUS
  Filled 2024-09-29: qty 50

## 2024-09-29 NOTE — Patient Instructions (Signed)
 [facility] - A DEPT OF [facility]  Discharge Instructions: Thank you for choosing [facility] to provide your oncology and hematology care.  If you have a lab appointment with the Cancer Center, please go directly to the Cancer Center and check in at the registration area.  Wear comfortable clothing and clothing appropriate for easy access to any Portacath or PICC line.   We strive to give you quality time with your provider. You may need to reschedule your appointment if you arrive late (15 or more minutes).  Arriving late affects you and other patients whose appointments are after yours.  Also, if you miss three or more appointments without notifying the office, you may be dismissed from the clinic at the provider's discretion.      For prescription refill requests, have your pharmacy contact our office and allow 72 hours for refills to be completed.    Today you received the following chemotherapy and/or immunotherapy agents- gemzar , cisplatin       To help prevent nausea and vomiting after your treatment, we encourage you to take your nausea medication as directed.  BELOW ARE SYMPTOMS THAT SHOULD BE REPORTED IMMEDIATELY: *FEVER GREATER THAN 100.4 F (38 C) OR HIGHER *CHILLS OR SWEATING *NAUSEA AND VOMITING THAT IS NOT CONTROLLED WITH YOUR NAUSEA MEDICATION *UNUSUAL SHORTNESS OF BREATH *UNUSUAL BRUISING OR BLEEDING *URINARY PROBLEMS (pain or burning when urinating, or frequent urination) *BOWEL PROBLEMS (unusual diarrhea, constipation, pain near the anus) TENDERNESS IN MOUTH AND THROAT WITH OR WITHOUT PRESENCE OF ULCERS (sore throat, sores in mouth, or a toothache) UNUSUAL RASH, SWELLING OR PAIN  UNUSUAL VAGINAL DISCHARGE OR ITCHING   Items with * indicate a potential emergency and should be followed up as soon as possible or go to the Emergency Department if any problems should occur.  Please show the CHEMOTHERAPY ALERT CARD or  IMMUNOTHERAPY ALERT CARD at check-in to the Emergency Department and triage nurse.  Should you have questions after your visit or need to cancel or reschedule your appointment, please contact [facility] - A DEPT OF [facility]  [phone] and follow the prompts.  Office hours are 8:00 a.m. to 4:30 p.m. Monday - Friday. Please note that voicemails left after 4:00 p.m. may not be returned until the following business day.  We are closed weekends and major holidays. You have access to a nurse at all times for urgent questions. Please call the main number to the clinic [phone] and follow the prompts.  For any non-urgent questions, you may also contact your provider using MyChart. We now offer e-Visits for anyone 35 and older to request care online for non-urgent symptoms. For details visit [email or web address].   Also download the MyChart app! Go to the app store, search MyChart, open the app, select [facility], and log in with your MyChart username and password.

## 2024-09-29 NOTE — Progress Notes (Signed)
 Per MD, OK to resume post-hydration with cisplatin .

## 2024-09-29 NOTE — Progress Notes (Signed)
 Hematology/Oncology Consult note [facility]  [phone] [phone]  Patient Care Team: Valora Lynwood FALCON, MD as PCP - General (Family Medicine)   Name of the patient: Jennifer Maddox  [id number]  1946-02-15   Date of visit: 09/29/24  Diagnosis- 1.  Upper urothelial tract high-grade urothelial carcinoma 2.  History of breast cancer  Chief complaint/ Reason for visit-on treatment assessment prior to cycle 2-day 1 of neoadjuvant gemcitabine  cisplatin  chemotherapy  Heme/Onc history: Patient is a 78 year old female who was diagnosed with breast cancer both in 2011 right breast cancer and 2019 left breast cancer stage I ER/PR positive HER2 negative.  Most recently patient completed 5 years of Arimidex  in March 2025 after left mastectomy.  She did not require postmastectomy radiation on the left side.   Patient was found to have hematuria and was seen by urology Dr. Twylla.  She underwent CT abdomen pelvis with contrast in June 2025 which showed a 6 mm ureteral stone and associated moderate right hydronephrosis.  She underwent right ureteral stone extraction and at that time was noted to have abnormal appearing ureteral mucosa proximal to the stone.  This was biopsied and was consistent with high-grade papillary urothelial carcinoma.  She also underwent ureteroscopy on 07/31/2024 which showed a 5 cm area of right ureteral narrowing and was unable to be traversed with the ureteroscope.  CT chest without contrast in July 2025 did not show any evidence of distant metastatic disease.  She was evaluated by [facility] urology as well as medical oncology.  Neoadjuvant gemcitabine  and cisplatin  chemotherapy was recommended for suspected T3 disease for 4 cycles followed by consideration for right nephroureterectomy.  Interval history- Jennifer Maddox is an 78 year old female with urothelial tract carcinoma who presents for follow-up after one cycle of gemcitabine  cisplatin   chemotherapy.  She has completed one cycle of gemcitabine  cisplatin  chemotherapy, administered as two weeks on and one week off. She is tolerating the treatment well, with no significant sickness or hair loss. Her energy levels and appetite remain stable, and she has not lost weight. She is making efforts to maintain her fluid intake, although she finds it challenging to drink enough water .  She experienced lower back pain two weeks ago, which resolved with Tylenol . No current pain or blood in her urine.  She has a history of breast cancer and completed five years of Arimidex , which she stopped in March. Her breast cancer treatments are now concluded.  Recent lab results show a hemoglobin level of 10.7, slightly lower than the previous level of 11. Her white blood cell count and platelets are normal.  ECOG PS- 1 Pain scale- 0   Review of systems- Review of Systems  Constitutional:  Negative for chills, fever, malaise/fatigue and weight loss.  HENT:  Negative for congestion, ear discharge and nosebleeds.   Eyes:  Negative for blurred vision.  Respiratory:  Negative for cough, hemoptysis, sputum production, shortness of breath and wheezing.   Cardiovascular:  Negative for chest pain, palpitations, orthopnea and claudication.  Gastrointestinal:  Negative for abdominal pain, blood in stool, constipation, diarrhea, heartburn, melena, nausea and vomiting.  Genitourinary:  Negative for dysuria, flank pain, frequency, hematuria and urgency.  Musculoskeletal:  Negative for back pain, joint pain and myalgias.  Skin:  Negative for rash.  Neurological:  Negative for dizziness, tingling, focal weakness, seizures, weakness and headaches.  Endo/Heme/Allergies:  Does not bruise/bleed easily.  Psychiatric/Behavioral:  Negative for depression and suicidal ideas. The patient does  not have insomnia.       Allergies  Allergen Reactions   Atorvastatin Other (See Comments)    Cramps   Naproxen Sodium  Other (See Comments)    Head and Neck Puritis     Past Medical History:  Diagnosis Date   Adenoma of left adrenal gland    Anemia    Aortic atherosclerosis    Aortic valve stenosis    Arthritis    Bilateral renal cysts    Breast cancer, left (HCC)    Colon polyps    COPD (chronic obstructive pulmonary disease) (HCC)    DDD (degenerative disc disease), thoracolumbar    Degenerative disc disease, lumbar    Diverticulosis    DM (diabetes mellitus), type 2 (HCC)    Dyspnea    History of kidney stones    Hyperlipidemia    Hypertension    Insomnia    a.) uses melatonin + trazodone  PRN   Irritable bowel syndrome with both constipation and diarrhea    Long-term use of aspirin therapy    NAFLD (nonalcoholic fatty liver disease)    Obesity    OSA on CPAP    Peripheral edema    Personal history of radiation therapy    Pulmonary hypertension (HCC)    Right inguinal hernia    TIA (transient ischemic attack) 06/2007     Past Surgical History:  Procedure Laterality Date   ABDOMINAL HYSTERECTOMY     around age 34 ; ovaries intact   BREAST BIOPSY Left 12/01/2018   Pathology LEFT breast mass 11 o'clock location: Invasive mammary   BREAST EXCISIONAL BIOPSY Left 2011   breast ca with radation   CHOLECYSTECTOMY     COLONOSCOPY W/ POLYPECTOMY     COLONOSCOPY WITH PROPOFOL  N/A 10/29/2017   Procedure: COLONOSCOPY WITH PROPOFOL ;  Surgeon: Viktoria Lamar DASEN, MD;  Location: [facility] ENDOSCOPY;  Service: Endoscopy;  Laterality: N/A;   COLONOSCOPY WITH PROPOFOL  N/A 07/24/2022   Procedure: COLONOSCOPY WITH PROPOFOL ;  Surgeon: Maryruth Ole DASEN, MD;  Location: [facility] ENDOSCOPY;  Service: Endoscopy;  Laterality: N/A;   CYSTOSCOPY/URETEROSCOPY/HOLMIUM LASER/STENT PLACEMENT Right 06/06/2024   Procedure: CYSTOSCOPY/URETEROSCOPY/HOLMIUM LASER/STENT PLACEMENT;  Surgeon: Twylla Glendia BROCKS, MD;  Location: [facility] ORS;  Service: Urology;  Laterality: Right;   ESOPHAGOGASTRODUODENOSCOPY (EGD) WITH PROPOFOL  N/A  10/29/2017   Procedure: ESOPHAGOGASTRODUODENOSCOPY (EGD) WITH PROPOFOL ;  Surgeon: Viktoria Lamar DASEN, MD;  Location: [facility] ENDOSCOPY;  Service: Endoscopy;  Laterality: N/A;   IR IMAGING GUIDED PORT INSERTION  09/04/2024   KNEE ARTHROSCOPY W/ MENISCAL REPAIR Right    ROTATOR CUFF REPAIR Right 12/2023   SENTINEL NODE BIOPSY Left 01/17/2019   Procedure: SENTINEL NODE INJECTION;  Surgeon: Tye Millet, DO;  Location: [facility] ORS;  Service: General;  Laterality: Left;   TONSILLECTOMY     TOTAL MASTECTOMY Left 01/17/2019   Procedure: TOTAL MASTECTOMY;  Surgeon: Tye Millet, DO;  Location: [facility] ORS;  Service: General;  Laterality: Left;   URETERAL BIOPSY Right 06/06/2024   Procedure: BIOPSY, URETER;  Surgeon: Twylla Glendia BROCKS, MD;  Location: [facility] ORS;  Service: Urology;  Laterality: Right;    Social History   Socioeconomic History   Marital status: Married    Spouse name: Not on file   Number of children: Not on file   Years of education: Not on file   Highest education level: Not on file  Occupational History   Not on file  Tobacco Use   Smoking status: Former    Current packs/day: 0.00    Types: Cigarettes  Quit date: 12/14/1977    Years since quitting: 46.8   Smokeless tobacco: Never  Vaping Use   Vaping status: Never Used  Substance and Sexual Activity   Alcohol use: No   Drug use: No   Sexual activity: Not on file  Other Topics Concern   Not on file  Social History Narrative   Not on file   Social Drivers of Health   Financial Resource Strain: Medium Risk (08/08/2024)   Received from [facility]   Overall Financial Resource Strain (CARDIA)    How hard is it for you to pay for the very basics like food, housing, medical care, and heating?: Somewhat hard  Food Insecurity: No Food Insecurity (08/08/2024)   Received from [facility]   Hunger Vital Sign    Within the past 12 months, you worried that your food would run out before you got the money to buy more.: Never true     Within the past 12 months, the food you bought just didn't last and you didn't have money to get more.: Never true  Transportation Needs: No Transportation Needs (08/08/2024)   Received from [facility] - Transportation    Lack of Transportation (Medical): No    Lack of Transportation (Non-Medical): No  Physical Activity: Not on file  Stress: Not on file  Social Connections: Not on file  Intimate Partner Violence: Not At Risk (06/26/2024)   Received from [facility]   Humiliation, Afraid, Rape, and Kick questionnaire    Within the last year, have you been afraid of your partner or ex-partner?: No    Within the last year, have you been humiliated or emotionally abused in other ways by your partner or ex-partner?: No    Within the last year, have you been kicked, hit, slapped, or otherwise physically hurt by your partner or ex-partner?: No    Within the last year, have you been raped or forced to have any kind of sexual activity by your partner or ex-partner?: No    Family History  Problem Relation Age of Onset   Lung cancer Mother        smoker; deceased 20   Heart attack Father        deceased 30   Melanoma Brother 29       currently 74   Prostate cancer Brother 47       currently 63   Melanoma Son 45       below left armpit; currently 42   Breast cancer Neg Hx      Current Outpatient Medications:    amLODipine (NORVASC) 2.5 MG tablet, Take 2.5 mg by mouth., Disp: , Rfl:    aspirin EC 81 MG tablet, Take 81 mg by mouth daily. Swallow whole. (Patient not taking: Reported on 09/01/2024), Disp: , Rfl:    blood glucose meter kit and supplies, USE TWICE DAILY, Disp: , Rfl:    cyanocobalamin  (VITAMIN B12) 1000 MCG tablet, Take 2,000 mcg by mouth daily., Disp: , Rfl:    dexamethasone  (DECADRON ) 4 MG tablet, Take 2 tablets (8 mg) by mouth daily x 3 days starting the day after cisplatin  chemotherapy. Take with food., Disp: 30 tablet, Rfl: 1   docusate sodium  (COLACE)  100 MG capsule, Take 100 mg by mouth daily., Disp: , Rfl:    furosemide  (LASIX ) 20 MG tablet, Take 20 mg by mouth daily., Disp: , Rfl:    gabapentin  (NEURONTIN ) 300 MG capsule, Take 300  mg by mouth at bedtime. , Disp: , Rfl:    glucose blood test strip, , Disp: , Rfl:    HYDROcodone -acetaminophen  (NORCO/VICODIN) 5-325 MG tablet, Take 0.5-1 tablets by mouth every 6 (six) hours as needed for moderate pain (pain score 4-6)., Disp: 8 tablet, Rfl: 0   lidocaine -prilocaine  (EMLA ) cream, Apply to affected area once, Disp: 30 g, Rfl: 3   magnesium  oxide (MAG-OX) 400 (240 Mg) MG tablet, Take 400 mg by mouth daily., Disp: , Rfl:    MELATONIN PO, Take 12 mg by mouth at bedtime. , Disp: , Rfl:    Methylcellulose, Laxative, (CITRUCEL PO), Take 2 tablets by mouth daily., Disp: , Rfl:    Multiple Vitamins-Minerals (MULTIVITAMIN ADULT PO), Take 1 tablet by mouth daily. , Disp: , Rfl:    ondansetron  (ZOFRAN ) 8 MG tablet, Take 1 tablet (8 mg total) by mouth every 8 (eight) hours as needed for nausea or vomiting. Start on the third day after cisplatin ., Disp: 30 tablet, Rfl: 1   OZEMPIC, 2 MG/DOSE, 8 MG/3ML SOPN, Inject 1 mg into the skin once a week., Disp: , Rfl:    pantoprazole (PROTONIX) 20 MG tablet, Take 1 tablet (20 mg total) by mouth daily., Disp: 30 tablet, Rfl: 2   pioglitazone (ACTOS) 30 MG tablet, Take 30 mg by mouth daily., Disp: , Rfl:    potassium chloride  (KLOR-CON  M10) 10 MEQ tablet, Take 1 tablet by mouth daily., Disp: , Rfl:    prochlorperazine  (COMPAZINE ) 10 MG tablet, Take 1 tablet (10 mg total) by mouth every 6 (six) hours as needed (Nausea or vomiting)., Disp: 30 tablet, Rfl: 1   rosuvastatin (CRESTOR) 40 MG tablet, Take 40 mg by mouth at bedtime., Disp: , Rfl:    traZODone  (DESYREL ) 50 MG tablet, Take 50 mg by mouth at bedtime., Disp: , Rfl:    trospium  (SANCTURA ) 20 MG tablet, Take 1 tablet (20 mg total) by mouth 2 (two) times daily as needed (frequency,urgency,bladder spasm)., Disp: 20  tablet, Rfl: 0   TRUEplus Lancets 28G MISC, TEST BLOOD SUGAR AS DIRECTED, Disp: , Rfl:   Physical exam: There were no vitals filed for this visit. Physical Exam Cardiovascular:     Rate and Rhythm: Normal rate and regular rhythm.     Heart sounds: Normal heart sounds.  Pulmonary:     Effort: Pulmonary effort is normal.     Breath sounds: Normal breath sounds.  Skin:    General: Skin is warm and dry.  Neurological:     Mental Status: She is alert and oriented to person, place, and time.      I have personally reviewed labs listed below:    Latest Ref Rng & Units 09/29/2024    8:15 AM  CMP  Glucose 70 - 99 mg/dL 836   BUN 8 - 23 mg/dL 13   Creatinine 9.55 - 1.00 mg/dL 9.21   Sodium 864 - 854 mmol/L 135   Potassium 3.5 - 5.1 mmol/L 3.5   Chloride 98 - 111 mmol/L 101   CO2 22 - 32 mmol/L 25   Calcium 8.9 - 10.3 mg/dL 8.7   Total Protein 6.5 - 8.1 g/dL 6.0   Total Bilirubin 0.0 - 1.2 mg/dL 0.5   Alkaline Phos 38 - 126 U/L 92   AST 15 - 41 U/L 17   ALT 0 - 44 U/L 12       Latest Ref Rng & Units 09/29/2024    8:15 AM  CBC  WBC 4.0 -  10.5 K/uL 7.0   Hemoglobin 12.0 - 15.0 g/dL 89.2   Hematocrit 63.9 - 46.0 % 32.7   Platelets 150 - 400 K/uL 273    I have personally reviewed Radiology images listed below: No images are attached to the encounter.  CT ABDOMEN PELVIS W CONTRAST Result Date: 09/08/2024 CLINICAL DATA:  High-grade papillary urothelial carcinoma of the distal right ureter diagnosed 04/2024. Restaging prior to neoadjuvant chemotherapy. * Tracking Code: BO * EXAM: CT ABDOMEN AND PELVIS WITH CONTRAST TECHNIQUE: Multidetector CT imaging of the abdomen and pelvis was performed using the standard protocol following bolus administration of intravenous contrast. RADIATION DOSE REDUCTION: This exam was performed according to the departmental dose-optimization program which includes automated exposure control, adjustment of the mA and/or kV according to patient size and/or use  of iterative reconstruction technique. CONTRAST:  OMNIPAQUE  IOHEXOL  300 MG/ML  SOLN COMPARISON:  CT abdomen and pelvis dated 05/18/2024, 10/22/2017 FINDINGS: Lower chest: No focal consolidation or pulmonary nodule in the lung bases. No pleural effusion or pneumothorax demonstrated. Mild multichamber cardiomegaly. Mitral annular calcification. Hepatobiliary: Subcentimeter focus of arterial enhancement in segment 2 (2:12), present since 10/22/2017, likely flash filling venous malformation (hemangioma). Additional foci of ill-defined enhancement in the peripheral segment 2 (2:12, 13), in retrospect also present since 2018, likely venous malformations (hemangiomas). No intra or extrahepatic biliary ductal dilation. Cholecystectomy. Pancreas: No focal lesions or main ductal dilation. Spleen: Normal in size without focal abnormality. Adrenals/Urinary Tract: 13 mm left adrenal nodule measures 82 HU, unchanged in size from 2018, likely adenoma. No specific follow-up imaging recommended. No right adrenal nodule. No calculi or hydronephrosis. Bilateral exophytic simple/minimally complicated cysts measuring up to 13 mm in the right lower pole (4:112) and 12 mm in the interpolar left kidney (4:91). No specific follow-up imaging recommended. Additional subcentimeter hypodensities, too small to characterize. Suboptimal evaluation of the right ureter due to absence of contrast opacification, however there is mild asymmetric thickening and periureteral stranding involving the distal portion (2:76). No focal bladder wall thickening. Stomach/Bowel: Normal appearance of the stomach. No evidence of bowel wall thickening, distention, or inflammatory changes. Colonic diverticulosis without acute diverticulitis. Normal appendix. Vascular/Lymphatic: Aortic atherosclerosis. No enlarged abdominal or pelvic lymph nodes. Persistent right retrocrural ovoid hypodensity measuring 1.6 x 1.2 cm (2:14), present since 10/22/2017, likely  prominent cisterna chyli. Reproductive: No adnexal masses. Other: No free fluid, fluid collection, or free air. Musculoskeletal: No acute or abnormal lytic or blastic osseous lesions. Multilevel degenerative changes of the partially imaged thoracic and lumbar spine. IMPRESSION: 1. Suboptimal evaluation of the right ureter due to absence of contrast opacification, however there is mild asymmetric thickening and periureteral stranding involving the distal portion, likely corresponding to known urothelial carcinoma. 2. No evidence of metastatic disease in the abdomen or pelvis. 3.  Aortic Atherosclerosis (ICD10-I70.0). Electronically Signed   By: Limin  Xu M.D.   On: 09/08/2024 10:15   ECHOCARDIOGRAM COMPLETE Result Date: 09/06/2024    ECHOCARDIOGRAM REPORT   Patient Name:   NEKESHIA LENHARDT [facility] Date of Exam: 09/06/2024 Medical Rec #:  [id number]      Height:       65.0 in Accession #:    [id number]     Weight:       203.0 lb Date of Birth:  1946-07-14       BSA:          1.991 m Patient Age:    78 years       BP:  163/70 mmHg Patient Gender: F              HR:           72 bpm. Exam Location:  [facility] Procedure: 2D Echo and Strain Analysis (Both Spectral and Color Flow Doppler            were utilized during procedure). Indications:     urothelial carcinoma  History:         Patient has no prior history of Echocardiogram examinations.  Sonographer:     Tinnie Barefoot RDCS Referring Phys:  [id number] TRACY L ROSE Diagnosing Phys: Keller Paterson  Sonographer Comments: Global longitudinal strain was attempted. IMPRESSIONS  1. Left ventricular ejection fraction, by estimation, is 55 to 60%. The left ventricle has normal function. The left ventricle has no regional wall motion abnormalities. There is mild left ventricular hypertrophy. Left ventricular diastolic parameters are indeterminate.  2. Right ventricular systolic function is normal. The right ventricular size is normal. There is normal pulmonary artery systolic  pressure.  3. Left atrial size was mildly dilated.  4. The mitral valve is normal in structure. Trivial mitral valve regurgitation. Moderate mitral annular calcification.  5. The aortic valve is tricuspid. Aortic valve regurgitation is not visualized. Moderate aortic valve stenosis.  6. Aortic dilatation noted. There is mild dilatation of the ascending aorta, measuring 40 mm.  7. The inferior vena cava is normal in size with greater than 50% respiratory variability, suggesting right atrial pressure of 3 mmHg. FINDINGS  Left Ventricle: Left ventricular ejection fraction, by estimation, is 55 to 60%. The left ventricle has normal function. The left ventricle has no regional wall motion abnormalities. The left ventricular internal cavity size was normal in size. There is  mild left ventricular hypertrophy. Left ventricular diastolic parameters are indeterminate. Right Ventricle: The right ventricular size is normal. No increase in right ventricular wall thickness. Right ventricular systolic function is normal. There is normal pulmonary artery systolic pressure. The tricuspid regurgitant velocity is 2.74 m/s, and  with an assumed right atrial pressure of 3 mmHg, the estimated right ventricular systolic pressure is 33.0 mmHg. Left Atrium: Left atrial size was mildly dilated. Right Atrium: Right atrial size was normal in size. Pericardium: There is no evidence of pericardial effusion. Mitral Valve: The mitral valve is normal in structure. Moderate mitral annular calcification. Trivial mitral valve regurgitation. Tricuspid Valve: The tricuspid valve is normal in structure. Tricuspid valve regurgitation is trivial. Aortic Valve: The aortic valve is tricuspid. Aortic valve regurgitation is not visualized. Moderate aortic stenosis is present. Aortic valve mean gradient measures 23.0 mmHg. Aortic valve peak gradient measures 41.7 mmHg. Aortic valve area, by VTI measures 1.12 cm. Pulmonic Valve: The pulmonic valve was not well  visualized. Pulmonic valve regurgitation is trivial. Aorta: The aortic root is normal in size and structure and aortic dilatation noted. There is mild dilatation of the ascending aorta, measuring 40 mm. Venous: The inferior vena cava is normal in size with greater than 50% respiratory variability, suggesting right atrial pressure of 3 mmHg. IAS/Shunts: The atrial septum is grossly normal.  LEFT VENTRICLE PLAX 2D LVIDd:         5.40 cm   Diastology LVIDs:         3.40 cm   LV e' medial:    5.98 cm/s LV PW:         1.00 cm   LV E/e' medial:  17.7 LV IVS:        1.10 cm  LV e' lateral:   6.09 cm/s LVOT diam:     2.00 cm   LV E/e' lateral: 17.4 LV SV:         86 LV SV Index:   43 LVOT Area:     3.14 cm  RIGHT VENTRICLE             IVC RV Basal diam:  2.90 cm     IVC diam: 1.40 cm RV S prime:     17.10 cm/s TAPSE (M-mode): 3.2 cm LEFT ATRIUM             Index        RIGHT ATRIUM           Index LA diam:        4.00 cm 2.01 cm/m   RA Area:     16.90 cm LA Vol (A2C):   87.9 ml 44.16 ml/m  RA Volume:   44.00 ml  22.10 ml/m LA Vol (A4C):   67.6 ml 33.96 ml/m LA Biplane Vol: 77.0 ml 38.68 ml/m  AORTIC VALVE                     PULMONIC VALVE AV Area (Vmax):    1.20 cm      PR End Diast Vel: 4.58 msec AV Area (Vmean):   1.21 cm AV Area (VTI):     1.12 cm AV Vmax:           323.00 cm/s AV Vmean:          221.000 cm/s AV VTI:            0.767 m AV Peak Grad:      41.7 mmHg AV Mean Grad:      23.0 mmHg LVOT Vmax:         123.50 cm/s LVOT Vmean:        85.050 cm/s LVOT VTI:          0.273 m LVOT/AV VTI ratio: 0.36  AORTA Ao Root diam: 3.20 cm Ao Asc diam:  4.00 cm MITRAL VALVE                TRICUSPID VALVE MV Area (PHT): 2.66 cm     TR Peak grad:   30.0 mmHg MV Decel Time: 285 msec     TR Vmax:        274.00 cm/s MV E velocity: 106.00 cm/s MV A velocity: 151.00 cm/s  SHUNTS MV E/A ratio:  0.70         Systemic VTI:  0.27 m                             Systemic Diam: 2.00 cm Keller Paterson Electronically signed by  Keller Paterson Signature Date/Time: 09/06/2024/5:38:37 PM    Final    IR IMAGING GUIDED PORT INSERTION Result Date: 09/04/2024 CLINICAL DATA:  UROTHELIAL MALIGNANCY, ACCESS FOR CHEMOTHERAPY EXAM: RIGHT INTERNAL JUGULAR SINGLE LUMEN POWER PORT CATHETER INSERTION Date:  09/04/2024 09/04/2024 3:17 pm Radiologist:  CHRISTELLA. Frederic Specking, MD Guidance:  Ultrasound and fluoroscopic MEDICATIONS: 1% lidocaine  local with epinephrine  ANESTHESIA/SEDATION: Versed  2.5 mg IV; Fentanyl  125 mcg IV; Moderate Sedation Time:  29 minute The patient was continuously monitored during the procedure by the interventional radiology nurse under my direct supervision. FLUOROSCOPY: One minutes, 6 seconds (2 mGy) COMPLICATIONS: None immediate. CONTRAST:  None. PROCEDURE: Informed consent was obtained from the patient following explanation of the procedure,  risks, benefits and alternatives. The patient understands, agrees and consents for the procedure. All questions were addressed. A time out was performed. Maximal barrier sterile technique utilized including caps, mask, sterile gowns, sterile gloves, large sterile drape, hand hygiene, and 2% chlorhexidine  scrub. Under sterile conditions and local anesthesia, right internal jugular micropuncture venous access was performed. Access was performed with ultrasound. Images were obtained for documentation of the patent right internal jugular vein. A guide wire was inserted followed by a transitional dilator. This allowed insertion of a guide wire and catheter into the IVC. Measurements were obtained from the SVC / RA junction back to the right IJ venotomy site. In the right infraclavicular chest, a subcutaneous pocket was created over the second anterior rib. This was done under sterile conditions and local anesthesia. 1% lidocaine  with epinephrine  was utilized for this. A 2.5 cm incision was made in the skin. Blunt dissection was performed to create a subcutaneous pocket over the right pectoralis major  muscle. The pocket was flushed with saline vigorously. There was adequate hemostasis. The port catheter was assembled and checked for leakage. The port catheter was secured in the pocket with two retention sutures. The tubing was tunneled subcutaneously to the right venotomy site and inserted into the SVC/RA junction through a valved peel-away sheath. Position was confirmed with fluoroscopy. Images were obtained for documentation. The patient tolerated the procedure well. No immediate complications. Incisions were closed in a two layer fashion with 4 - 0 Vicryl suture. Dermabond was applied to the skin. The port catheter was accessed, blood was aspirated followed by saline and heparin  flushes. Needle was removed. A dry sterile dressing was applied. IMPRESSION: Ultrasound and fluoroscopically guided right internal jugular single lumen power port catheter insertion. Tip in the SVC/RA junction. Catheter ready for use. Electronically Signed   By: CHRISTELLA.  Shick M.D.   On: 09/04/2024 15:22     Assessment and plan- Patient is a 78 y.o. 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 Completed one cycle. Tolerating well with no significant side effects. No CT scans planned until after four cycles to assess surgical candidacy. - Continue gemcitabine  and cisplatin  chemotherapy regimen. - Plan CT [facility] after four cycles to evaluate surgical candidacy. - Coordinate with [facility] for CT scan scheduling after third cycle.  Anemia secondary to chemotherapy Hemoglobin at 10.7, slightly decreased. Mild anemia expected from chemotherapy. Normal WBC and platelets. Further evaluation if hemoglobin declines. - Monitor hemoglobin levels in subsequent labs. - Consider iron studies and B12  levels if hemoglobin continues to trend down.   Visit Diagnosis 1. Encounter for antineoplastic chemotherapy   2. Urothelial cancer (HCC)   3. Antineoplastic chemotherapy induced anemia      Dr. Annah Skene, MD, MPH [facility] [phone] 09/29/2024 8:59 AM

## 2024-10-01 ENCOUNTER — Other Ambulatory Visit: Payer: Self-pay

## 2024-10-06 ENCOUNTER — Encounter: Payer: Self-pay | Admitting: Oncology

## 2024-10-06 ENCOUNTER — Inpatient Hospital Stay

## 2024-10-06 VITALS — BP 141/71 | HR 78 | Temp 97.6°F | Resp 19

## 2024-10-06 DIAGNOSIS — Z5111 Encounter for antineoplastic chemotherapy: Secondary | ICD-10-CM | POA: Diagnosis not present

## 2024-10-06 DIAGNOSIS — C689 Malignant neoplasm of urinary organ, unspecified: Secondary | ICD-10-CM

## 2024-10-06 LAB — CBC WITH DIFFERENTIAL (CANCER CENTER ONLY)
Abs Immature Granulocytes: 0.09 K/uL — ABNORMAL HIGH (ref 0.00–0.07)
Basophils Absolute: 0 K/uL (ref 0.0–0.1)
Basophils Relative: 0 %
Eosinophils Absolute: 0 K/uL (ref 0.0–0.5)
Eosinophils Relative: 1 %
HCT: 32.1 % — ABNORMAL LOW (ref 36.0–46.0)
Hemoglobin: 10.5 g/dL — ABNORMAL LOW (ref 12.0–15.0)
Immature Granulocytes: 2 %
Lymphocytes Relative: 19 %
Lymphs Abs: 1.1 K/uL (ref 0.7–4.0)
MCH: 29.3 pg (ref 26.0–34.0)
MCHC: 32.7 g/dL (ref 30.0–36.0)
MCV: 89.7 fL (ref 80.0–100.0)
Monocytes Absolute: 0.4 K/uL (ref 0.1–1.0)
Monocytes Relative: 7 %
Neutro Abs: 4 K/uL (ref 1.7–7.7)
Neutrophils Relative %: 71 %
Platelet Count: 327 K/uL (ref 150–400)
RBC: 3.58 MIL/uL — ABNORMAL LOW (ref 3.87–5.11)
RDW: 16.2 % — ABNORMAL HIGH (ref 11.5–15.5)
WBC Count: 5.5 K/uL (ref 4.0–10.5)
nRBC: 0 % (ref 0.0–0.2)

## 2024-10-06 LAB — CMP (CANCER CENTER ONLY)
ALT: 16 U/L (ref 0–44)
AST: 21 U/L (ref 15–41)
Albumin: 3.8 g/dL (ref 3.5–5.0)
Alkaline Phosphatase: 68 U/L (ref 38–126)
Anion gap: 9 (ref 5–15)
BUN: 14 mg/dL (ref 8–23)
CO2: 26 mmol/L (ref 22–32)
Calcium: 8.6 mg/dL — ABNORMAL LOW (ref 8.9–10.3)
Chloride: 99 mmol/L (ref 98–111)
Creatinine: 0.75 mg/dL (ref 0.44–1.00)
GFR, Estimated: >60 mL/min (ref >60)
Glucose, Bld: 163 mg/dL — ABNORMAL HIGH (ref 70–99)
Potassium: 3.5 mmol/L (ref 3.5–5.1)
Sodium: 134 mmol/L — ABNORMAL LOW (ref 135–145)
Total Bilirubin: 0.6 mg/dL (ref 0.0–1.2)
Total Protein: 6 g/dL — ABNORMAL LOW (ref 6.5–8.1)

## 2024-10-06 LAB — MAGNESIUM: Magnesium: 1.7 mg/dL (ref 1.7–2.4)

## 2024-10-06 MED ORDER — POTASSIUM CHLORIDE IN NACL 20-0.9 MEQ/L-% IV SOLN
Freq: Once | INTRAVENOUS | Status: AC
  Filled 2024-10-06: qty 1000

## 2024-10-06 MED ORDER — ACETAMINOPHEN 325 MG PO TABS
650.0000 mg | ORAL_TABLET | Freq: Once | ORAL | Status: AC
  Administered 2024-10-06: 650 mg via ORAL
  Filled 2024-10-06: qty 2

## 2024-10-06 MED ORDER — SODIUM CHLORIDE 0.9 % IV SOLN
INTRAVENOUS | Status: DC
  Filled 2024-10-06: qty 250

## 2024-10-06 MED ORDER — SODIUM CHLORIDE 0.9 % IV SOLN
35.0000 mg/m2 | Freq: Once | INTRAVENOUS | Status: AC
  Administered 2024-10-06: 72 mg via INTRAVENOUS
  Filled 2024-10-06 (×2): qty 72

## 2024-10-06 MED ORDER — PALONOSETRON HCL INJECTION 0.25 MG/5ML
0.2500 mg | Freq: Once | INTRAVENOUS | Status: AC
  Administered 2024-10-06: 0.25 mg via INTRAVENOUS
  Filled 2024-10-06: qty 5

## 2024-10-06 MED ORDER — APREPITANT 130 MG/18ML IV EMUL
130.0000 mg | Freq: Once | INTRAVENOUS | Status: AC
  Administered 2024-10-06: 130 mg via INTRAVENOUS
  Filled 2024-10-06: qty 18

## 2024-10-06 MED ORDER — MAGNESIUM SULFATE 2 GM/50ML IV SOLN
2.0000 g | Freq: Once | INTRAVENOUS | Status: AC
  Administered 2024-10-06: 2 g via INTRAVENOUS
  Filled 2024-10-06: qty 50

## 2024-10-06 MED ORDER — SODIUM CHLORIDE 0.9 % IV SOLN
1000.0000 mg/m2 | Freq: Once | INTRAVENOUS | Status: AC
  Administered 2024-10-06: 2052 mg via INTRAVENOUS
  Filled 2024-10-06 (×3): qty 54

## 2024-10-06 MED ORDER — DEXAMETHASONE SOD PHOSPHATE PF 10 MG/ML IJ SOLN
10.0000 mg | Freq: Once | INTRAMUSCULAR | Status: AC
  Administered 2024-10-06: 10 mg via INTRAVENOUS

## 2024-10-09 ENCOUNTER — Inpatient Hospital Stay

## 2024-10-09 DIAGNOSIS — C689 Malignant neoplasm of urinary organ, unspecified: Secondary | ICD-10-CM

## 2024-10-09 DIAGNOSIS — Z5111 Encounter for antineoplastic chemotherapy: Secondary | ICD-10-CM | POA: Diagnosis not present

## 2024-10-09 DIAGNOSIS — E785 Hyperlipidemia, unspecified: Secondary | ICD-10-CM | POA: Diagnosis not present

## 2024-10-09 DIAGNOSIS — I10 Essential (primary) hypertension: Secondary | ICD-10-CM | POA: Diagnosis not present

## 2024-10-09 MED ORDER — PEGFILGRASTIM-CBQV 6 MG/0.6ML ~~LOC~~ SOSY
6.0000 mg | PREFILLED_SYRINGE | Freq: Once | SUBCUTANEOUS | Status: AC
  Administered 2024-10-09: 6 mg via SUBCUTANEOUS
  Filled 2024-10-09: qty 0.6

## 2024-10-16 DIAGNOSIS — E119 Type 2 diabetes mellitus without complications: Secondary | ICD-10-CM | POA: Diagnosis not present

## 2024-10-16 DIAGNOSIS — D649 Anemia, unspecified: Secondary | ICD-10-CM | POA: Diagnosis not present

## 2024-10-16 DIAGNOSIS — R600 Localized edema: Secondary | ICD-10-CM | POA: Diagnosis not present

## 2024-10-16 DIAGNOSIS — C661 Malignant neoplasm of right ureter: Secondary | ICD-10-CM | POA: Diagnosis not present

## 2024-10-16 DIAGNOSIS — I10 Essential (primary) hypertension: Secondary | ICD-10-CM | POA: Diagnosis not present

## 2024-10-16 DIAGNOSIS — Z0000 Encounter for general adult medical examination without abnormal findings: Secondary | ICD-10-CM | POA: Diagnosis not present

## 2024-10-16 DIAGNOSIS — G609 Hereditary and idiopathic neuropathy, unspecified: Secondary | ICD-10-CM | POA: Diagnosis not present

## 2024-10-16 DIAGNOSIS — E785 Hyperlipidemia, unspecified: Secondary | ICD-10-CM | POA: Diagnosis not present

## 2024-10-20 ENCOUNTER — Encounter: Payer: Self-pay | Admitting: Oncology

## 2024-10-20 ENCOUNTER — Inpatient Hospital Stay (HOSPITAL_BASED_OUTPATIENT_CLINIC_OR_DEPARTMENT_OTHER): Admitting: Oncology

## 2024-10-20 ENCOUNTER — Inpatient Hospital Stay

## 2024-10-20 ENCOUNTER — Inpatient Hospital Stay: Attending: Oncology

## 2024-10-20 VITALS — BP 127/61 | HR 73 | Temp 97.9°F | Resp 18 | Ht 65.0 in | Wt 201.0 lb

## 2024-10-20 DIAGNOSIS — Z79899 Other long term (current) drug therapy: Secondary | ICD-10-CM | POA: Insufficient documentation

## 2024-10-20 DIAGNOSIS — E785 Hyperlipidemia, unspecified: Secondary | ICD-10-CM | POA: Diagnosis not present

## 2024-10-20 DIAGNOSIS — Z8249 Family history of ischemic heart disease and other diseases of the circulatory system: Secondary | ICD-10-CM | POA: Diagnosis not present

## 2024-10-20 DIAGNOSIS — Z808 Family history of malignant neoplasm of other organs or systems: Secondary | ICD-10-CM | POA: Insufficient documentation

## 2024-10-20 DIAGNOSIS — Z86018 Personal history of other benign neoplasm: Secondary | ICD-10-CM | POA: Diagnosis not present

## 2024-10-20 DIAGNOSIS — Z5111 Encounter for antineoplastic chemotherapy: Secondary | ICD-10-CM | POA: Insufficient documentation

## 2024-10-20 DIAGNOSIS — Z923 Personal history of irradiation: Secondary | ICD-10-CM | POA: Diagnosis not present

## 2024-10-20 DIAGNOSIS — Z90710 Acquired absence of both cervix and uterus: Secondary | ICD-10-CM | POA: Insufficient documentation

## 2024-10-20 DIAGNOSIS — Z87891 Personal history of nicotine dependence: Secondary | ICD-10-CM | POA: Insufficient documentation

## 2024-10-20 DIAGNOSIS — R002 Palpitations: Secondary | ICD-10-CM | POA: Insufficient documentation

## 2024-10-20 DIAGNOSIS — I10 Essential (primary) hypertension: Secondary | ICD-10-CM | POA: Insufficient documentation

## 2024-10-20 DIAGNOSIS — Z9012 Acquired absence of left breast and nipple: Secondary | ICD-10-CM | POA: Diagnosis not present

## 2024-10-20 DIAGNOSIS — N132 Hydronephrosis with renal and ureteral calculous obstruction: Secondary | ICD-10-CM | POA: Diagnosis not present

## 2024-10-20 DIAGNOSIS — C689 Malignant neoplasm of urinary organ, unspecified: Secondary | ICD-10-CM

## 2024-10-20 DIAGNOSIS — Z8042 Family history of malignant neoplasm of prostate: Secondary | ICD-10-CM | POA: Insufficient documentation

## 2024-10-20 DIAGNOSIS — Z87442 Personal history of urinary calculi: Secondary | ICD-10-CM | POA: Diagnosis not present

## 2024-10-20 DIAGNOSIS — E119 Type 2 diabetes mellitus without complications: Secondary | ICD-10-CM | POA: Insufficient documentation

## 2024-10-20 DIAGNOSIS — I350 Nonrheumatic aortic (valve) stenosis: Secondary | ICD-10-CM | POA: Diagnosis not present

## 2024-10-20 DIAGNOSIS — Z59868 Other specified financial insecurity: Secondary | ICD-10-CM | POA: Insufficient documentation

## 2024-10-20 DIAGNOSIS — Z9049 Acquired absence of other specified parts of digestive tract: Secondary | ICD-10-CM | POA: Insufficient documentation

## 2024-10-20 DIAGNOSIS — Z5941 Food insecurity: Secondary | ICD-10-CM | POA: Diagnosis not present

## 2024-10-20 DIAGNOSIS — Z860101 Personal history of adenomatous and serrated colon polyps: Secondary | ICD-10-CM | POA: Insufficient documentation

## 2024-10-20 DIAGNOSIS — Z801 Family history of malignant neoplasm of trachea, bronchus and lung: Secondary | ICD-10-CM | POA: Insufficient documentation

## 2024-10-20 DIAGNOSIS — Z8673 Personal history of transient ischemic attack (TIA), and cerebral infarction without residual deficits: Secondary | ICD-10-CM | POA: Diagnosis not present

## 2024-10-20 DIAGNOSIS — G4733 Obstructive sleep apnea (adult) (pediatric): Secondary | ICD-10-CM | POA: Insufficient documentation

## 2024-10-20 DIAGNOSIS — R0789 Other chest pain: Secondary | ICD-10-CM | POA: Insufficient documentation

## 2024-10-20 LAB — CMP (CANCER CENTER ONLY)
ALT: 14 U/L (ref 0–44)
AST: 20 U/L (ref 15–41)
Albumin: 3.6 g/dL (ref 3.5–5.0)
Alkaline Phosphatase: 90 U/L (ref 38–126)
Anion gap: 8 (ref 5–15)
BUN: 13 mg/dL (ref 8–23)
CO2: 26 mmol/L (ref 22–32)
Calcium: 8.9 mg/dL (ref 8.9–10.3)
Chloride: 104 mmol/L (ref 98–111)
Creatinine: 0.64 mg/dL (ref 0.44–1.00)
GFR, Estimated: >60 mL/min (ref >60)
Glucose, Bld: 138 mg/dL — ABNORMAL HIGH (ref 70–99)
Potassium: 3.8 mmol/L (ref 3.5–5.1)
Sodium: 138 mmol/L (ref 135–145)
Total Bilirubin: 0.6 mg/dL (ref 0.0–1.2)
Total Protein: 5.9 g/dL — ABNORMAL LOW (ref 6.5–8.1)

## 2024-10-20 LAB — CBC WITH DIFFERENTIAL (CANCER CENTER ONLY)
Abs Immature Granulocytes: 0.09 K/uL — ABNORMAL HIGH (ref 0.00–0.07)
Basophils Absolute: 0 K/uL (ref 0.0–0.1)
Basophils Relative: 0 %
Eosinophils Absolute: 0.1 K/uL (ref 0.0–0.5)
Eosinophils Relative: 2 %
HCT: 31.6 % — ABNORMAL LOW (ref 36.0–46.0)
Hemoglobin: 10.2 g/dL — ABNORMAL LOW (ref 12.0–15.0)
Immature Granulocytes: 2 %
Lymphocytes Relative: 17 %
Lymphs Abs: 1 K/uL (ref 0.7–4.0)
MCH: 30 pg (ref 26.0–34.0)
MCHC: 32.3 g/dL (ref 30.0–36.0)
MCV: 92.9 fL (ref 80.0–100.0)
Monocytes Absolute: 0.6 K/uL (ref 0.1–1.0)
Monocytes Relative: 11 %
Neutro Abs: 4 K/uL (ref 1.7–7.7)
Neutrophils Relative %: 68 %
Platelet Count: 186 K/uL (ref 150–400)
RBC: 3.4 MIL/uL — ABNORMAL LOW (ref 3.87–5.11)
RDW: 18.9 % — ABNORMAL HIGH (ref 11.5–15.5)
WBC Count: 5.7 K/uL (ref 4.0–10.5)
nRBC: 0 % (ref 0.0–0.2)

## 2024-10-20 LAB — MAGNESIUM: Magnesium: 1.9 mg/dL (ref 1.7–2.4)

## 2024-10-20 MED ORDER — SODIUM CHLORIDE 0.9 % IV SOLN
1000.0000 mg/m2 | Freq: Once | INTRAVENOUS | Status: AC
  Administered 2024-10-20: 2052 mg via INTRAVENOUS
  Filled 2024-10-20: qty 53.97

## 2024-10-20 MED ORDER — PALONOSETRON HCL INJECTION 0.25 MG/5ML
0.2500 mg | Freq: Once | INTRAVENOUS | Status: AC
  Administered 2024-10-20: 0.25 mg via INTRAVENOUS
  Filled 2024-10-20: qty 5

## 2024-10-20 MED ORDER — SODIUM CHLORIDE 0.9 % IV SOLN
INTRAVENOUS | Status: DC
  Filled 2024-10-20: qty 250

## 2024-10-20 MED ORDER — SODIUM CHLORIDE 0.9 % IV SOLN
35.0000 mg/m2 | Freq: Once | INTRAVENOUS | Status: AC
  Administered 2024-10-20: 72 mg via INTRAVENOUS
  Filled 2024-10-20: qty 72

## 2024-10-20 MED ORDER — POTASSIUM CHLORIDE IN NACL 20-0.9 MEQ/L-% IV SOLN
Freq: Once | INTRAVENOUS | Status: AC
  Filled 2024-10-20: qty 1000

## 2024-10-20 MED ORDER — DEXAMETHASONE SOD PHOSPHATE PF 10 MG/ML IJ SOLN
10.0000 mg | Freq: Once | INTRAMUSCULAR | Status: AC
  Administered 2024-10-20: 10 mg via INTRAVENOUS

## 2024-10-20 MED ORDER — APREPITANT 130 MG/18ML IV EMUL
130.0000 mg | Freq: Once | INTRAVENOUS | Status: AC
  Administered 2024-10-20: 130 mg via INTRAVENOUS
  Filled 2024-10-20: qty 18

## 2024-10-20 MED ORDER — MAGNESIUM SULFATE 2 GM/50ML IV SOLN
2.0000 g | Freq: Once | INTRAVENOUS | Status: AC
  Administered 2024-10-20: 2 g via INTRAVENOUS
  Filled 2024-10-20: qty 50

## 2024-10-20 NOTE — Progress Notes (Signed)
 Patient states she's has some tightening in her chest, along with some flutters that seems to start doing the evening time.

## 2024-10-20 NOTE — Patient Instructions (Signed)
 [facility] - A DEPT OF [place]  Discharge Instructions: Thank you for choosing [facility] to provide your oncology and hematology care.  If you have a lab appointment with the Cancer Center, please go directly to the Cancer Center and check in at the registration area.  Wear comfortable clothing and clothing appropriate for easy access to any Portacath or PICC line.   We strive to give you quality time with your provider. You may need to reschedule your appointment if you arrive late (15 or more minutes).  Arriving late affects you and other patients whose appointments are after yours.  Also, if you miss three or more appointments without notifying the office, you may be dismissed from the clinic at the provider's discretion.      For prescription refill requests, have your pharmacy contact our office and allow 72 hours for refills to be completed.    Today you received the following chemotherapy and/or immunotherapy agents Cisplatin  and Gemzar .      To help prevent nausea and vomiting after your treatment, we encourage you to take your nausea medication as directed.  BELOW ARE SYMPTOMS THAT SHOULD BE REPORTED IMMEDIATELY: *FEVER GREATER THAN 100.4 F (38 C) OR HIGHER *CHILLS OR SWEATING *NAUSEA AND VOMITING THAT IS NOT CONTROLLED WITH YOUR NAUSEA MEDICATION *UNUSUAL SHORTNESS OF BREATH *UNUSUAL BRUISING OR BLEEDING *URINARY PROBLEMS (pain or burning when urinating, or frequent urination) *BOWEL PROBLEMS (unusual diarrhea, constipation, pain near the anus) TENDERNESS IN MOUTH AND THROAT WITH OR WITHOUT PRESENCE OF ULCERS (sore throat, sores in mouth, or a toothache) UNUSUAL RASH, SWELLING OR PAIN  UNUSUAL VAGINAL DISCHARGE OR ITCHING   Items with * indicate a potential emergency and should be followed up as soon as possible or go to the Emergency Department if any problems should occur.  Please show the CHEMOTHERAPY ALERT CARD or  IMMUNOTHERAPY ALERT CARD at check-in to the Emergency Department and triage nurse.  Should you have questions after your visit or need to cancel or reschedule your appointment, please contact [facility] - A DEPT OF [facility]  [phone] and follow the prompts.  Office hours are 8:00 a.m. to 4:30 p.m. Monday - Friday. Please note that voicemails left after 4:00 p.m. may not be returned until the following business day.  We are closed weekends and major holidays. You have access to a nurse at all times for urgent questions. Please call the main number to the clinic [phone] and follow the prompts.  For any non-urgent questions, you may also contact your provider using MyChart. We now offer e-Visits for anyone 8 and older to request care online for non-urgent symptoms. For details visit [email or web address].   Also download the MyChart app! Go to the app store, search MyChart, open the app, select [place], and log in with your MyChart username and password.

## 2024-10-20 NOTE — Progress Notes (Signed)
 Hematology/Oncology Consult note [facility]  Telephone:([phone] [phone]  Patient Care Team: Valora Lynwood FALCON, MD as PCP - General (Family Medicine)   Name of the patient: Jennifer Maddox  [id number]  1946-10-14   Date of visit: 10/20/24  Diagnosis-  Cancer Staging  Breast cancer, left breast (HCC) Staging form: Breast, AJCC 7th Edition - Clinical: No stage assigned - Unsigned - Pathologic: Stage IA (T1b, N0, cM0) - Signed by Lionell Sonny FALCON, NP on 02/18/2016 Laterality: Left Tumor grade (Scarff-Bloom-Richardson system): G2 Estrogen receptor status: Positive Progesterone receptor status: Positive HER2 status: Negative - Clinical stage from 12/08/2018: Stage IA (T1a, N0, M0) - Signed by Melanee Annah BROCKS, MD on 12/08/2018 Histologic grade (G): G2 Estrogen receptor status: Positive Progesterone receptor status: Positive HER2 status: Equivocal    Chief complaint/ Reason for visit-on treatment assessment prior to cycle 3-day 1 of gemcitabine  cisplatin  chemotherapy  Heme/Onc history: Patient is a 78 year old female who was diagnosed with breast cancer both in 2011 right breast cancer and 2019 left breast cancer stage I ER/PR positive HER2 negative.  Most recently patient completed 5 years of Arimidex  in March 2025 after left mastectomy.  She did not require postmastectomy radiation on the left side.   Patient was found to have hematuria and was seen by urology Dr. Twylla.  She underwent CT abdomen pelvis with contrast in June 2025 which showed a 6 mm ureteral stone and associated moderate right hydronephrosis.  She underwent right ureteral stone extraction and at that time was noted to have abnormal appearing ureteral mucosa proximal to the stone.  This was biopsied and was consistent with high-grade papillary urothelial carcinoma.  She also underwent ureteroscopy on 07/31/2024 which showed a 5 cm area of right ureteral narrowing and was unable to be  traversed with the ureteroscope.  CT chest without contrast in July 2025 did not show any evidence of distant metastatic disease.  She was evaluated by [facility] urology as well as medical oncology.  Neoadjuvant gemcitabine  and cisplatin  chemotherapy was recommended for suspected T3 disease for 4 cycles followed by consideration for right nephroureterectomy.  Interval history- Discussed the use of AI scribe software for clinical note transcription with the patient, who gave verbal consent to proceed.  Jennifer Maddox is an 78 year old female with urothelial cancer who presents for cycle three, day one of chemotherapy.  She is currently undergoing chemotherapy for urothelial cancer and is on cycle three, day one of her treatment. She is currently in her third cycle and plans to finish four cycles. Her chemotherapy schedule includes treatments on November 7th, November 14th, and a break on November 21st, with the final cycle starting on December 5th and ending on December 12th.  She experiences intermittent chest tightness and fluttering, which occurred during her last chemotherapy cycle. The chest tightness happened a couple of times, and she described the fluttering as occurring 'just for a little while.' She has a known heart murmur and is under the care of a cardiologist at [facility], with her next appointment scheduled for early next month.      ECOG PS- 1 Pain scale- 0   Review of systems- Review of Systems  Constitutional:  Negative for chills, fever, malaise/fatigue and weight loss.  HENT:  Negative for congestion, ear discharge and nosebleeds.   Eyes:  Negative for blurred vision.  Respiratory:  Negative for cough, hemoptysis, sputum production, shortness of breath and wheezing.   Cardiovascular:  Positive for palpitations.  Negative for chest pain, orthopnea and claudication.  Gastrointestinal:  Negative for abdominal pain, blood in stool, constipation, diarrhea, heartburn, melena, nausea and vomiting.   Genitourinary:  Negative for dysuria, flank pain, frequency, hematuria and urgency.  Musculoskeletal:  Negative for back pain, joint pain and myalgias.  Skin:  Negative for rash.  Neurological:  Negative for dizziness, tingling, focal weakness, seizures, weakness and headaches.  Endo/Heme/Allergies:  Does not bruise/bleed easily.  Psychiatric/Behavioral:  Negative for depression and suicidal ideas. The patient does not have insomnia.       Allergies  Allergen Reactions   Atorvastatin Other (See Comments)    Cramps   Naproxen Sodium Other (See Comments)    Head and Neck Puritis     Past Medical History:  Diagnosis Date   Adenoma of left adrenal gland    Anemia    Aortic atherosclerosis    Aortic valve stenosis    Arthritis    Bilateral renal cysts    Breast cancer, left (HCC)    Colon polyps    COPD (chronic obstructive pulmonary disease) (HCC)    DDD (degenerative disc disease), thoracolumbar    Degenerative disc disease, lumbar    Diverticulosis    DM (diabetes mellitus), type 2 (HCC)    Dyspnea    History of kidney stones    Hyperlipidemia    Hypertension    Insomnia    a.) uses melatonin + trazodone  PRN   Irritable bowel syndrome with both constipation and diarrhea    Long-term use of aspirin therapy    NAFLD (nonalcoholic fatty liver disease)    Obesity    OSA on CPAP    Peripheral edema    Personal history of radiation therapy    Pulmonary hypertension (HCC)    Right inguinal hernia    TIA (transient ischemic attack) 06/2007     Past Surgical History:  Procedure Laterality Date   ABDOMINAL HYSTERECTOMY     around age 41 ; ovaries intact   BREAST BIOPSY Left 12/01/2018   Pathology LEFT breast mass 11 o'clock location: Invasive mammary   BREAST EXCISIONAL BIOPSY Left 2011   breast ca with radation   CHOLECYSTECTOMY     COLONOSCOPY W/ POLYPECTOMY     COLONOSCOPY WITH PROPOFOL  N/A 10/29/2017   Procedure: COLONOSCOPY WITH PROPOFOL ;  Surgeon: Viktoria Lamar DASEN, MD;  Location: [facility] ENDOSCOPY;  Service: Endoscopy;  Laterality: N/A;   COLONOSCOPY WITH PROPOFOL  N/A 07/24/2022   Procedure: COLONOSCOPY WITH PROPOFOL ;  Surgeon: Maryruth Ole DASEN, MD;  Location: [facility] ENDOSCOPY;  Service: Endoscopy;  Laterality: N/A;   CYSTOSCOPY/URETEROSCOPY/HOLMIUM LASER/STENT PLACEMENT Right 06/06/2024   Procedure: CYSTOSCOPY/URETEROSCOPY/HOLMIUM LASER/STENT PLACEMENT;  Surgeon: Twylla Glendia BROCKS, MD;  Location: [facility] ORS;  Service: Urology;  Laterality: Right;   ESOPHAGOGASTRODUODENOSCOPY (EGD) WITH PROPOFOL  N/A 10/29/2017   Procedure: ESOPHAGOGASTRODUODENOSCOPY (EGD) WITH PROPOFOL ;  Surgeon: Viktoria Lamar DASEN, MD;  Location: [facility] ENDOSCOPY;  Service: Endoscopy;  Laterality: N/A;   IR IMAGING GUIDED PORT INSERTION  09/04/2024   KNEE ARTHROSCOPY W/ MENISCAL REPAIR Right    ROTATOR CUFF REPAIR Right 12/2023   SENTINEL NODE BIOPSY Left 01/17/2019   Procedure: SENTINEL NODE INJECTION;  Surgeon: Tye Millet, DO;  Location: [facility] ORS;  Service: General;  Laterality: Left;   TONSILLECTOMY     TOTAL MASTECTOMY Left 01/17/2019   Procedure: TOTAL MASTECTOMY;  Surgeon: Tye Millet, DO;  Location: [facility] ORS;  Service: General;  Laterality: Left;   URETERAL BIOPSY Right 06/06/2024   Procedure: BIOPSY, URETER;  Surgeon: Twylla Glendia  C, MD;  Location: [facility] ORS;  Service: Urology;  Laterality: Right;    Social History   Socioeconomic History   Marital status: Married    Spouse name: Not on file   Number of children: Not on file   Years of education: Not on file   Highest education level: Not on file  Occupational History   Not on file  Tobacco Use   Smoking status: Former    Current packs/day: 0.00    Types: Cigarettes    Quit date: 12/14/1977    Years since quitting: 46.8   Smokeless tobacco: Never  Vaping Use   Vaping status: Never Used  Substance and Sexual Activity   Alcohol use: No   Drug use: No   Sexual activity: Not on file  Other Topics Concern   Not  on file  Social History Narrative   Not on file   Social Drivers of Health   Financial Resource Strain: Medium Risk (10/15/2024)   Received from [facility]   Overall Financial Resource Strain (CARDIA)    Difficulty of Paying Living Expenses: Somewhat hard  Food Insecurity: Food Insecurity Present (10/15/2024)   Received from [facility]   Hunger Vital Sign    Within the past 12 months, you worried that your food would run out before you got the money to buy more.: Sometimes true    Within the past 12 months, the food you bought just didn't last and you didn't have money to get more.: Sometimes true  Transportation Needs: No Transportation Needs (10/15/2024)   Received from [facility]    In the past 12 months, has lack of transportation kept you from medical appointments or from getting medications?: No    Lack of Transportation (Non-Medical): No  Physical Activity: Not on file  Stress: Not on file  Social Connections: Not on file  Intimate Partner Violence: Not At Risk (06/26/2024)   Received from [facility]   Humiliation, Afraid, Rape, and Kick questionnaire    Within the last year, have you been afraid of your partner or ex-partner?: No    Within the last year, have you been humiliated or emotionally abused in other ways by your partner or ex-partner?: No    Within the last year, have you been kicked, hit, slapped, or otherwise physically hurt by your partner or ex-partner?: No    Within the last year, have you been raped or forced to have any kind of sexual activity by your partner or ex-partner?: No    Family History  Problem Relation Age of Onset   Lung cancer Mother        smoker; deceased 85   Heart attack Father        deceased 16   Melanoma Brother 68       currently 31   Prostate cancer Brother 7       currently 80   Melanoma Son 45       below left armpit; currently 31   Breast  cancer Neg Hx      Current Outpatient Medications:    amLODipine (NORVASC) 2.5 MG tablet, Take 2.5 mg by mouth., Disp: , Rfl:    blood glucose meter kit and supplies, USE TWICE DAILY, Disp: , Rfl:    cyanocobalamin  (VITAMIN B12) 1000 MCG tablet, Take 2,000 mcg by mouth daily., Disp: , Rfl:    dexamethasone  (DECADRON ) 4 MG tablet, Take 2 tablets (  8 mg) by mouth daily x 3 days starting the day after cisplatin  chemotherapy. Take with food., Disp: 30 tablet, Rfl: 1   docusate sodium  (COLACE) 100 MG capsule, Take 100 mg by mouth daily., Disp: , Rfl:    furosemide  (LASIX ) 20 MG tablet, Take 20 mg by mouth daily., Disp: , Rfl:    gabapentin  (NEURONTIN ) 300 MG capsule, Take 300 mg by mouth at bedtime. , Disp: , Rfl:    glucose blood test strip, , Disp: , Rfl:    HYDROcodone -acetaminophen  (NORCO/VICODIN) 5-325 MG tablet, Take 0.5-1 tablets by mouth every 6 (six) hours as needed for moderate pain (pain score 4-6)., Disp: 8 tablet, Rfl: 0   lidocaine -prilocaine  (EMLA ) cream, Apply to affected area once, Disp: 30 g, Rfl: 3   magnesium  oxide (MAG-OX) 400 (240 Mg) MG tablet, Take 400 mg by mouth daily., Disp: , Rfl:    MELATONIN PO, Take 12 mg by mouth at bedtime. , Disp: , Rfl:    Methylcellulose, Laxative, (CITRUCEL PO), Take 2 tablets by mouth daily., Disp: , Rfl:    Multiple Vitamins-Minerals (MULTIVITAMIN ADULT PO), Take 1 tablet by mouth daily. , Disp: , Rfl:    ondansetron  (ZOFRAN ) 8 MG tablet, Take 1 tablet (8 mg total) by mouth every 8 (eight) hours as needed for nausea or vomiting. Start on the third day after cisplatin ., Disp: 30 tablet, Rfl: 1   OZEMPIC, 2 MG/DOSE, 8 MG/3ML SOPN, Inject 1 mg into the skin once a week., Disp: , Rfl:    pantoprazole (PROTONIX) 20 MG tablet, Take 1 tablet (20 mg total) by mouth daily., Disp: 30 tablet, Rfl: 2   pioglitazone (ACTOS) 30 MG tablet, Take 30 mg by mouth daily., Disp: , Rfl:    potassium chloride  (KLOR-CON  M10) 10 MEQ tablet, Take 1 tablet by mouth  daily., Disp: , Rfl:    prochlorperazine  (COMPAZINE ) 10 MG tablet, Take 1 tablet (10 mg total) by mouth every 6 (six) hours as needed (Nausea or vomiting)., Disp: 30 tablet, Rfl: 1   rosuvastatin (CRESTOR) 40 MG tablet, Take 40 mg by mouth at bedtime., Disp: , Rfl:    traZODone  (DESYREL ) 50 MG tablet, Take 50 mg by mouth at bedtime., Disp: , Rfl:    trospium  (SANCTURA ) 20 MG tablet, Take 1 tablet (20 mg total) by mouth 2 (two) times daily as needed (frequency,urgency,bladder spasm)., Disp: 20 tablet, Rfl: 0   TRUEplus Lancets 28G MISC, TEST BLOOD SUGAR AS DIRECTED, Disp: , Rfl:    aspirin EC 81 MG tablet, Take 81 mg by mouth daily. Swallow whole. (Patient not taking: Reported on 09/29/2024), Disp: , Rfl:  No current facility-administered medications for this visit.  Facility-Administered Medications Ordered in Other Visits:    0.9 %  sodium chloride  infusion, , Intravenous, Continuous, Melanee Annah BROCKS, MD, Last Rate: 10 mL/hr at 10/20/24 0953, New Bag at 10/20/24 0953   CISplatin  (PLATINOL ) 72 mg in sodium chloride  0.9 % 250 mL chemo infusion, 35 mg/m2 (Treatment Plan Recorded), Intravenous, Once, Melanee Annah BROCKS, MD  Physical exam:  Vitals:   10/20/24 0859  BP: 127/61  Pulse: 73  Resp: 18  Temp: 97.9 F (36.6 C)  TempSrc: Tympanic  SpO2: 100%  Weight: 201 lb (91.2 kg)  Height: 5' 5 (1.651 m)   Physical Exam Cardiovascular:     Rate and Rhythm: Normal rate and regular rhythm.     Heart sounds: Normal heart sounds.  Pulmonary:     Effort: Pulmonary effort is normal.  Breath sounds: Normal breath sounds.  Skin:    General: Skin is warm and dry.  Neurological:     Mental Status: She is alert and oriented to person, place, and time.      I have personally reviewed labs listed below:    Latest Ref Rng & Units 10/20/2024    8:35 AM  CMP  Glucose 70 - 99 mg/dL 861   BUN 8 - 23 mg/dL 13   Creatinine 9.55 - 1.00 mg/dL 9.35   Sodium 864 - 854 mmol/L 138   Potassium 3.5 - 5.1  mmol/L 3.8   Chloride 98 - 111 mmol/L 104   CO2 22 - 32 mmol/L 26   Calcium 8.9 - 10.3 mg/dL 8.9   Total Protein 6.5 - 8.1 g/dL 5.9   Total Bilirubin 0.0 - 1.2 mg/dL 0.6   Alkaline Phos 38 - 126 U/L 90   AST 15 - 41 U/L 20   ALT 0 - 44 U/L 14       Latest Ref Rng & Units 10/20/2024    8:35 AM  CBC  WBC 4.0 - 10.5 K/uL 5.7   Hemoglobin 12.0 - 15.0 g/dL 89.7   Hematocrit 63.9 - 46.0 % 31.6   Platelets 150 - 400 K/uL 186      Assessment and plan- Patient is a 78 y.o. 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  She is here for on treatment assessment prior to cycle 3-day 1 of neoadjuvant gemcitabine  cisplatin  chemotherapy     Urothelial cancer of urinary tract on chemotherapy Undergoing chemotherapy, cycle three initiated. Plan for four cycles, last on December 12th. Post-chemotherapy surgery considered.  - Schedule CT scan post-December 12th. - Coordinate with [facility] for post-chemotherapy evaluation and potential surgery.  Cardiac murmur with recent chest tightness and palpitations Intermittent chest tightness and palpitations reported. Known cardiac murmur. Cardiology follow-up scheduled. Advised to discuss symptoms with cardiologist due to surgery plans. - Contact cardiologist regarding chest tightness and palpitations. - Complete cardiac evaluation before potential surgery.  - Patient will need to go to the ER if she has recurrence of chest pain symptoms        Visit Diagnosis 1. Urothelial cancer (HCC)   2. Encounter for antineoplastic chemotherapy      Dr. Annah Skene, MD, MPH [facility] [phone] 10/20/2024 12:32 PM

## 2024-10-22 ENCOUNTER — Other Ambulatory Visit: Payer: Self-pay

## 2024-10-26 ENCOUNTER — Other Ambulatory Visit: Payer: Self-pay

## 2024-10-26 DIAGNOSIS — R5383 Other fatigue: Secondary | ICD-10-CM | POA: Diagnosis not present

## 2024-10-26 DIAGNOSIS — G4733 Obstructive sleep apnea (adult) (pediatric): Secondary | ICD-10-CM | POA: Diagnosis not present

## 2024-10-26 DIAGNOSIS — I350 Nonrheumatic aortic (valve) stenosis: Secondary | ICD-10-CM | POA: Diagnosis not present

## 2024-10-26 DIAGNOSIS — I10 Essential (primary) hypertension: Secondary | ICD-10-CM | POA: Diagnosis not present

## 2024-10-26 DIAGNOSIS — R079 Chest pain, unspecified: Secondary | ICD-10-CM | POA: Diagnosis not present

## 2024-10-26 DIAGNOSIS — Z01810 Encounter for preprocedural cardiovascular examination: Secondary | ICD-10-CM | POA: Diagnosis not present

## 2024-10-26 DIAGNOSIS — R0789 Other chest pain: Secondary | ICD-10-CM | POA: Diagnosis not present

## 2024-10-26 DIAGNOSIS — E785 Hyperlipidemia, unspecified: Secondary | ICD-10-CM | POA: Diagnosis not present

## 2024-10-26 DIAGNOSIS — E119 Type 2 diabetes mellitus without complications: Secondary | ICD-10-CM | POA: Diagnosis not present

## 2024-10-26 DIAGNOSIS — I498 Other specified cardiac arrhythmias: Secondary | ICD-10-CM | POA: Diagnosis not present

## 2024-10-26 DIAGNOSIS — Z7969 Long term (current) use of other immunomodulators and immunosuppressants: Secondary | ICD-10-CM | POA: Diagnosis not present

## 2024-10-27 ENCOUNTER — Inpatient Hospital Stay

## 2024-10-27 VITALS — BP 139/54 | HR 70 | Temp 96.1°F | Resp 18 | Ht 65.0 in | Wt 202.9 lb

## 2024-10-27 DIAGNOSIS — Z5111 Encounter for antineoplastic chemotherapy: Secondary | ICD-10-CM | POA: Diagnosis not present

## 2024-10-27 DIAGNOSIS — C689 Malignant neoplasm of urinary organ, unspecified: Secondary | ICD-10-CM

## 2024-10-27 LAB — CBC WITH DIFFERENTIAL (CANCER CENTER ONLY)
Abs Immature Granulocytes: 0.02 K/uL (ref 0.00–0.07)
Basophils Absolute: 0 K/uL (ref 0.0–0.1)
Basophils Relative: 0 %
Eosinophils Absolute: 0 K/uL (ref 0.0–0.5)
Eosinophils Relative: 1 %
HCT: 29.6 % — ABNORMAL LOW (ref 36.0–46.0)
Hemoglobin: 9.6 g/dL — ABNORMAL LOW (ref 12.0–15.0)
Immature Granulocytes: 1 %
Lymphocytes Relative: 21 %
Lymphs Abs: 0.9 K/uL (ref 0.7–4.0)
MCH: 30.2 pg (ref 26.0–34.0)
MCHC: 32.4 g/dL (ref 30.0–36.0)
MCV: 93.1 fL (ref 80.0–100.0)
Monocytes Absolute: 0.3 K/uL (ref 0.1–1.0)
Monocytes Relative: 6 %
Neutro Abs: 3.1 K/uL (ref 1.7–7.7)
Neutrophils Relative %: 71 %
Platelet Count: 210 K/uL (ref 150–400)
RBC: 3.18 MIL/uL — ABNORMAL LOW (ref 3.87–5.11)
RDW: 17.8 % — ABNORMAL HIGH (ref 11.5–15.5)
WBC Count: 4.3 K/uL (ref 4.0–10.5)
nRBC: 0 % (ref 0.0–0.2)

## 2024-10-27 LAB — CMP (CANCER CENTER ONLY)
ALT: 16 U/L (ref 0–44)
AST: 20 U/L (ref 15–41)
Albumin: 3.5 g/dL (ref 3.5–5.0)
Alkaline Phosphatase: 71 U/L (ref 38–126)
Anion gap: 8 (ref 5–15)
BUN: 14 mg/dL (ref 8–23)
CO2: 27 mmol/L (ref 22–32)
Calcium: 8.6 mg/dL — ABNORMAL LOW (ref 8.9–10.3)
Chloride: 101 mmol/L (ref 98–111)
Creatinine: 0.54 mg/dL (ref 0.44–1.00)
GFR, Estimated: >60 mL/min (ref >60)
Glucose, Bld: 127 mg/dL — ABNORMAL HIGH (ref 70–99)
Potassium: 3.6 mmol/L (ref 3.5–5.1)
Sodium: 136 mmol/L (ref 135–145)
Total Bilirubin: 0.7 mg/dL (ref 0.0–1.2)
Total Protein: 5.7 g/dL — ABNORMAL LOW (ref 6.5–8.1)

## 2024-10-27 LAB — MAGNESIUM: Magnesium: 1.5 mg/dL — ABNORMAL LOW (ref 1.7–2.4)

## 2024-10-27 MED ORDER — POTASSIUM CHLORIDE IN NACL 20-0.9 MEQ/L-% IV SOLN
Freq: Once | INTRAVENOUS | Status: AC
  Filled 2024-10-27: qty 1000

## 2024-10-27 MED ORDER — PALONOSETRON HCL INJECTION 0.25 MG/5ML
0.2500 mg | Freq: Once | INTRAVENOUS | Status: AC
  Administered 2024-10-27: 0.25 mg via INTRAVENOUS
  Filled 2024-10-27: qty 5

## 2024-10-27 MED ORDER — SODIUM CHLORIDE 0.9 % IV SOLN
35.0000 mg/m2 | Freq: Once | INTRAVENOUS | Status: AC
  Administered 2024-10-27: 72 mg via INTRAVENOUS
  Filled 2024-10-27: qty 72

## 2024-10-27 MED ORDER — MAGNESIUM SULFATE 2 GM/50ML IV SOLN
2.0000 g | Freq: Once | INTRAVENOUS | Status: AC
  Administered 2024-10-27: 2 g via INTRAVENOUS
  Filled 2024-10-27: qty 50

## 2024-10-27 MED ORDER — DEXAMETHASONE SOD PHOSPHATE PF 10 MG/ML IJ SOLN
10.0000 mg | Freq: Once | INTRAMUSCULAR | Status: AC
  Administered 2024-10-27: 10 mg via INTRAVENOUS

## 2024-10-27 MED ORDER — SODIUM CHLORIDE 0.9 % IV SOLN
INTRAVENOUS | Status: AC
  Filled 2024-10-27: qty 250

## 2024-10-27 MED ORDER — APREPITANT 130 MG/18ML IV EMUL
130.0000 mg | Freq: Once | INTRAVENOUS | Status: AC
  Administered 2024-10-27: 130 mg via INTRAVENOUS
  Filled 2024-10-27: qty 18

## 2024-10-27 MED ORDER — SODIUM CHLORIDE 0.9 % IV SOLN
1000.0000 mg/m2 | Freq: Once | INTRAVENOUS | Status: AC
  Administered 2024-10-27: 2052 mg via INTRAVENOUS
  Filled 2024-10-27: qty 53.97

## 2024-10-27 NOTE — Addendum Note (Signed)
 Addended by: JENI LONGS A on: 10/27/2024 09:36 AM   Modules accepted: Orders

## 2024-10-27 NOTE — Patient Instructions (Signed)
 Magnesium  Citrate Solution What is this medication? MAGNESIUM  CITRATE (mag NEE zee um SI treyt) treats occasional constipation. It works by increasing the amount of water  your intestine absorbs. This softens the stool, making it easier to have a bowel movement. It also increases pressure, which prompts the muscles in your intestines to move stool. It belongs to a group of medications called laxatives. This medicine may be used for other purposes; ask your health care provider or pharmacist if you have questions. COMMON BRAND NAME(S): Citroma, OneLAX What should I tell my care team before I take this medication? They need to know if you have any of these conditions: Are on a low magnesium  or low sodium diet Change in bowel habits for 2 weeks Colostomy or ileostomy Constipation after using another laxative for 7 days Diabetes Kidney disease Rectal bleeding Stomach pain, nausea, or vomiting An unusual or allergic reaction to magnesium  citrate, other magnesium  products, other medications, foods, dyes, or preservatives Pregnant or trying to get pregnant Breast-feeding How should I use this medication? Take this medication by mouth. Follow the directions on the package or prescription label. Use a specially marked spoon or container to measure each dose. Ask your pharmacist if you do not have one. Household spoons are not accurate. Drink a full glass of fluid with each dose of this medication. This medication may taste better if it is chilled before you drink it. Do not take your medication more often than directed. Talk to your care team about the use of this medication in children. While this medication may be prescribed for children as young as 17 years of age for selected conditions, precautions do apply. Overdosage: If you think you have taken too much of this medicine contact a poison control center or emergency room at once. NOTE: This medicine is only for you. Do not share this medicine with  others. What if I miss a dose? This does not apply; this medication is not for regular use. What may interact with this medication? Cellulose sodium phosphate  Digoxin Edetate disodium, EDTA Medications for bone strength like etidronate, ibandronate, risedronate Sodium polystyrene sulfonate Some antibiotics like ciprofloxacin, doxycycline, gatifloxacin, levofloxacin, tetracycline Vitamin D This list may not describe all possible interactions. Give your health care provider a list of all the medicines, herbs, non-prescription drugs, or dietary supplements you use. Also tell them if you smoke, drink alcohol, or use illegal drugs. Some items may interact with your medicine. What should I watch for while using this medication? Tell your care team if your symptoms do not start to get better or if they get worse. Do not take any other medication by mouth within 2 hours of taking this medication. What side effects may I notice from receiving this medication? Side effects that you should report to your care team as soon as possible: Allergic reactions--skin rash, itching, hives, swelling of the face, lips, tongue, or throat High magnesium  level--confusion, drowsiness, facial flushing, redness, sweating, muscle weakness, fast or irregular heartbeat, trouble breathing Side effects that usually do not require medical attention (report to your care team if they continue or are bothersome): Diarrhea Nausea Stomach pain Vomiting This list may not describe all possible side effects. Call your doctor for medical advice about side effects. You may report side effects to FDA at 1-800-FDA-1088. Where should I keep my medication? Keep out of the reach of children. Store at room temperature or in the refrigerator between 8 and 30 degrees C (46 and 86 degrees F). Throw away  any unused medication 24 hours after opening the bottle. Throw away unopened bottles of medication after the expiration date. NOTE: This  sheet is a summary. It may not cover all possible information. If you have questions about this medicine, talk to your doctor, pharmacist, or health care provider.  2024 Elsevier/Gold Standard (2021-10-16 00:00:00)Cisplatin  Injection What is this medication? CISPLATIN  (SIS pla tin) treats some types of cancer. It works by slowing down the growth of cancer cells. This medicine may be used for other purposes; ask your health care provider or pharmacist if you have questions. COMMON BRAND NAME(S): Platinol , Platinol  -AQ What should I tell my care team before I take this medication? They need to know if you have any of these conditions: Eye disease, vision problems Hearing problems Kidney disease Low blood counts, such as low white cells, platelets, or red blood cells Tingling of the fingers or toes, or other nerve disorder An unusual or allergic reaction to cisplatin , carboplatin, oxaliplatin, other medications, foods, dyes, or preservatives If you or your partner are pregnant or trying to get pregnant Breast-feeding How should I use this medication? This medication is injected into a vein. It is given by your care team in a hospital or clinic setting. Talk to your care team about the use of this medication in children. Special care may be needed. Overdosage: If you think you have taken too much of this medicine contact a poison control center or emergency room at once. NOTE: This medicine is only for you. Do not share this medicine with others. What if I miss a dose? Keep appointments for follow-up doses. It is important not to miss your dose. Call your care team if you are unable to keep an appointment. What may interact with this medication? Do not take this medication with any of the following: Live virus vaccines This medication may also interact with the following: Certain antibiotics, such as amikacin, gentamicin, neomycin, polymyxin B, streptomycin, tobramycin,  vancomycin Foscarnet This list may not describe all possible interactions. Give your health care provider a list of all the medicines, herbs, non-prescription drugs, or dietary supplements you use. Also tell them if you smoke, drink alcohol, or use illegal drugs. Some items may interact with your medicine. What should I watch for while using this medication? Your condition will be monitored carefully while you are receiving this medication. You may need blood work done while taking this medication. This medication may make you feel generally unwell. This is not uncommon, as chemotherapy can affect healthy cells as well as cancer cells. Report any side effects. Continue your course of treatment even though you feel ill unless your care team tells you to stop. This medication may increase your risk of getting an infection. Call your care team for advice if you get a fever, chills, sore throat, or other symptoms of a cold or flu. Do not treat yourself. Try to avoid being around people who are sick. Avoid taking medications that contain aspirin, acetaminophen , ibuprofen , naproxen, or ketoprofen unless instructed by your care team. These medications may hide a fever. This medication may increase your risk to bruise or bleed. Call your care team if you notice any unusual bleeding. Be careful brushing or flossing your teeth or using a toothpick because you may get an infection or bleed more easily. If you have any dental work done, tell your dentist you are receiving this medication. Drink fluids as directed while you are taking this medication. This will help protect your kidneys.  Call your care team if you get diarrhea. Do not treat yourself. Talk to your care team if you or your partner wish to become pregnant or think you might be pregnant. This medication can cause serious birth defects if taken during pregnancy and for 14 months after the last dose. A negative pregnancy test is required before starting  this medication. A reliable form of contraception is recommended while taking this medication and for 14 months after the last dose. Talk to your care team about effective forms of contraception. Do not father a child while taking this medication and for 11 months after the last dose. Use a condom during sex during this time period. Do not breast-feed while taking this medication. This medication may cause infertility. Talk to your care team if you are concerned about your fertility. What side effects may I notice from receiving this medication? Side effects that you should report to your care team as soon as possible: Allergic reactions--skin rash, itching, hives, swelling of the face, lips, tongue, or throat Eye pain, change in vision, vision loss Hearing loss, ringing in ears Infection--fever, chills, cough, sore throat, wounds that don't heal, pain or trouble when passing urine, general feeling of discomfort or being unwell Kidney injury--decrease in the amount of urine, swelling of the ankles, hands, or feet Low red blood cell level--unusual weakness or fatigue, dizziness, headache, trouble breathing Painful swelling, warmth, or redness of the skin, blisters or sores at the infusion site Pain, tingling, or numbness in the hands or feet Unusual bruising or bleeding Side effects that usually do not require medical attention (report to your care team if they continue or are bothersome): Hair loss Nausea Vomiting This list may not describe all possible side effects. Call your doctor for medical advice about side effects. You may report side effects to FDA at 1-800-FDA-1088. Where should I keep my medication? This medication is given in a hospital or clinic. It will not be stored at home. NOTE: This sheet is a summary. It may not cover all possible information. If you have questions about this medicine, talk to your doctor, pharmacist, or health care provider.  2024 Elsevier/Gold Standard  (2022-04-03 00:00:00)Gemcitabine  Injection What is this medication? GEMCITABINE  (jem SYE ta been) treats some types of cancer. It works by slowing down the growth of cancer cells. This medicine may be used for other purposes; ask your health care provider or pharmacist if you have questions. COMMON BRAND NAME(S): Gemzar , Infugem  What should I tell my care team before I take this medication? They need to know if you have any of these conditions: Blood disorders Infection Kidney disease Liver disease Lung or breathing disease, such as asthma or COPD Recent or ongoing radiation therapy An unusual or allergic reaction to gemcitabine , other medications, foods, dyes, or preservatives If you or your partner are pregnant or trying to get pregnant Breast-feeding How should I use this medication? This medication is injected into a vein. It is given by your care team in a hospital or clinic setting. Talk to your care team about the use of this medication in children. Special care may be needed. Overdosage: If you think you have taken too much of this medicine contact a poison control center or emergency room at once. NOTE: This medicine is only for you. Do not share this medicine with others. What if I miss a dose? Keep appointments for follow-up doses. It is important not to miss your dose. Call your care team if you are  unable to keep an appointment. What may interact with this medication? Interactions have not been studied. This list may not describe all possible interactions. Give your health care provider a list of all the medicines, herbs, non-prescription drugs, or dietary supplements you use. Also tell them if you smoke, drink alcohol, or use illegal drugs. Some items may interact with your medicine. What should I watch for while using this medication? Your condition will be monitored carefully while you are receiving this medication. This medication may make you feel generally unwell. This  is not uncommon, as chemotherapy can affect healthy cells as well as cancer cells. Report any side effects. Continue your course of treatment even though you feel ill unless your care team tells you to stop. In some cases, you may be given additional medications to help with side effects. Follow all directions for their use. This medication may increase your risk of getting an infection. Call your care team for advice if you get a fever, chills, sore throat, or other symptoms of a cold or flu. Do not treat yourself. Try to avoid being around people who are sick. This medication may increase your risk to bruise or bleed. Call your care team if you notice any unusual bleeding. Be careful brushing or flossing your teeth or using a toothpick because you may get an infection or bleed more easily. If you have any dental work done, tell your dentist you are receiving this medication. Avoid taking medications that contain aspirin, acetaminophen , ibuprofen , naproxen, or ketoprofen unless instructed by your care team. These medications may hide a fever. Talk to your care team if you or your partner wish to become pregnant or think you might be pregnant. This medication can cause serious birth defects if taken during pregnancy and for 6 months after the last dose. A negative pregnancy test is required before starting this medication. A reliable form of contraception is recommended while taking this medication and for 6 months after the last dose. Talk to your care team about effective forms of contraception. Do not father a child while taking this medication and for 3 months after the last dose. Use a condom while having sex during this time period. Do not breastfeed while taking this medication and for at least 1 week after the last dose. This medication may cause infertility. Talk to your care team if you are concerned about your fertility. What side effects may I notice from receiving this medication? Side effects  that you should report to your care team as soon as possible: Allergic reactions--skin rash, itching, hives, swelling of the face, lips, tongue, or throat Capillary leak syndrome--stomach or muscle pain, unusual weakness or fatigue, feeling faint or lightheaded, decrease in the amount of urine, swelling of the ankles, hands, or feet, trouble breathing Infection--fever, chills, cough, sore throat, wounds that don't heal, pain or trouble when passing urine, general feeling of discomfort or being unwell Liver injury--right upper belly pain, loss of appetite, nausea, light-colored stool, dark yellow or brown urine, yellowing skin or eyes, unusual weakness or fatigue Low red blood cell level--unusual weakness or fatigue, dizziness, headache, trouble breathing Lung injury--shortness of breath or trouble breathing, cough, spitting up blood, chest pain, fever Stomach pain, bloody diarrhea, pale skin, unusual weakness or fatigue, decrease in the amount of urine, which may be signs of hemolytic uremic syndrome Sudden and severe headache, confusion, change in vision, seizures, which may be signs of posterior reversible encephalopathy syndrome (PRES) Unusual bruising or bleeding Side effects  that usually do not require medical attention (report to your care team if they continue or are bothersome): Diarrhea Drowsiness Hair loss Nausea Pain, redness, or swelling with sores inside the mouth or throat Vomiting This list may not describe all possible side effects. Call your doctor for medical advice about side effects. You may report side effects to FDA at 1-800-FDA-1088. Where should I keep my medication? This medication is given in a hospital or clinic. It will not be stored at home. NOTE: This sheet is a summary. It may not cover all possible information. If you have questions about this medicine, talk to your doctor, pharmacist, or health care provider.  2024 Elsevier/Gold Standard (2022-04-07 00:00:00)

## 2024-10-27 NOTE — Progress Notes (Signed)
-------------------------------------------------------------------------------   Summary: Navigation Note -------------------------------------------------------------------------------  Post NAC imaging ordered/requested for 12/08/24 at [facility], if available (closest to pt). Med onc/Surg Onc rtc visits requested for 12/29.   Delon Lunger RN, BSN Oncology Nurse Navigator

## 2024-10-30 ENCOUNTER — Inpatient Hospital Stay

## 2024-10-30 DIAGNOSIS — C689 Malignant neoplasm of urinary organ, unspecified: Secondary | ICD-10-CM

## 2024-10-30 DIAGNOSIS — Z5111 Encounter for antineoplastic chemotherapy: Secondary | ICD-10-CM | POA: Diagnosis not present

## 2024-10-30 MED ORDER — PEGFILGRASTIM-CBQV 6 MG/0.6ML ~~LOC~~ SOSY
6.0000 mg | PREFILLED_SYRINGE | Freq: Once | SUBCUTANEOUS | Status: AC
  Administered 2024-10-30: 6 mg via SUBCUTANEOUS
  Filled 2024-10-30: qty 0.6

## 2024-11-01 ENCOUNTER — Other Ambulatory Visit: Payer: Self-pay | Admitting: Family Medicine

## 2024-11-01 DIAGNOSIS — Z1231 Encounter for screening mammogram for malignant neoplasm of breast: Secondary | ICD-10-CM

## 2024-11-02 ENCOUNTER — Other Ambulatory Visit: Payer: Self-pay

## 2024-11-15 ENCOUNTER — Other Ambulatory Visit: Payer: Self-pay

## 2024-11-15 ENCOUNTER — Emergency Department
Admission: EM | Admit: 2024-11-15 | Discharge: 2024-11-15 | Disposition: A | Discharge status: Home / Self Care | Attending: Emergency Medicine | Admitting: Emergency Medicine

## 2024-11-15 ENCOUNTER — Emergency Department

## 2024-11-15 ENCOUNTER — Encounter: Payer: Self-pay | Admitting: Intensive Care

## 2024-11-15 DIAGNOSIS — I10 Essential (primary) hypertension: Secondary | ICD-10-CM | POA: Diagnosis not present

## 2024-11-15 DIAGNOSIS — J449 Chronic obstructive pulmonary disease, unspecified: Secondary | ICD-10-CM | POA: Diagnosis not present

## 2024-11-15 DIAGNOSIS — W19XXXA Unspecified fall, initial encounter: Secondary | ICD-10-CM

## 2024-11-15 DIAGNOSIS — E119 Type 2 diabetes mellitus without complications: Secondary | ICD-10-CM | POA: Diagnosis not present

## 2024-11-15 DIAGNOSIS — M1711 Unilateral primary osteoarthritis, right knee: Secondary | ICD-10-CM | POA: Diagnosis not present

## 2024-11-15 DIAGNOSIS — M25561 Pain in right knee: Secondary | ICD-10-CM | POA: Diagnosis not present

## 2024-11-15 DIAGNOSIS — Z853 Personal history of malignant neoplasm of breast: Secondary | ICD-10-CM | POA: Insufficient documentation

## 2024-11-15 DIAGNOSIS — W1839XA Other fall on same level, initial encounter: Secondary | ICD-10-CM | POA: Diagnosis not present

## 2024-11-15 DIAGNOSIS — R609 Edema, unspecified: Secondary | ICD-10-CM | POA: Diagnosis not present

## 2024-11-15 MED ORDER — KETOROLAC TROMETHAMINE 30 MG/ML IJ SOLN
15.0000 mg | Freq: Once | INTRAMUSCULAR | Status: AC
  Administered 2024-11-15: 15 mg via INTRAMUSCULAR
  Filled 2024-11-15: qty 1

## 2024-11-15 NOTE — Discharge Instructions (Signed)
 Your exam and XR are normal and reassuring following your fall. There is no XR evidence of any fracture or dislocation. Rest with the leg elevated and apply ice to help reduce pain.  Take OTC Tylenol  or your prescription pain medicine as needed.

## 2024-11-15 NOTE — ED Triage Notes (Addendum)
 Patient brought in by EMS from [facility]. Reports mechanical fall.Landed on right knee.  Denies hitting head. Denies blood thinners. Denies LOC  A&O x4 in triage   Reports she receives chemo for kidney cancer. Last chemo treatment over a month ago

## 2024-11-15 NOTE — ED Notes (Signed)
 See triage note  Presents s/p fall  States had mechanical fall  landed on right knee

## 2024-11-15 NOTE — ED Provider Notes (Signed)
 [facility] Emergency Department Provider Note     Event Date/Time   First MD Initiated Contact with Patient 11/15/24 1005     (approximate)   History   Fall   HPI  Jennifer Maddox is a 78 y.o. female with a history of breast cancer, right ureter carcinoma undergoing chemo, HTN, HLD, COPD, DM type II, OSA, DDD, and lymphedema, presents to the ED via EMS.  Patient presents from [facility], following a mechanical fall.  Patient apparently landed primarily on her knee on the right.  No reports of any head injury, LOC, or blood thinner use.  Patient is endorsing pain at this time out of 10 but denies any need for ED provided pain medicine at this time.    Physical Exam   Triage Vital Signs: ED Triage Vitals  Encounter Vitals Group     BP 11/15/24 0950 (!) 180/80     Girls Systolic BP Percentile --      Girls Diastolic BP Percentile --      Boys Systolic BP Percentile --      Boys Diastolic BP Percentile --      Pulse Rate 11/15/24 0950 82     Resp 11/15/24 0950 16     Temp 11/15/24 0950 97.6 F (36.4 C)     Temp Source 11/15/24 0950 Oral     SpO2 11/15/24 0950 98 %     Weight 11/15/24 0948 202 lb 14.9 oz (92 kg)     Height 11/15/24 0948 5' 5 (1.651 m)     Head Circumference --      Peak Flow --      Pain Score 11/15/24 0947 7     Pain Loc --      Pain Education --      Exclude from Growth Chart --     Most recent vital signs: Vitals:   11/15/24 0950  BP: (!) 180/80  Pulse: 82  Resp: 16  Temp: 97.6 F (36.4 C)  SpO2: 98%    General Awake, no distress. NAD HEENT NCAT. PERRL. EOMI. No rhinorrhea. Mucous membranes are moist.  CV:  Good peripheral perfusion. Stable, chronic BLE edema RESP:  Normal effort.  MSK:  Right knee without obvious deformity, dislocation, or joint effusion.  Tender to palpation over the anterior patella.  No popliteal space fullness is noted.  No Q tenderness noted distally.   ED Results / Procedures  / Treatments   Labs (all labs ordered are listed, but only abnormal results are displayed) Labs Reviewed - No data to display   EKG   RADIOLOGY  I personally viewed and evaluated these images as part of my medical decision making, as well as reviewing the written report by the radiologist.  ED Provider Interpretation: No acute findings  DG Knee Complete 4 Views Right Result Date: 11/15/2024 CLINICAL DATA:  Knee pain, fall EXAM: RIGHT KNEE - COMPLETE 4+ VIEW COMPARISON:  04/03/2008 FINDINGS: Right knee tricompartmental osteoarthritis with joint space loss, sclerosis and osteophyte formation. Medial compartment most affected. Normal alignment without acute osseous finding, fracture, or large effusion. Extremity subcutaneous edema noted. Obese body habitus. IMPRESSION: Right knee tricompartmental osteoarthritis. No acute finding by plain radiography. Electronically Signed   By: CHRISTELLA.  Shick M.D.   On: 11/15/2024 10:54     PROCEDURES:  Critical Care performed: No  Procedures   MEDICATIONS ORDERED IN ED: Medications  ketorolac  (TORADOL ) 30 MG/ML injection 15 mg (15 mg Intramuscular Given  11/15/24 1140)     IMPRESSION / MDM / ASSESSMENT AND PLAN / ED COURSE  I reviewed the triage vital signs and the nursing notes.                              Differential diagnosis includes, but is not limited to, contusion, fracture, dislocation, DJD, sprain, strain, tendinitis, myalgias  Patient's presentation is most consistent with acute complicated illness / injury requiring diagnostic workup.  Patient's diagnosis is consistent with mechanical fall resulting in right knee pain.  Patient presents to the ED following mechanical fall.  Right knee exam reveals no fracture or dislocation.  X-rays interpreted by me, confirm no underlying arthropathy.  Severe tricompartmental arthritis is appreciated.  Exam without evidence of acute joint effusion or evidence of internal derangement.  Patient  requesting injection pain medicine at the time of discharge.  Patient will be discharged home with instructions to take her home meds as prescribed.  Patient is to follow up with her PCP as needed or otherwise directed. Patient is given ED precautions to return to the ED for any worsening or new symptoms.   FINAL CLINICAL IMPRESSION(S) / ED DIAGNOSES   Final diagnoses:  Fall, initial encounter  Acute pain of right knee     Rx / DC Orders   ED Discharge Orders     None        Note:  This document was prepared using Dragon voice recognition software and may include unintentional dictation errors.    Loyd Candida LULLA Aldona, PA-C 11/15/24 1304    Viviann Pastor, MD 11/15/24 [phone]

## 2024-11-17 ENCOUNTER — Inpatient Hospital Stay: Attending: Oncology | Admitting: Oncology

## 2024-11-17 ENCOUNTER — Encounter: Payer: Self-pay | Admitting: Oncology

## 2024-11-17 ENCOUNTER — Inpatient Hospital Stay: Attending: Oncology

## 2024-11-17 ENCOUNTER — Inpatient Hospital Stay

## 2024-11-17 VITALS — BP 138/57

## 2024-11-17 VITALS — BP 137/66 | HR 86 | Temp 97.7°F | Resp 16 | Wt 210.0 lb

## 2024-11-17 DIAGNOSIS — C50912 Malignant neoplasm of unspecified site of left female breast: Secondary | ICD-10-CM

## 2024-11-17 DIAGNOSIS — Z90710 Acquired absence of both cervix and uterus: Secondary | ICD-10-CM | POA: Insufficient documentation

## 2024-11-17 DIAGNOSIS — Z860101 Personal history of adenomatous and serrated colon polyps: Secondary | ICD-10-CM | POA: Diagnosis not present

## 2024-11-17 DIAGNOSIS — Z7952 Long term (current) use of systemic steroids: Secondary | ICD-10-CM | POA: Diagnosis not present

## 2024-11-17 DIAGNOSIS — Z1732 Human epidermal growth factor receptor 2 negative status: Secondary | ICD-10-CM | POA: Insufficient documentation

## 2024-11-17 DIAGNOSIS — Z8042 Family history of malignant neoplasm of prostate: Secondary | ICD-10-CM | POA: Insufficient documentation

## 2024-11-17 DIAGNOSIS — Z87442 Personal history of urinary calculi: Secondary | ICD-10-CM | POA: Insufficient documentation

## 2024-11-17 DIAGNOSIS — Z801 Family history of malignant neoplasm of trachea, bronchus and lung: Secondary | ICD-10-CM | POA: Insufficient documentation

## 2024-11-17 DIAGNOSIS — E119 Type 2 diabetes mellitus without complications: Secondary | ICD-10-CM | POA: Insufficient documentation

## 2024-11-17 DIAGNOSIS — Z59868 Other specified financial insecurity: Secondary | ICD-10-CM | POA: Diagnosis not present

## 2024-11-17 DIAGNOSIS — R609 Edema, unspecified: Secondary | ICD-10-CM | POA: Diagnosis not present

## 2024-11-17 DIAGNOSIS — Z8249 Family history of ischemic heart disease and other diseases of the circulatory system: Secondary | ICD-10-CM | POA: Insufficient documentation

## 2024-11-17 DIAGNOSIS — Z87891 Personal history of nicotine dependence: Secondary | ICD-10-CM | POA: Insufficient documentation

## 2024-11-17 DIAGNOSIS — I350 Nonrheumatic aortic (valve) stenosis: Secondary | ICD-10-CM | POA: Diagnosis not present

## 2024-11-17 DIAGNOSIS — Z5941 Food insecurity: Secondary | ICD-10-CM | POA: Diagnosis not present

## 2024-11-17 DIAGNOSIS — K589 Irritable bowel syndrome without diarrhea: Secondary | ICD-10-CM | POA: Insufficient documentation

## 2024-11-17 DIAGNOSIS — Z170 Estrogen receptor positive status [ER+]: Secondary | ICD-10-CM | POA: Insufficient documentation

## 2024-11-17 DIAGNOSIS — N132 Hydronephrosis with renal and ureteral calculous obstruction: Secondary | ICD-10-CM | POA: Insufficient documentation

## 2024-11-17 DIAGNOSIS — Z9049 Acquired absence of other specified parts of digestive tract: Secondary | ICD-10-CM | POA: Insufficient documentation

## 2024-11-17 DIAGNOSIS — Z1721 Progesterone receptor positive status: Secondary | ICD-10-CM | POA: Insufficient documentation

## 2024-11-17 DIAGNOSIS — Z9012 Acquired absence of left breast and nipple: Secondary | ICD-10-CM | POA: Insufficient documentation

## 2024-11-17 DIAGNOSIS — E669 Obesity, unspecified: Secondary | ICD-10-CM | POA: Insufficient documentation

## 2024-11-17 DIAGNOSIS — I10 Essential (primary) hypertension: Secondary | ICD-10-CM | POA: Insufficient documentation

## 2024-11-17 DIAGNOSIS — Z808 Family history of malignant neoplasm of other organs or systems: Secondary | ICD-10-CM | POA: Insufficient documentation

## 2024-11-17 DIAGNOSIS — Z79899 Other long term (current) drug therapy: Secondary | ICD-10-CM | POA: Diagnosis not present

## 2024-11-17 DIAGNOSIS — M1711 Unilateral primary osteoarthritis, right knee: Secondary | ICD-10-CM | POA: Insufficient documentation

## 2024-11-17 DIAGNOSIS — Z86018 Personal history of other benign neoplasm: Secondary | ICD-10-CM | POA: Diagnosis not present

## 2024-11-17 DIAGNOSIS — C689 Malignant neoplasm of urinary organ, unspecified: Secondary | ICD-10-CM

## 2024-11-17 DIAGNOSIS — I272 Pulmonary hypertension, unspecified: Secondary | ICD-10-CM | POA: Diagnosis not present

## 2024-11-17 DIAGNOSIS — Z8673 Personal history of transient ischemic attack (TIA), and cerebral infarction without residual deficits: Secondary | ICD-10-CM | POA: Insufficient documentation

## 2024-11-17 DIAGNOSIS — Z5111 Encounter for antineoplastic chemotherapy: Secondary | ICD-10-CM | POA: Diagnosis not present

## 2024-11-17 DIAGNOSIS — Z7982 Long term (current) use of aspirin: Secondary | ICD-10-CM | POA: Insufficient documentation

## 2024-11-17 DIAGNOSIS — E785 Hyperlipidemia, unspecified: Secondary | ICD-10-CM | POA: Insufficient documentation

## 2024-11-17 DIAGNOSIS — Z9221 Personal history of antineoplastic chemotherapy: Secondary | ICD-10-CM | POA: Insufficient documentation

## 2024-11-17 DIAGNOSIS — Z923 Personal history of irradiation: Secondary | ICD-10-CM | POA: Insufficient documentation

## 2024-11-17 LAB — CBC WITH DIFFERENTIAL (CANCER CENTER ONLY)
Abs Immature Granulocytes: 0.05 K/uL (ref 0.00–0.07)
Basophils Absolute: 0 K/uL (ref 0.0–0.1)
Basophils Relative: 0 %
Eosinophils Absolute: 0.1 K/uL (ref 0.0–0.5)
Eosinophils Relative: 2 %
HCT: 30.4 % — ABNORMAL LOW (ref 36.0–46.0)
Hemoglobin: 9.8 g/dL — ABNORMAL LOW (ref 12.0–15.0)
Immature Granulocytes: 1 %
Lymphocytes Relative: 11 %
Lymphs Abs: 0.7 K/uL (ref 0.7–4.0)
MCH: 30.9 pg (ref 26.0–34.0)
MCHC: 32.2 g/dL (ref 30.0–36.0)
MCV: 95.9 fL (ref 80.0–100.0)
Monocytes Absolute: 1 K/uL (ref 0.1–1.0)
Monocytes Relative: 17 %
Neutro Abs: 4.2 K/uL (ref 1.7–7.7)
Neutrophils Relative %: 69 %
Platelet Count: 291 K/uL (ref 150–400)
RBC: 3.17 MIL/uL — ABNORMAL LOW (ref 3.87–5.11)
RDW: 19.8 % — ABNORMAL HIGH (ref 11.5–15.5)
WBC Count: 6 K/uL (ref 4.0–10.5)
nRBC: 0 % (ref 0.0–0.2)

## 2024-11-17 LAB — MAGNESIUM: Magnesium: 1.6 mg/dL — ABNORMAL LOW (ref 1.7–2.4)

## 2024-11-17 LAB — CMP (CANCER CENTER ONLY)
ALT: 10 U/L (ref 0–44)
AST: 17 U/L (ref 15–41)
Albumin: 4.1 g/dL (ref 3.5–5.0)
Alkaline Phosphatase: 73 U/L (ref 38–126)
Anion gap: 10 (ref 5–15)
BUN: 15 mg/dL (ref 8–23)
CO2: 27 mmol/L (ref 22–32)
Calcium: 9.4 mg/dL (ref 8.9–10.3)
Chloride: 104 mmol/L (ref 98–111)
Creatinine: 0.71 mg/dL (ref 0.44–1.00)
GFR, Estimated: >60 mL/min (ref >60)
Glucose, Bld: 154 mg/dL — ABNORMAL HIGH (ref 70–99)
Potassium: 3.6 mmol/L (ref 3.5–5.1)
Sodium: 142 mmol/L (ref 135–145)
Total Bilirubin: 0.4 mg/dL (ref 0.0–1.2)
Total Protein: 6 g/dL — ABNORMAL LOW (ref 6.5–8.1)

## 2024-11-17 MED ORDER — SODIUM CHLORIDE 0.9 % IV SOLN
1000.0000 mg/m2 | Freq: Once | INTRAVENOUS | Status: AC
  Administered 2024-11-17: 2052 mg via INTRAVENOUS
  Filled 2024-11-17: qty 53.97

## 2024-11-17 MED ORDER — DEXAMETHASONE SOD PHOSPHATE PF 10 MG/ML IJ SOLN
10.0000 mg | Freq: Once | INTRAMUSCULAR | Status: AC
  Administered 2024-11-17: 10 mg via INTRAVENOUS

## 2024-11-17 MED ORDER — PALONOSETRON HCL INJECTION 0.25 MG/5ML
0.2500 mg | Freq: Once | INTRAVENOUS | Status: AC
  Administered 2024-11-17: 0.25 mg via INTRAVENOUS
  Filled 2024-11-17: qty 5

## 2024-11-17 MED ORDER — POTASSIUM CHLORIDE IN NACL 20-0.9 MEQ/L-% IV SOLN: Freq: Once | INTRAVENOUS | Status: AC

## 2024-11-17 MED ORDER — SODIUM CHLORIDE 0.9 % IV SOLN
INTRAVENOUS | Status: DC
  Filled 2024-11-17: qty 250

## 2024-11-17 MED ORDER — MAGNESIUM SULFATE 2 GM/50ML IV SOLN
2.0000 g | Freq: Once | INTRAVENOUS | Status: AC
  Administered 2024-11-17: 2 g via INTRAVENOUS
  Filled 2024-11-17: qty 50

## 2024-11-17 MED ORDER — APREPITANT 130 MG/18ML IV EMUL
130.0000 mg | Freq: Once | INTRAVENOUS | Status: AC
  Administered 2024-11-17: 130 mg via INTRAVENOUS
  Filled 2024-11-17: qty 18

## 2024-11-17 MED ORDER — SODIUM CHLORIDE 0.9 % IV SOLN
35.0000 mg/m2 | Freq: Once | INTRAVENOUS | Status: AC
  Administered 2024-11-17: 72 mg via INTRAVENOUS
  Filled 2024-11-17: qty 72

## 2024-11-17 NOTE — Progress Notes (Signed)
 Hematology/Oncology Consult note [facility]  Telephone:([phone] [phone]  Patient Care Team: Valora Lynwood FALCON, MD as PCP - General (Family Medicine)   Name of the patient: Jennifer Maddox  [id number]  17-Oct-1946   Date of visit: 11/17/24  Diagnosis- 1.  History of breast cancer 2.  Upper urothelial tract high-grade urothelial carcinoma T3 disease  Chief complaint/ Reason for visit-on treatment assessment prior to cycle 4-day 1 of gemcitabine  cisplatin  chemotherapy  Heme/Onc history:  Patient is a 78 year old female who was diagnosed with breast cancer both in 2011 right breast cancer and 2019 left breast cancer stage I ER/PR positive HER2 negative.  Most recently patient completed 5 years of Arimidex  in March 2025 after left mastectomy.  She did not require postmastectomy radiation on the left side.   Patient was found to have hematuria and was seen by urology Dr. Twylla.  She underwent CT abdomen pelvis with contrast in June 2025 which showed a 6 mm ureteral stone and associated moderate right hydronephrosis.  She underwent right ureteral stone extraction and at that time was noted to have abnormal appearing ureteral mucosa proximal to the stone.  This was biopsied and was consistent with high-grade papillary urothelial carcinoma.  She also underwent ureteroscopy on 07/31/2024 which showed a 5 cm area of right ureteral narrowing and was unable to be traversed with the ureteroscope.  CT chest without contrast in July 2025 did not show any evidence of distant metastatic disease.  She was evaluated by [facility] as well as medical oncology.  Neoadjuvant gemcitabine  and cisplatin  chemotherapy was recommended for suspected T3 disease for 4 cycles followed by consideration for right nephroureterectomy.    Interval history-patient has been evaluated by cardiology for symptoms of chest tightness and fluttering.  They will be giving her a cardiac monitor soon  and also has a stress test planned for next week.  Overall she is tolerating treatment well so far without any significant side effects  ECOG PS- 1 Pain scale- 0   Review of systems- Review of Systems  Constitutional:  Positive for malaise/fatigue. Negative for chills, fever and weight loss.  HENT:  Negative for congestion, ear discharge and nosebleeds.   Eyes:  Negative for blurred vision.  Respiratory:  Negative for cough, hemoptysis, sputum production, shortness of breath and wheezing.   Cardiovascular:  Negative for chest pain, palpitations, orthopnea and claudication.  Gastrointestinal:  Negative for abdominal pain, blood in stool, constipation, diarrhea, heartburn, melena, nausea and vomiting.  Genitourinary:  Negative for dysuria, flank pain, frequency, hematuria and urgency.  Musculoskeletal:  Negative for back pain, joint pain and myalgias.  Skin:  Negative for rash.  Neurological:  Negative for dizziness, tingling, focal weakness, seizures, weakness and headaches.  Endo/Heme/Allergies:  Does not bruise/bleed easily.  Psychiatric/Behavioral:  Negative for depression and suicidal ideas. The patient does not have insomnia.       Allergies  Allergen Reactions   Atorvastatin Other (See Comments)    Cramps   Naproxen Sodium Other (See Comments)    Head and Neck Puritis     Past Medical History:  Diagnosis Date   Adenoma of left adrenal gland    Anemia    Aortic atherosclerosis    Aortic valve stenosis    Arthritis    Bilateral renal cysts    Breast cancer, left (HCC)    Colon polyps    COPD (chronic obstructive pulmonary disease) (HCC)    DDD (degenerative disc disease), thoracolumbar  Degenerative disc disease, lumbar    Diverticulosis    DM (diabetes mellitus), type 2 (HCC)    Dyspnea    History of kidney stones    Hyperlipidemia    Hypertension    Insomnia    a.) uses melatonin + trazodone  PRN   Irritable bowel syndrome with both constipation and diarrhea     Long-term use of aspirin therapy    NAFLD (nonalcoholic fatty liver disease)    Obesity    OSA on CPAP    Peripheral edema    Personal history of radiation therapy    Pulmonary hypertension (HCC)    Right inguinal hernia    TIA (transient ischemic attack) 06/2007     Past Surgical History:  Procedure Laterality Date   ABDOMINAL HYSTERECTOMY     around age 39 ; ovaries intact   BREAST BIOPSY Left 12/01/2018   Pathology LEFT breast mass 11 o'clock location: Invasive mammary   BREAST EXCISIONAL BIOPSY Left 2011   breast ca with radation   CHOLECYSTECTOMY     COLONOSCOPY W/ POLYPECTOMY     COLONOSCOPY WITH PROPOFOL  N/A 10/29/2017   Procedure: COLONOSCOPY WITH PROPOFOL ;  Surgeon: Viktoria Lamar DASEN, MD;  Location: [facility] ENDOSCOPY;  Service: Endoscopy;  Laterality: N/A;   COLONOSCOPY WITH PROPOFOL  N/A 07/24/2022   Procedure: COLONOSCOPY WITH PROPOFOL ;  Surgeon: Maryruth Ole DASEN, MD;  Location: [facility] ENDOSCOPY;  Service: Endoscopy;  Laterality: N/A;   CYSTOSCOPY/URETEROSCOPY/HOLMIUM LASER/STENT PLACEMENT Right 06/06/2024   Procedure: CYSTOSCOPY/URETEROSCOPY/HOLMIUM LASER/STENT PLACEMENT;  Surgeon: Twylla Glendia BROCKS, MD;  Location: [facility] ORS;  Service: Urology;  Laterality: Right;   ESOPHAGOGASTRODUODENOSCOPY (EGD) WITH PROPOFOL  N/A 10/29/2017   Procedure: ESOPHAGOGASTRODUODENOSCOPY (EGD) WITH PROPOFOL ;  Surgeon: Viktoria Lamar DASEN, MD;  Location[facility] ENDOSCOPY;  Service: Endoscopy;  Laterality: N/A;   IR IMAGING GUIDED PORT INSERTION  09/04/2024   KNEE ARTHROSCOPY W/ MENISCAL REPAIR Right    ROTATOR CUFF REPAIR Right 12/2023   SENTINEL NODE BIOPSY Left 01/17/2019   Procedure: SENTINEL NODE INJECTION;  Surgeon: Tye Millet, DO;  Location: [facility] ORS;  Service: General;  Laterality: Left;   TONSILLECTOMY     TOTAL MASTECTOMY Left 01/17/2019   Procedure: TOTAL MASTECTOMY;  Surgeon: Tye Millet, DO;  Location: [facility] ORS;  Service: General;  Laterality: Left;   URETERAL BIOPSY Right 06/06/2024    Procedure: BIOPSY, URETER;  Surgeon: Twylla Glendia BROCKS, MD;  Location: [facility] ORS;  Service: Urology;  Laterality: Right;    Social History   Socioeconomic History   Marital status: Married    Spouse name: Not on file   Number of children: Not on file   Years of education: Not on file   Highest education level: Not on file  Occupational History   Not on file  Tobacco Use   Smoking status: Former    Current packs/day: 0.00    Types: Cigarettes    Quit date: 12/14/1977    Years since quitting: 46.9   Smokeless tobacco: Never  Vaping Use   Vaping status: Never Used  Substance and Sexual Activity   Alcohol use: No   Drug use: No   Sexual activity: Not on file  Other Topics Concern   Not on file  Social History Narrative   Not on file   Social Drivers of Health   Financial Resource Strain: Medium Risk (10/15/2024)   Received from [facility]   Overall Financial Resource Strain (CARDIA)    Difficulty of Paying Living Expenses: Somewhat hard  Food Insecurity: Food Insecurity  Present (10/15/2024)   Received from [facility]   Hunger Vital Sign    Within the past 12 months, you worried that your food would run out before you got the money to buy more.: Sometimes true    Within the past 12 months, the food you bought just didn't last and you didn't have money to get more.: Sometimes true  Transportation Needs: No Transportation Needs (10/15/2024)   Received from [facility] - Transportation    In the past 12 months, has lack of transportation kept you from medical appointments or from getting medications?: No    Lack of Transportation (Non-Medical): No  Physical Activity: Not on file  Stress: Not on file  Social Connections: Not on file  Intimate Partner Violence: Not At Risk (06/26/2024)   Received from [facility]   Humiliation, Afraid, Rape, and Kick questionnaire    Within the last year, have you been afraid  of your partner or ex-partner?: No    Within the last year, have you been humiliated or emotionally abused in other ways by your partner or ex-partner?: No    Within the last year, have you been kicked, hit, slapped, or otherwise physically hurt by your partner or ex-partner?: No    Within the last year, have you been raped or forced to have any kind of sexual activity by your partner or ex-partner?: No    Family History  Problem Relation Age of Onset   Lung cancer Mother        smoker; deceased 36   Heart attack Father        deceased 38   Melanoma Brother 77       currently 50   Prostate cancer Brother 37       currently 73   Melanoma Son 45       below left armpit; currently 30   Breast cancer Neg Hx      Current Outpatient Medications:    amLODipine (NORVASC) 2.5 MG tablet, Take 2.5 mg by mouth., Disp: , Rfl:    blood glucose meter kit and supplies, USE TWICE DAILY, Disp: , Rfl:    cyanocobalamin  (VITAMIN B12) 1000 MCG tablet, Take 2,000 mcg by mouth daily., Disp: , Rfl:    dexamethasone  (DECADRON ) 4 MG tablet, Take 2 tablets (8 mg) by mouth daily x 3 days starting the day after cisplatin  chemotherapy. Take with food., Disp: 30 tablet, Rfl: 1   docusate sodium  (COLACE) 100 MG capsule, Take 100 mg by mouth daily., Disp: , Rfl:    furosemide  (LASIX ) 20 MG tablet, Take 20 mg by mouth daily., Disp: , Rfl:    gabapentin  (NEURONTIN ) 300 MG capsule, Take 300 mg by mouth at bedtime. , Disp: , Rfl:    glucose blood test strip, , Disp: , Rfl:    HYDROcodone -acetaminophen  (NORCO/VICODIN) 5-325 MG tablet, Take 0.5-1 tablets by mouth every 6 (six) hours as needed for moderate pain (pain score 4-6)., Disp: 8 tablet, Rfl: 0   lidocaine -prilocaine  (EMLA ) cream, Apply to affected area once, Disp: 30 g, Rfl: 3   magnesium  oxide (MAG-OX) 400 (240 Mg) MG tablet, Take 400 mg by mouth daily., Disp: , Rfl:    MELATONIN PO, Take 12 mg by mouth at bedtime. , Disp: , Rfl:    Methylcellulose, Laxative,  (CITRUCEL PO), Take 2 tablets by mouth daily., Disp: , Rfl:    Multiple Vitamins-Minerals (MULTIVITAMIN ADULT PO), Take 1 tablet by mouth daily. ,  Disp: , Rfl:    ondansetron  (ZOFRAN ) 8 MG tablet, Take 1 tablet (8 mg total) by mouth every 8 (eight) hours as needed for nausea or vomiting. Start on the third day after cisplatin ., Disp: 30 tablet, Rfl: 1   OZEMPIC, 2 MG/DOSE, 8 MG/3ML SOPN, Inject 1 mg into the skin once a week., Disp: , Rfl:    pantoprazole  (PROTONIX ) 20 MG tablet, Take 1 tablet (20 mg total) by mouth daily., Disp: 30 tablet, Rfl: 2   pioglitazone (ACTOS) 30 MG tablet, Take 30 mg by mouth daily., Disp: , Rfl:    potassium chloride  (KLOR-CON  M10) 10 MEQ tablet, Take 1 tablet by mouth daily., Disp: , Rfl:    prochlorperazine  (COMPAZINE ) 10 MG tablet, Take 1 tablet (10 mg total) by mouth every 6 (six) hours as needed (Nausea or vomiting)., Disp: 30 tablet, Rfl: 1   rosuvastatin (CRESTOR) 40 MG tablet, Take 40 mg by mouth at bedtime., Disp: , Rfl:    traZODone  (DESYREL ) 50 MG tablet, Take 50 mg by mouth at bedtime., Disp: , Rfl:    trospium  (SANCTURA ) 20 MG tablet, Take 1 tablet (20 mg total) by mouth 2 (two) times daily as needed (frequency,urgency,bladder spasm)., Disp: 20 tablet, Rfl: 0   TRUEplus Lancets 28G MISC, TEST BLOOD SUGAR AS DIRECTED, Disp: , Rfl:    aspirin EC 81 MG tablet, Take 81 mg by mouth daily. Swallow whole. (Patient not taking: Reported on 11/17/2024), Disp: , Rfl:  No current facility-administered medications for this visit.  Facility-Administered Medications Ordered in Other Visits:    0.9 %  sodium chloride  infusion, , Intravenous, Continuous, Melanee Annah BROCKS, MD, Stopped at 10/27/24 1309   0.9 %  sodium chloride  infusion, , Intravenous, Continuous, Melanee Annah BROCKS, MD, Last Rate: 10 mL/hr at 11/17/24 1104, New Bag at 11/17/24 1104   CISplatin  (PLATINOL ) 72 mg in sodium chloride  0.9 % 250 mL chemo infusion, 35 mg/m2 (Treatment Plan Recorded), Intravenous, Once, Melanee Annah BROCKS, MD, Last Rate: 322 mL/hr at 11/17/24 1148, 72 mg at 11/17/24 1148  Physical exam:  Vitals:   11/17/24 0839 11/17/24 0847  BP: (!) 154/73 137/66  Pulse: 86   Resp: 16   Temp: 97.7 F (36.5 C)   TempSrc: Tympanic   SpO2: 100%   Weight: 210 lb (95.3 kg)    Physical Exam Cardiovascular:     Rate and Rhythm: Normal rate and regular rhythm.     Heart sounds: Murmur heard.  Pulmonary:     Effort: Pulmonary effort is normal.     Breath sounds: Normal breath sounds.  Skin:    General: Skin is warm and dry.  Neurological:     Mental Status: She is alert and oriented to person, place, and time.      I have personally reviewed labs listed below:    Latest Ref Rng & Units 11/17/2024    8:25 AM  CMP  Glucose 70 - 99 mg/dL 845   BUN 8 - 23 mg/dL 15   Creatinine 9.55 - 1.00 mg/dL 9.28   Sodium 864 - 854 mmol/L 142   Potassium 3.5 - 5.1 mmol/L 3.6   Chloride 98 - 111 mmol/L 104   CO2 22 - 32 mmol/L 27   Calcium 8.9 - 10.3 mg/dL 9.4   Total Protein 6.5 - 8.1 g/dL 6.0   Total Bilirubin 0.0 - 1.2 mg/dL 0.4   Alkaline Phos 38 - 126 U/L 73   AST 15 - 41 U/L 17  ALT 0 - 44 U/L 10       Latest Ref Rng & Units 11/17/2024    8:25 AM  CBC  WBC 4.0 - 10.5 K/uL 6.0   Hemoglobin 12.0 - 15.0 g/dL 9.8   Hematocrit 63.9 - 46.0 % 30.4   Platelets 150 - 400 K/uL 291    I have personally reviewed Radiology images listed below: No images are attached to the encounter.  DG Knee Complete 4 Views Right Result Date: 11/15/2024 CLINICAL DATA:  Knee pain, fall EXAM: RIGHT KNEE - COMPLETE 4+ VIEW COMPARISON:  04/03/2008 FINDINGS: Right knee tricompartmental osteoarthritis with joint space loss, sclerosis and osteophyte formation. Medial compartment most affected. Normal alignment without acute osseous finding, fracture, or large effusion. Extremity subcutaneous edema noted. Obese body habitus. IMPRESSION: Right knee tricompartmental osteoarthritis. No acute finding by plain radiography.  Electronically Signed   By: CHRISTELLA.  Shick M.D.   On: 11/15/2024 10:54     Assessment and plan- Patient is a 78 y.o. female with history of T3 N0 upper urothelial tract carcinoma here for on treatment assessment prior to cycle 4-day 1 of gemcitabine  cisplatin  chemotherapy  Counts okay to proceed with cycle 4-day 1 of gemcitabine  cisplatin  chemotherapy today.  She will directly proceed for cycle 4-day 8 of treatment next week which would be her last neoadjuvant cycle for now.  She has repeat scans planned at [facility] on 12/08/2024.  She also has surgery tentatively planned for 12/15/2024.  Cardiology workup ongoing  I will see her back in end of January to discuss final pathology results and further management  History of stage I left breast cancer: S/p left mastectomy and 5 years of endocrine therapy currently in remission.  She also has an upcoming mammogram coming up this month   Visit Diagnosis 1. Urothelial cancer (HCC)   2. Malignant neoplasm of left female breast, unspecified estrogen receptor status, unspecified site of breast (HCC)   3. Encounter for antineoplastic chemotherapy      Dr. Annah Skene, MD, MPH [facility] [phone] 11/17/2024 12:46 PM

## 2024-11-17 NOTE — Patient Instructions (Signed)
 [facility] - A DEPT OF [facility]  Discharge Instructions: Thank you for choosing [facility] to provide your oncology and hematology care.  If you have a lab appointment with the Cancer Center, please go directly to the Cancer Center and check in at the registration area.  Wear comfortable clothing and clothing appropriate for easy access to any Portacath or PICC line.   We strive to give you quality time with your provider. You may need to reschedule your appointment if you arrive late (15 or more minutes).  Arriving late affects you and other patients whose appointments are after yours.  Also, if you miss three or more appointments without notifying the office, you may be dismissed from the clinic at the provider's discretion.      For prescription refill requests, have your pharmacy contact our office and allow 72 hours for refills to be completed.    Today you received the following chemotherapy and/or immunotherapy agents GEMZAR and CISPLATIN      To help prevent nausea and vomiting after your treatment, we encourage you to take your nausea medication as directed.  BELOW ARE SYMPTOMS THAT SHOULD BE REPORTED IMMEDIATELY: *FEVER GREATER THAN 100.4 F (38 C) OR HIGHER *CHILLS OR SWEATING *NAUSEA AND VOMITING THAT IS NOT CONTROLLED WITH YOUR NAUSEA MEDICATION *UNUSUAL SHORTNESS OF BREATH *UNUSUAL BRUISING OR BLEEDING *URINARY PROBLEMS (pain or burning when urinating, or frequent urination) *BOWEL PROBLEMS (unusual diarrhea, constipation, pain near the anus) TENDERNESS IN MOUTH AND THROAT WITH OR WITHOUT PRESENCE OF ULCERS (sore throat, sores in mouth, or a toothache) UNUSUAL RASH, SWELLING OR PAIN  UNUSUAL VAGINAL DISCHARGE OR ITCHING   Items with * indicate a potential emergency and should be followed up as soon as possible or go to the Emergency Department if any problems should occur.  Please show the CHEMOTHERAPY ALERT CARD or  IMMUNOTHERAPY ALERT CARD at check-in to the Emergency Department and triage nurse.  Should you have questions after your visit or need to cancel or reschedule your appointment, please contact [facility] - A DEPT OF [facility]  [phone] and follow the prompts.  Office hours are 8:00 a.m. to 4:30 p.m. Monday - Friday. Please note that voicemails left after 4:00 p.m. may not be returned until the following business day.  We are closed weekends and major holidays. You have access to a nurse at all times for urgent questions. Please call the main number to the clinic [phone] and follow the prompts.  For any non-urgent questions, you may also contact your provider using MyChart. We now offer e-Visits for anyone 106 and older to request care online for non-urgent symptoms. For details visit [email or web address].   Also download the MyChart app! Go to the app store, search "MyChart", open the app, select [facility], and log in with your MyChart username and password.  Gemcitabine Injection What is this medication? GEMCITABINE (jem SYE ta been) treats some types of cancer. It works by slowing down the growth of cancer cells. This medicine may be used for other purposes; ask your health care provider or pharmacist if you have questions. COMMON BRAND NAME(S): Gemzar, Infugem What should I tell my care team before I take this medication? They need to know if you have any of these conditions: Blood disorders Infection Kidney disease Liver disease Lung or breathing disease, such as asthma or COPD Recent or ongoing radiation therapy An unusual  or allergic reaction to gemcitabine, other medications, foods, dyes, or preservatives If you or your partner are pregnant or trying to get pregnant Breast-feeding How should I use this medication? This medication is injected into a vein. It is given by your care team in a hospital or clinic setting. Talk to your care  team about the use of this medication in children. Special care may be needed. Overdosage: If you think you have taken too much of this medicine contact a poison control center or emergency room at once. NOTE: This medicine is only for you. Do not share this medicine with others. What if I miss a dose? Keep appointments for follow-up doses. It is important not to miss your dose. Call your care team if you are unable to keep an appointment. What may interact with this medication? Interactions have not been studied. This list may not describe all possible interactions. Give your health care provider a list of all the medicines, herbs, non-prescription drugs, or dietary supplements you use. Also tell them if you smoke, drink alcohol, or use illegal drugs. Some items may interact with your medicine. What should I watch for while using this medication? Your condition will be monitored carefully while you are receiving this medication. This medication may make you feel generally unwell. This is not uncommon, as chemotherapy can affect healthy cells as well as cancer cells. Report any side effects. Continue your course of treatment even though you feel ill unless your care team tells you to stop. In some cases, you may be given additional medications to help with side effects. Follow all directions for their use. This medication may increase your risk of getting an infection. Call your care team for advice if you get a fever, chills, sore throat, or other symptoms of a cold or flu. Do not treat yourself. Try to avoid being around people who are sick. This medication may increase your risk to bruise or bleed. Call your care team if you notice any unusual bleeding. Be careful brushing or flossing your teeth or using a toothpick because you may get an infection or bleed more easily. If you have any dental work done, tell your dentist you are receiving this medication. Avoid taking medications that contain  aspirin, acetaminophen, ibuprofen, naproxen, or ketoprofen unless instructed by your care team. These medications may hide a fever. Talk to your care team if you or your partner wish to become pregnant or think you might be pregnant. This medication can cause serious birth defects if taken during pregnancy and for 6 months after the last dose. A negative pregnancy test is required before starting this medication. A reliable form of contraception is recommended while taking this medication and for 6 months after the last dose. Talk to your care team about effective forms of contraception. Do not father a child while taking this medication and for 3 months after the last dose. Use a condom while having sex during this time period. Do not breastfeed while taking this medication and for at least 1 week after the last dose. This medication may cause infertility. Talk to your care team if you are concerned about your fertility. What side effects may I notice from receiving this medication? Side effects that you should report to your care team as soon as possible: Allergic reactions--skin rash, itching, hives, swelling of the face, lips, tongue, or throat Capillary leak syndrome--stomach or muscle pain, unusual weakness or fatigue, feeling faint or lightheaded, decrease in the amount of urine,  swelling of the ankles, hands, or feet, trouble breathing Infection--fever, chills, cough, sore throat, wounds that don't heal, pain or trouble when passing urine, general feeling of discomfort or being unwell Liver injury--right upper belly pain, loss of appetite, nausea, light-colored stool, dark yellow or brown urine, yellowing skin or eyes, unusual weakness or fatigue Low red blood cell level--unusual weakness or fatigue, dizziness, headache, trouble breathing Lung injury--shortness of breath or trouble breathing, cough, spitting up blood, chest pain, fever Stomach pain, bloody diarrhea, pale skin, unusual weakness or  fatigue, decrease in the amount of urine, which may be signs of hemolytic uremic syndrome Sudden and severe headache, confusion, change in vision, seizures, which may be signs of posterior reversible encephalopathy syndrome (PRES) Unusual bruising or bleeding Side effects that usually do not require medical attention (report to your care team if they continue or are bothersome): Diarrhea Drowsiness Hair loss Nausea Pain, redness, or swelling with sores inside the mouth or throat Vomiting This list may not describe all possible side effects. Call your doctor for medical advice about side effects. You may report side effects to FDA at 1-800-FDA-1088. Where should I keep my medication? This medication is given in a hospital or clinic. It will not be stored at home. NOTE: This sheet is a summary. It may not cover all possible information. If you have questions about this medicine, talk to your doctor, pharmacist, or health care provider.  2024 Elsevier/Gold Standard (2022-04-07 00:00:00)  Cisplatin Injection What is this medication? CISPLATIN (SIS pla tin) treats some types of cancer. It works by slowing down the growth of cancer cells. This medicine may be used for other purposes; ask your health care provider or pharmacist if you have questions. COMMON BRAND NAME(S): Platinol, Platinol -AQ What should I tell my care team before I take this medication? They need to know if you have any of these conditions: Eye disease, vision problems Hearing problems Kidney disease Low blood counts, such as low white cells, platelets, or red blood cells Tingling of the fingers or toes, or other nerve disorder An unusual or allergic reaction to cisplatin, carboplatin, oxaliplatin, other medications, foods, dyes, or preservatives If you or your partner are pregnant or trying to get pregnant Breast-feeding How should I use this medication? This medication is injected into a vein. It is given by your care  team in a hospital or clinic setting. Talk to your care team about the use of this medication in children. Special care may be needed. Overdosage: If you think you have taken too much of this medicine contact a poison control center or emergency room at once. NOTE: This medicine is only for you. Do not share this medicine with others. What if I miss a dose? Keep appointments for follow-up doses. It is important not to miss your dose. Call your care team if you are unable to keep an appointment. What may interact with this medication? Do not take this medication with any of the following: Live virus vaccines This medication may also interact with the following: Certain antibiotics, such as amikacin, gentamicin, neomycin, polymyxin B, streptomycin, tobramycin, vancomycin Foscarnet This list may not describe all possible interactions. Give your health care provider a list of all the medicines, herbs, non-prescription drugs, or dietary supplements you use. Also tell them if you smoke, drink alcohol, or use illegal drugs. Some items may interact with your medicine. What should I watch for while using this medication? Your condition will be monitored carefully while you are receiving  this medication. You may need blood work done while taking this medication. This medication may make you feel generally unwell. This is not uncommon, as chemotherapy can affect healthy cells as well as cancer cells. Report any side effects. Continue your course of treatment even though you feel ill unless your care team tells you to stop. This medication may increase your risk of getting an infection. Call your care team for advice if you get a fever, chills, sore throat, or other symptoms of a cold or flu. Do not treat yourself. Try to avoid being around people who are sick. Avoid taking medications that contain aspirin, acetaminophen, ibuprofen, naproxen, or ketoprofen unless instructed by your care team. These medications  may hide a fever. This medication may increase your risk to bruise or bleed. Call your care team if you notice any unusual bleeding. Be careful brushing or flossing your teeth or using a toothpick because you may get an infection or bleed more easily. If you have any dental work done, tell your dentist you are receiving this medication. Drink fluids as directed while you are taking this medication. This will help protect your kidneys. Call your care team if you get diarrhea. Do not treat yourself. Talk to your care team if you or your partner wish to become pregnant or think you might be pregnant. This medication can cause serious birth defects if taken during pregnancy and for 14 months after the last dose. A negative pregnancy test is required before starting this medication. A reliable form of contraception is recommended while taking this medication and for 14 months after the last dose. Talk to your care team about effective forms of contraception. Do not father a child while taking this medication and for 11 months after the last dose. Use a condom during sex during this time period. Do not breast-feed while taking this medication. This medication may cause infertility. Talk to your care team if you are concerned about your fertility. What side effects may I notice from receiving this medication? Side effects that you should report to your care team as soon as possible: Allergic reactions--skin rash, itching, hives, swelling of the face, lips, tongue, or throat Eye pain, change in vision, vision loss Hearing loss, ringing in ears Infection--fever, chills, cough, sore throat, wounds that don't heal, pain or trouble when passing urine, general feeling of discomfort or being unwell Kidney injury--decrease in the amount of urine, swelling of the ankles, hands, or feet Low red blood cell level--unusual weakness or fatigue, dizziness, headache, trouble breathing Painful swelling, warmth, or redness  of the skin, blisters or sores at the infusion site Pain, tingling, or numbness in the hands or feet Unusual bruising or bleeding Side effects that usually do not require medical attention (report to your care team if they continue or are bothersome): Hair loss Nausea Vomiting This list may not describe all possible side effects. Call your doctor for medical advice about side effects. You may report side effects to FDA at 1-800-FDA-1088. Where should I keep my medication? This medication is given in a hospital or clinic. It will not be stored at home. NOTE: This sheet is a summary. It may not cover all possible information. If you have questions about this medicine, talk to your doctor, pharmacist, or health care provider.  2024 Elsevier/Gold Standard (2022-04-03 00:00:00)

## 2024-11-19 ENCOUNTER — Other Ambulatory Visit: Payer: Self-pay

## 2024-11-24 ENCOUNTER — Inpatient Hospital Stay

## 2024-11-24 VITALS — BP 138/54 | HR 75 | Temp 98.4°F | Resp 18 | Wt 208.3 lb

## 2024-11-24 DIAGNOSIS — C689 Malignant neoplasm of urinary organ, unspecified: Secondary | ICD-10-CM

## 2024-11-24 DIAGNOSIS — Z5111 Encounter for antineoplastic chemotherapy: Secondary | ICD-10-CM | POA: Diagnosis not present

## 2024-11-24 LAB — CMP (CANCER CENTER ONLY)
ALT: 9 U/L (ref 0–44)
AST: 16 U/L (ref 15–41)
Albumin: 4.1 g/dL (ref 3.5–5.0)
Alkaline Phosphatase: 88 U/L (ref 38–126)
Anion gap: 12 (ref 5–15)
BUN: 14 mg/dL (ref 8–23)
CO2: 28 mmol/L (ref 22–32)
Calcium: 9 mg/dL (ref 8.9–10.3)
Chloride: 99 mmol/L (ref 98–111)
Creatinine: 0.72 mg/dL (ref 0.44–1.00)
GFR, Estimated: >60 mL/min (ref >60)
Glucose, Bld: 180 mg/dL — ABNORMAL HIGH (ref 70–99)
Potassium: 3.6 mmol/L (ref 3.5–5.1)
Sodium: 139 mmol/L (ref 135–145)
Total Bilirubin: 0.6 mg/dL (ref 0.0–1.2)
Total Protein: 6 g/dL — ABNORMAL LOW (ref 6.5–8.1)

## 2024-11-24 LAB — CBC WITH DIFFERENTIAL (CANCER CENTER ONLY)
Abs Immature Granulocytes: 0.04 K/uL (ref 0.00–0.07)
Basophils Absolute: 0 K/uL (ref 0.0–0.1)
Basophils Relative: 0 %
Eosinophils Absolute: 0.1 K/uL (ref 0.0–0.5)
Eosinophils Relative: 1 %
HCT: 29.5 % — ABNORMAL LOW (ref 36.0–46.0)
Hemoglobin: 9.7 g/dL — ABNORMAL LOW (ref 12.0–15.0)
Immature Granulocytes: 1 %
Lymphocytes Relative: 27 %
Lymphs Abs: 1 K/uL (ref 0.7–4.0)
MCH: 31 pg (ref 26.0–34.0)
MCHC: 32.9 g/dL (ref 30.0–36.0)
MCV: 94.2 fL (ref 80.0–100.0)
Monocytes Absolute: 0.4 K/uL (ref 0.1–1.0)
Monocytes Relative: 10 %
Neutro Abs: 2.2 K/uL (ref 1.7–7.7)
Neutrophils Relative %: 61 %
Platelet Count: 179 K/uL (ref 150–400)
RBC: 3.13 MIL/uL — ABNORMAL LOW (ref 3.87–5.11)
RDW: 18 % — ABNORMAL HIGH (ref 11.5–15.5)
WBC Count: 3.6 K/uL — ABNORMAL LOW (ref 4.0–10.5)
nRBC: 0 % (ref 0.0–0.2)

## 2024-11-24 LAB — MAGNESIUM: Magnesium: 1.3 mg/dL — ABNORMAL LOW (ref 1.7–2.4)

## 2024-11-24 MED ORDER — POTASSIUM CHLORIDE IN NACL 20-0.9 MEQ/L-% IV SOLN
Freq: Once | INTRAVENOUS | Status: AC
  Filled 2024-11-24: qty 1000

## 2024-11-24 MED ORDER — SODIUM CHLORIDE 0.9 % IV SOLN
INTRAVENOUS | Status: DC
  Filled 2024-11-24: qty 250

## 2024-11-24 MED ORDER — SODIUM CHLORIDE 0.9 % IV SOLN
35.0000 mg/m2 | Freq: Once | INTRAVENOUS | Status: AC
  Administered 2024-11-24: 72 mg via INTRAVENOUS
  Filled 2024-11-24: qty 72

## 2024-11-24 MED ORDER — APREPITANT 130 MG/18ML IV EMUL
130.0000 mg | Freq: Once | INTRAVENOUS | Status: AC
  Administered 2024-11-24: 130 mg via INTRAVENOUS
  Filled 2024-11-24: qty 18

## 2024-11-24 MED ORDER — DEXAMETHASONE SOD PHOSPHATE PF 10 MG/ML IJ SOLN
10.0000 mg | Freq: Once | INTRAMUSCULAR | Status: AC
  Administered 2024-11-24: 10 mg via INTRAVENOUS

## 2024-11-24 MED ORDER — PALONOSETRON HCL INJECTION 0.25 MG/5ML
0.2500 mg | Freq: Once | INTRAVENOUS | Status: AC
  Administered 2024-11-24: 0.25 mg via INTRAVENOUS
  Filled 2024-11-24: qty 5

## 2024-11-24 MED ORDER — MAGNESIUM SULFATE 4 GM/100ML IV SOLN
4.0000 g | Freq: Once | INTRAVENOUS | Status: AC
  Administered 2024-11-24: 4 g via INTRAVENOUS
  Filled 2024-11-24: qty 100

## 2024-11-24 MED ORDER — SODIUM CHLORIDE 0.9 % IV SOLN
1000.0000 mg/m2 | Freq: Once | INTRAVENOUS | Status: AC
  Administered 2024-11-24: 2052 mg via INTRAVENOUS
  Filled 2024-11-24: qty 53.97

## 2024-11-24 NOTE — Progress Notes (Signed)
 Per dr Melanee  change Magnesium  to 4 grm due to level of 1.3

## 2024-11-27 ENCOUNTER — Inpatient Hospital Stay

## 2024-11-27 DIAGNOSIS — C689 Malignant neoplasm of urinary organ, unspecified: Secondary | ICD-10-CM

## 2024-11-27 DIAGNOSIS — Z5111 Encounter for antineoplastic chemotherapy: Secondary | ICD-10-CM | POA: Diagnosis not present

## 2024-11-27 MED ORDER — PEGFILGRASTIM-CBQV 6 MG/0.6ML ~~LOC~~ SOSY
6.0000 mg | PREFILLED_SYRINGE | Freq: Once | SUBCUTANEOUS | Status: AC
  Administered 2024-11-27: 14:00:00 6 mg via SUBCUTANEOUS
  Filled 2024-11-27: qty 0.6

## 2024-12-11 ENCOUNTER — Encounter

## 2024-12-13 ENCOUNTER — Other Ambulatory Visit: Payer: Self-pay | Admitting: Oncology

## 2024-12-13 DIAGNOSIS — C689 Malignant neoplasm of urinary organ, unspecified: Secondary | ICD-10-CM

## 2024-12-27 ENCOUNTER — Ambulatory Visit
Admission: RE | Admit: 2024-12-27 | Discharge: 2024-12-27 | Disposition: A | Discharge status: Home / Self Care | Source: Ambulatory Visit | Attending: Physician Assistant | Admitting: Physician Assistant

## 2024-12-27 ENCOUNTER — Other Ambulatory Visit: Payer: Self-pay | Admitting: Physician Assistant

## 2024-12-27 ENCOUNTER — Encounter: Payer: Self-pay | Admitting: Oncology

## 2024-12-27 DIAGNOSIS — M7989 Other specified soft tissue disorders: Secondary | ICD-10-CM | POA: Insufficient documentation

## 2025-01-09 ENCOUNTER — Inpatient Hospital Stay: Admitting: Oncology

## 2025-01-09 ENCOUNTER — Encounter: Payer: Self-pay | Admitting: Oncology

## 2025-01-09 ENCOUNTER — Inpatient Hospital Stay: Attending: Oncology

## 2025-01-22 ENCOUNTER — Inpatient Hospital Stay: Admitting: Oncology

## 2025-01-22 ENCOUNTER — Inpatient Hospital Stay: Attending: Oncology

## 2025-02-07 ENCOUNTER — Encounter
